# Patient Record
Sex: Female | Born: 1960
Health system: Southern US, Community
[De-identification: ages and names within clinical notes are randomized; demographics above are authoritative.]

## PROBLEM LIST (undated history)

## (undated) DIAGNOSIS — M130 Polyarthritis, unspecified: Secondary | ICD-10-CM

## (undated) DIAGNOSIS — R569 Unspecified convulsions: Secondary | ICD-10-CM

## (undated) DIAGNOSIS — F419 Anxiety disorder, unspecified: Secondary | ICD-10-CM

## (undated) DIAGNOSIS — M1990 Unspecified osteoarthritis, unspecified site: Secondary | ICD-10-CM

## (undated) DIAGNOSIS — B9689 Other specified bacterial agents as the cause of diseases classified elsewhere: Secondary | ICD-10-CM

## (undated) DIAGNOSIS — K449 Diaphragmatic hernia without obstruction or gangrene: Secondary | ICD-10-CM

## (undated) DIAGNOSIS — I639 Cerebral infarction, unspecified: Secondary | ICD-10-CM

## (undated) DIAGNOSIS — N760 Acute vaginitis: Secondary | ICD-10-CM

## (undated) DIAGNOSIS — R109 Unspecified abdominal pain: Secondary | ICD-10-CM

## (undated) DIAGNOSIS — E1142 Type 2 diabetes mellitus with diabetic polyneuropathy: Secondary | ICD-10-CM

## (undated) DIAGNOSIS — G8929 Other chronic pain: Secondary | ICD-10-CM

## (undated) DIAGNOSIS — K219 Gastro-esophageal reflux disease without esophagitis: Secondary | ICD-10-CM

## (undated) DIAGNOSIS — E785 Hyperlipidemia, unspecified: Secondary | ICD-10-CM

## (undated) DIAGNOSIS — K279 Peptic ulcer, site unspecified, unspecified as acute or chronic, without hemorrhage or perforation: Secondary | ICD-10-CM

## (undated) DIAGNOSIS — N898 Other specified noninflammatory disorders of vagina: Secondary | ICD-10-CM

## (undated) DIAGNOSIS — E039 Hypothyroidism, unspecified: Secondary | ICD-10-CM

## (undated) DIAGNOSIS — I10 Essential (primary) hypertension: Secondary | ICD-10-CM

## (undated) DIAGNOSIS — A599 Trichomoniasis, unspecified: Secondary | ICD-10-CM

## (undated) DIAGNOSIS — J449 Chronic obstructive pulmonary disease, unspecified: Secondary | ICD-10-CM

## (undated) HISTORY — DX: Chronic obstructive pulmonary disease, unspecified: J44.9

## (undated) HISTORY — PX: BLADDER REPAIR: SHX76

## (undated) HISTORY — DX: Cerebral infarction, unspecified: I63.9

## (undated) HISTORY — DX: Other specified noninflammatory disorders of vagina: N89.8

## (undated) HISTORY — DX: Type 2 diabetes mellitus with diabetic polyneuropathy: E11.42

## (undated) HISTORY — DX: Other specified bacterial agents as the cause of diseases classified elsewhere: B96.89

## (undated) HISTORY — DX: Acute vaginitis: N76.0

## (undated) HISTORY — DX: Gastro-esophageal reflux disease without esophagitis: K21.9

## (undated) HISTORY — PX: ESOPHAGOGASTRODUODENOSCOPY: SHX1529

## (undated) HISTORY — DX: Hyperlipidemia, unspecified: E78.5

## (undated) HISTORY — DX: Trichomoniasis, unspecified: A59.9

## (undated) HISTORY — DX: Peptic ulcer, site unspecified, unspecified as acute or chronic, without hemorrhage or perforation: K27.9

## (undated) HISTORY — PX: ABDOMINAL HYSTERECTOMY: SHX81

## (undated) HISTORY — PX: APPENDECTOMY: SHX54

## (undated) HISTORY — PX: TONSILLECTOMY: SUR1361

## (undated) HISTORY — DX: Polyarthritis, unspecified: M13.0

---

## 2000-12-05 ENCOUNTER — Inpatient Hospital Stay (HOSPITAL_COMMUNITY): Admission: EM | Admit: 2000-12-05 | Discharge: 2000-12-08 | Payer: Self-pay | Admitting: Emergency Medicine

## 2001-02-03 ENCOUNTER — Encounter: Payer: Self-pay | Admitting: Internal Medicine

## 2001-02-03 ENCOUNTER — Emergency Department (HOSPITAL_COMMUNITY): Admission: EM | Admit: 2001-02-03 | Discharge: 2001-02-03 | Payer: Self-pay | Admitting: Internal Medicine

## 2001-08-06 ENCOUNTER — Ambulatory Visit (HOSPITAL_COMMUNITY): Admission: RE | Admit: 2001-08-06 | Discharge: 2001-08-06 | Payer: Self-pay | Admitting: Pulmonary Disease

## 2001-10-12 ENCOUNTER — Ambulatory Visit (HOSPITAL_COMMUNITY): Admission: RE | Admit: 2001-10-12 | Discharge: 2001-10-12 | Payer: Self-pay | Admitting: Pulmonary Disease

## 2002-03-27 ENCOUNTER — Emergency Department (HOSPITAL_COMMUNITY): Admission: EM | Admit: 2002-03-27 | Discharge: 2002-03-27 | Payer: Self-pay | Admitting: Emergency Medicine

## 2002-04-01 ENCOUNTER — Encounter: Payer: Self-pay | Admitting: *Deleted

## 2002-04-01 ENCOUNTER — Ambulatory Visit (HOSPITAL_COMMUNITY): Admission: RE | Admit: 2002-04-01 | Discharge: 2002-04-01 | Payer: Self-pay | Admitting: Emergency Medicine

## 2002-04-15 ENCOUNTER — Ambulatory Visit (HOSPITAL_COMMUNITY): Admission: RE | Admit: 2002-04-15 | Discharge: 2002-04-15 | Payer: Self-pay | Admitting: *Deleted

## 2002-04-22 ENCOUNTER — Ambulatory Visit (HOSPITAL_COMMUNITY): Admission: RE | Admit: 2002-04-22 | Discharge: 2002-04-22 | Payer: Self-pay | Admitting: *Deleted

## 2002-04-25 ENCOUNTER — Encounter: Payer: Self-pay | Admitting: Emergency Medicine

## 2002-04-25 ENCOUNTER — Observation Stay (HOSPITAL_COMMUNITY): Admission: EM | Admit: 2002-04-25 | Discharge: 2002-04-26 | Payer: Self-pay | Admitting: Emergency Medicine

## 2002-06-30 ENCOUNTER — Observation Stay (HOSPITAL_COMMUNITY): Admission: RE | Admit: 2002-06-30 | Discharge: 2002-07-01 | Payer: Self-pay | Admitting: Obstetrics & Gynecology

## 2003-04-10 ENCOUNTER — Ambulatory Visit (HOSPITAL_COMMUNITY): Admission: RE | Admit: 2003-04-10 | Discharge: 2003-04-10 | Payer: Self-pay | Admitting: Pulmonary Disease

## 2003-06-16 ENCOUNTER — Ambulatory Visit (HOSPITAL_COMMUNITY): Admission: RE | Admit: 2003-06-16 | Discharge: 2003-06-16 | Payer: Self-pay | Admitting: Pulmonary Disease

## 2003-07-31 ENCOUNTER — Emergency Department (HOSPITAL_COMMUNITY): Admission: EM | Admit: 2003-07-31 | Discharge: 2003-07-31 | Payer: Self-pay | Admitting: Emergency Medicine

## 2003-11-09 ENCOUNTER — Emergency Department (HOSPITAL_COMMUNITY): Admission: EM | Admit: 2003-11-09 | Discharge: 2003-11-09 | Payer: Self-pay | Admitting: Emergency Medicine

## 2004-03-26 ENCOUNTER — Ambulatory Visit (HOSPITAL_COMMUNITY): Admission: RE | Admit: 2004-03-26 | Discharge: 2004-03-26 | Payer: Self-pay | Admitting: Pulmonary Disease

## 2004-04-12 ENCOUNTER — Ambulatory Visit (HOSPITAL_COMMUNITY): Admission: RE | Admit: 2004-04-12 | Discharge: 2004-04-12 | Payer: Self-pay | Admitting: Pulmonary Disease

## 2004-05-07 ENCOUNTER — Ambulatory Visit (HOSPITAL_COMMUNITY): Admission: RE | Admit: 2004-05-07 | Discharge: 2004-05-07 | Payer: Self-pay | Admitting: Pulmonary Disease

## 2004-06-18 ENCOUNTER — Emergency Department (HOSPITAL_COMMUNITY): Admission: EM | Admit: 2004-06-18 | Discharge: 2004-06-19 | Payer: Self-pay | Admitting: Emergency Medicine

## 2004-07-08 ENCOUNTER — Emergency Department (HOSPITAL_COMMUNITY): Admission: EM | Admit: 2004-07-08 | Discharge: 2004-07-08 | Payer: Self-pay | Admitting: Family Medicine

## 2004-07-26 ENCOUNTER — Ambulatory Visit (HOSPITAL_COMMUNITY): Admission: RE | Admit: 2004-07-26 | Discharge: 2004-07-26 | Payer: Self-pay | Admitting: Neurosurgery

## 2004-10-02 ENCOUNTER — Ambulatory Visit: Payer: Self-pay | Admitting: Orthopedic Surgery

## 2005-05-13 ENCOUNTER — Emergency Department (HOSPITAL_COMMUNITY): Admission: EM | Admit: 2005-05-13 | Discharge: 2005-05-13 | Payer: Self-pay | Admitting: Emergency Medicine

## 2005-05-20 ENCOUNTER — Emergency Department (HOSPITAL_COMMUNITY): Admission: EM | Admit: 2005-05-20 | Discharge: 2005-05-20 | Payer: Self-pay | Admitting: Emergency Medicine

## 2005-07-04 ENCOUNTER — Emergency Department (HOSPITAL_COMMUNITY): Admission: EM | Admit: 2005-07-04 | Discharge: 2005-07-05 | Payer: Self-pay | Admitting: *Deleted

## 2005-09-12 ENCOUNTER — Ambulatory Visit (HOSPITAL_COMMUNITY): Admission: RE | Admit: 2005-09-12 | Discharge: 2005-09-12 | Payer: Self-pay | Admitting: Pulmonary Disease

## 2005-11-02 ENCOUNTER — Emergency Department (HOSPITAL_COMMUNITY): Admission: EM | Admit: 2005-11-02 | Discharge: 2005-11-02 | Payer: Self-pay | Admitting: Emergency Medicine

## 2005-11-25 ENCOUNTER — Emergency Department (HOSPITAL_COMMUNITY): Admission: EM | Admit: 2005-11-25 | Discharge: 2005-11-25 | Payer: Self-pay | Admitting: Emergency Medicine

## 2006-02-15 ENCOUNTER — Emergency Department (HOSPITAL_COMMUNITY): Admission: EM | Admit: 2006-02-15 | Discharge: 2006-02-15 | Payer: Self-pay | Admitting: Emergency Medicine

## 2006-06-04 ENCOUNTER — Emergency Department (HOSPITAL_COMMUNITY): Admission: EM | Admit: 2006-06-04 | Discharge: 2006-06-04 | Payer: Self-pay | Admitting: Emergency Medicine

## 2006-07-01 ENCOUNTER — Inpatient Hospital Stay (HOSPITAL_COMMUNITY): Admission: EM | Admit: 2006-07-01 | Discharge: 2006-07-05 | Payer: Self-pay | Admitting: Emergency Medicine

## 2007-09-09 ENCOUNTER — Ambulatory Visit: Payer: Self-pay | Admitting: Psychiatry

## 2007-09-09 ENCOUNTER — Other Ambulatory Visit: Payer: Self-pay | Admitting: Emergency Medicine

## 2007-09-10 ENCOUNTER — Inpatient Hospital Stay (HOSPITAL_COMMUNITY): Admission: RE | Admit: 2007-09-10 | Discharge: 2007-09-21 | Payer: Self-pay | Admitting: Psychiatry

## 2007-09-11 ENCOUNTER — Encounter (HOSPITAL_COMMUNITY): Payer: Self-pay | Admitting: Psychiatry

## 2008-12-24 ENCOUNTER — Emergency Department (HOSPITAL_COMMUNITY): Admission: EM | Admit: 2008-12-24 | Discharge: 2008-12-24 | Payer: Self-pay | Admitting: Emergency Medicine

## 2009-02-02 ENCOUNTER — Emergency Department (HOSPITAL_COMMUNITY): Admission: EM | Admit: 2009-02-02 | Discharge: 2009-02-02 | Payer: Self-pay | Admitting: Emergency Medicine

## 2010-02-14 ENCOUNTER — Emergency Department (HOSPITAL_COMMUNITY): Admission: EM | Admit: 2010-02-14 | Discharge: 2010-02-14 | Payer: Self-pay | Admitting: Emergency Medicine

## 2010-03-26 ENCOUNTER — Ambulatory Visit: Payer: Self-pay | Admitting: Cardiovascular Disease

## 2010-03-26 ENCOUNTER — Encounter (INDEPENDENT_AMBULATORY_CARE_PROVIDER_SITE_OTHER): Payer: Self-pay | Admitting: *Deleted

## 2010-03-26 DIAGNOSIS — R079 Chest pain, unspecified: Secondary | ICD-10-CM

## 2010-03-26 DIAGNOSIS — F17200 Nicotine dependence, unspecified, uncomplicated: Secondary | ICD-10-CM

## 2010-03-26 DIAGNOSIS — R0602 Shortness of breath: Secondary | ICD-10-CM

## 2010-03-26 DIAGNOSIS — E782 Mixed hyperlipidemia: Secondary | ICD-10-CM

## 2010-03-27 ENCOUNTER — Encounter: Payer: Self-pay | Admitting: Cardiovascular Disease

## 2010-04-23 ENCOUNTER — Encounter (INDEPENDENT_AMBULATORY_CARE_PROVIDER_SITE_OTHER): Payer: Self-pay | Admitting: *Deleted

## 2010-04-28 ENCOUNTER — Emergency Department (HOSPITAL_COMMUNITY): Admission: EM | Admit: 2010-04-28 | Discharge: 2010-04-29 | Payer: Self-pay | Admitting: Emergency Medicine

## 2010-05-06 ENCOUNTER — Emergency Department (HOSPITAL_COMMUNITY): Admission: EM | Admit: 2010-05-06 | Discharge: 2010-05-06 | Payer: Self-pay | Admitting: Emergency Medicine

## 2010-05-23 ENCOUNTER — Encounter: Payer: Self-pay | Admitting: Cardiovascular Disease

## 2010-08-25 ENCOUNTER — Emergency Department (HOSPITAL_COMMUNITY)
Admission: EM | Admit: 2010-08-25 | Discharge: 2010-08-25 | Payer: Self-pay | Source: Home / Self Care | Admitting: Emergency Medicine

## 2010-10-12 ENCOUNTER — Encounter: Payer: Self-pay | Admitting: Pulmonary Disease

## 2010-10-12 ENCOUNTER — Encounter: Payer: Self-pay | Admitting: Orthopedic Surgery

## 2010-10-13 ENCOUNTER — Encounter: Payer: Self-pay | Admitting: Cardiovascular Disease

## 2010-10-13 ENCOUNTER — Encounter: Payer: Self-pay | Admitting: Pulmonary Disease

## 2010-10-13 ENCOUNTER — Encounter: Payer: Self-pay | Admitting: Obstetrics & Gynecology

## 2010-10-22 NOTE — Letter (Signed)
Summary: DR ZOXWR OFFICE NOTE 05/22/10  DR UEAVW OFFICE NOTE 05/22/10   Imported By: Faythe Ghee 05/23/2010 09:45:45  _____________________________________________________________________  External Attachment:    Type:   Image     Comment:   External Document

## 2010-10-22 NOTE — Letter (Signed)
Summary: labs dr. Lucianne Muss 03/19/10  labs dr. Lucianne Muss 03/19/10   Imported By: Faythe Ghee 03/27/2010 12:33:18  _____________________________________________________________________  External Attachment:    Type:   Image     Comment:   External Document

## 2010-10-22 NOTE — Letter (Signed)
Summary: [facility]  [address], [place] [address]   Phone: [phone]  Fax: [phone]     March 26, 2010 MRN: [id number]   EMIKO OSORTO [address] [place], [place]  [address]      YOUR LAB WORK IS DUE   June 26, 2010  Please go to [facility], located across the street from [facility] on the second floor.  Hours are Monday - Friday 7am until 7:30pm         Saturday 8am until 12noon    _X_  DO NOT EAT OR DRINK AFTER MIDNIGHT EVENING PRIOR TO LABWORK  __ YOUR LABWORK IS NOT FASTING --YOU MAY EAT PRIOR TO LABWORK

## 2010-10-22 NOTE — Letter (Signed)
Summary: DIABETES CONSULT  DIABETES CONSULT   Imported By: Faythe Ghee 03/27/2010 12:34:12  _____________________________________________________________________  External Attachment:    Type:   Image     Comment:   External Document

## 2010-10-22 NOTE — Letter (Signed)
Summary: Appointment - Missed  [facility] [place]  [address] [place] [address]   Phone: [phone]  Fax: [phone]     April 23, 2010 MRN: [id number]   GIZZELLE LACOMB [address] [place], [place]  [address]   Dear Ms. Villafranca,  Our records indicate you missed your appointment on Wednesday April 17, 2010 for Exercise Myoview.  It is very important that we reach you to reschedule this appointment. We look forward to participating in your health care needs. Please contact us at the number listed above at your earliest convenience to reschedule this appointment.     Sincerely,    Glass blower/designer

## 2010-10-22 NOTE — Letter (Signed)
Summary: [place] [facility] (Nuc Med Stress)  [facility]  [address], [place] [address]   Phone: [phone]  Fax: [phone]    Nuclear Medicine 1-Day Stress Test Information Sheet  Re:     [name]   DOB:     09-Jan-1961 MRN:     [id number] Weight:  Appointment Date: Register at: Appointment Time: Referring MD:  __x_Exercise Stress  __Adenosine   __Dobutamine  __Lexiscan  __Persantine   __Thallium  Urgency: ____1 (next day)   ____2 (one week)    ____3 (PRN)  Patient will receive Follow Up call with results: Patient needs follow-up appointment:  Instructions regarding medication:  How to prepare for your stress test: 1.NOTHING TO EAT OR DRINK AFTER MIDNIGHT ,DO NOT TAKE DIABETIC AM MEDS  2. DO NOT use any tobacco products for at leaset 8 hours prior to arrival. 3. DO NOT wear dresses or any clothing that may have metal clasps or buttons. 4. Wear short sleeve shirts, loose clothing, and comfortalbe walking shoes. 5. DO NOT use lotions, oils or powder on your chest before the test. 6. The test will take approximately 3-4 hours from the time you arrive until completion. 7. To register the day of the test, go to the Short Stay entrance at [facility] 8. If you must cancel your test, call [phone] as soon as you are aware.  After you arrive for test:   When you arrive at [facility], you will go to Short Stay to be registered. They will then send you to Radiology to check in. The Nuclear Medicine Tech will get you and start an IV in your arm or hand. A small amount of a radioactive tracer will then be injected into your IV. This tracer will then have to circulate for 30-45 minutes. During this time you will wait in the waiting room and you will be able to drink something without caffeine. A series of pictures will be taken of your heart follwoing this waiting period. After the 1st set of pictures you will go to the stress lab to get  ready for your stress test. During the stress test, another small amount of a radioactive tracer will be injected through your IV. When the stress test is complete, there is a short rest period while your heart rate and blood pressure will be monitored. When this monitoring period is complete you will have another set of pictrues taken. (The same as the 1st set of pictures). These pictures are taken between 15 minutes and 1 hour after the stress test. The time depends on the type of stress test you had. Your doctor will inform you of your test results within 7 days after test.    The possibilities of certain changes are possible during the test. They include abnormal blood pressure and disorders of the heart. Side effects of persantine or adenosine can include flushing, chest pain, shortness of breath, stomach tightness, headache and light-headedness. These side effects usually do not last long and are self-resolving. Every effort will be made to keep you comfortable and to minimize complications by obtaining a medical history and by close observation during the test. Emergency equipment, medications, and trained personnel are available to deal with any unusual situation which may arise.  Please notify office at least 48 hours in advance if you are unable to keep this appt.

## 2010-10-22 NOTE — Assessment & Plan Note (Signed)
Summary: NP6 CHERST PAIN   Visit Type:  Initial Consult Referring Provider:  Ines Bloomer Primary Provider:  Dr.Kumar  CC:  chest pain.  History of Present Illness: [name] is seen today at the request of Dr Lucianne Muss.  She is a smoker with elevated lipids, poorly controlled diabetes and chest pain.  She had a normal cath in 2002 but no subsequent evaluation.  Pain is atypical being sharp, not always exertional.  Associated with SOB, and likely COPD exacerbation.  Recent ER visity with R/O.  Last A1c was 9.3 and patient indicates poor diet.  Denies pleuritic pain, cough sputum palpitaitons edema or syncope.  LDL 6/11 92 with target in DM being less than 70.  Previously on statin but not now.  Smokes a pack every 2-3 days.  Counseled for less than 10 minutes regarding smoking cessation.  Limited motivatin to quit.  Long tem risk of worsening COPD and vascular disease discussed.  Current Problems (verified): None  Current Medications (verified): 1)  Nexium 40 Mg Cpdr (Esomeprazole Magnesium) .... Take 1 Tab Daily 2)  Alprazolam 1 Mg Tabs (Alprazolam) .... Take As Directed 3)  Depakote 500 Mg Tbec (Divalproex Sodium) .... Take 1 Tab Three Times A Day 4)  Synthroid 150 Mcg Tabs (Levothyroxine Sodium) .... Take 1 Tab Daily 5)  Trazodone Hcl 100 Mg Tabs (Trazodone Hcl) .... Take 1 Tab At Bedtime 6)  Abilify 5 Mg Tabs (Aripiprazole) .... Take 1 Tab Daily 7)  Lantus 100 Unit/ml Soln (Insulin Glargine) .Marland Kitchen.. 15 Units At Two Times A Day 8)  Lexapro 10 Mg Tabs (Escitalopram Oxalate) .... Take 1 Tab Daily 9)  Proair Hfa 108 (90 Base) Mcg/act Aers (Albuterol Sulfate) 10)  Pravastatin Sodium 20 Mg Tabs (Pravastatin Sodium) .... Take One Tablet By Mouth Daily At Bedtime 11)  Humalog Pen 100 Unit/ml Soln (Insulin Lispro (Human)) .... 5 Units Subcutaneously Ssi Three Times A Day 12)  Aspirin 81 Mg Tbec (Aspirin) .... Take One Tablet By Mouth Daily  Allergies (verified): 1)  ! * Phenytoin  Past  History:  Past Medical History: Last updated: 03/21/2010 hypothyroidism diabetes mellitus gerds hypercholesterolemia depression anxiety panic attacks seizure disorder  Past Surgical History: Last updated: 03/21/2010 cardiac cath 12/07/2000  Social History: Last updated: 03/21/2010 Disabled  Tobacco Use - Yes.  Alcohol Use - no Regular Exercise - no Drug Use - no  Review of Systems       Denies fever, malais, weight loss, blurry vision, decreased visual acuity, cough, sputum, SOB, hemoptysis, pleuritic pain, palpitaitons, heartburn, abdominal pain, melena, lower extremity edema, claudication, or rash.   Vital Signs:  Patient profile:   50 year old female Height:      63 inches Weight:      157 pounds BMI:     27.91 Pulse rate:   83 / minute BP sitting:   112 / 75  (right arm)  Vitals Entered By: Dreama Saa, CNA (March 26, 2010 1:06 PM)  Physical Exam  General:  Affect appropriate Healthy:  appears stated age HEENT: normal Neck supple with no adenopathy JVP normal no bruits no thyromegaly Lungs clear with no wheezing and good diaphragmatic motion Heart:  S1/S2 no murmur,rub, gallop or click PMI normal Abdomen: benighn, BS positve, no tenderness, no AAA no bruit.  No HSM or HJR Distal pulses intact with no bruits No edema Neuro non-focal Skin warm and dry    Impression & Recommendations:  Problem # 1:  DYSPNEA (ICD-786.05)  Related to lung  disease and smoking.  CXR done last month with no acute problems  Her updated medication list for this problem includes:    Aspirin 81 Mg Tbec (Aspirin) .Marland Kitchen... Take one tablet by mouth daily  Problem # 2:  CHEST PAIN UNSPECIFIED (ICD-786.50)  Atypical but multiple risk factors including poorly conrolled DM.  Stress myovue  Orders: Nuclear Stress Test (Nuc Stress Test)  Her updated medication list for this problem includes:    Aspirin 81 Mg Tbec (Aspirin) .Marland Kitchen... Take one tablet by mouth daily  Problem # 3:   MIXED HYPERLIPIDEMIA (ICD-272.2)  Start statin back.  With DM and multiple CRF';s target LDL is 70 or less  LFT;s in 3 months The following medications were removed from the medication list:    Simvastatin 40 Mg Tabs (Simvastatin) .Marland Kitchen... Take 1 tab daily Her updated medication list for this problem includes:    Pravastatin Sodium 20 Mg Tabs (Pravastatin sodium) .Marland Kitchen... Take one tablet by mouth daily at bedtime  The following medications were removed from the medication list:    Simvastatin 40 Mg Tabs (Simvastatin) .Marland Kitchen... Take 1 tab daily Her updated medication list for this problem includes:    Pravastatin Sodium 20 Mg Tabs (Pravastatin sodium) .Marland Kitchen... Take one tablet by mouth daily at bedtime  Future Orders: T-Lipid Profile ([id number]) ... 06/26/2010  Problem # 4:  SMOKER (ICD-305.1) F/U primary.  Reasonable candidate for Welburin.  Patient Instructions: 1)  Your physician recommends that you schedule a follow-up appointment in: 1 year 2)  Your physician recommends that you return for lab work in: 3 monhts 3)  Your physician has recommended you make the following change in your medication: pravastatin 20mg  daily 4)  Your physician has requested that you have an exercise stress myoview.  For further information please visit [email or web address]  Please follow instruction sheet, as given. Prescriptions: PRAVASTATIN SODIUM 20 MG TABS (PRAVASTATIN SODIUM) Take one tablet by mouth daily at bedtime  #30 x 3   Entered by:   Teressa Lower RN   Authorized by:   Colon Branch, MD, [facility]   Signed by:   Teressa Lower RN on 03/26/2010   Method used:   Electronically to        [facility] (retail)       [address]       [place], [place]  [address]       Ph: [phone]       Fax: [phone]   RxID:   [id number]   Prevention & Chronic Care Immunizations   Influenza vaccine: Not documented    Tetanus booster: Not documented    Pneumococcal  vaccine: Not documented  Colorectal Screening   Hemoccult: Not documented    Colonoscopy: Not documented  Other Screening   Pap smear: Not documented    Mammogram: Not documented   Smoking status: current  (03/21/2010)  Lipids   Total Cholesterol: Not documented   LDL: Not documented   LDL Direct: Not documented   HDL: Not documented   Triglycerides: Not documented    SGOT (AST): Not documented   SGPT (ALT): Not documented   Alkaline phosphatase: Not documented   Total bilirubin: Not documented  Self-Management Support :    Lipid self-management support: Not documented    EKG Report  Procedure date:  03/26/2010  Findings:      NSR 81 PAC Poor R wave progression

## 2010-12-02 LAB — BASIC METABOLIC PANEL
BUN: 12 mg/dL (ref 6–23)
CO2: 26 mEq/L (ref 19–32)
Calcium: 9.3 mg/dL (ref 8.4–10.5)
Chloride: 110 mEq/L (ref 96–112)
Creatinine, Ser: 0.67 mg/dL (ref 0.4–1.2)
Glucose, Bld: 63 mg/dL — ABNORMAL LOW (ref 70–99)
Sodium: 142 mEq/L (ref 135–145)

## 2010-12-02 LAB — URINALYSIS, ROUTINE W REFLEX MICROSCOPIC
Bilirubin Urine: NEGATIVE
Hgb urine dipstick: NEGATIVE
Nitrite: NEGATIVE
Urobilinogen, UA: 0.2 mg/dL (ref 0.0–1.0)
pH: 5.5 (ref 5.0–8.0)

## 2010-12-02 LAB — CBC
HCT: 38.7 % (ref 36.0–46.0)
Hemoglobin: 13.6 g/dL (ref 12.0–15.0)
MCHC: 35.1 g/dL (ref 30.0–36.0)
MCV: 89.6 fL (ref 78.0–100.0)
RDW: 13.6 % (ref 11.5–15.5)
WBC: 7.8 10*3/uL (ref 4.0–10.5)

## 2010-12-02 LAB — URINE MICROSCOPIC-ADD ON

## 2010-12-02 LAB — URINE CULTURE
Colony Count: 85000
Culture  Setup Time: 201112051240

## 2010-12-02 LAB — DIFFERENTIAL
Basophils Absolute: 0 10*3/uL (ref 0.0–0.1)
Basophils Relative: 1 % (ref 0–1)
Eosinophils Absolute: 0.1 10*3/uL (ref 0.0–0.7)
Eosinophils Relative: 2 % (ref 0–5)
Lymphocytes Relative: 33 % (ref 12–46)
Lymphs Abs: 2.6 10*3/uL (ref 0.7–4.0)

## 2010-12-05 LAB — URINALYSIS, ROUTINE W REFLEX MICROSCOPIC
Nitrite: POSITIVE — AB
Protein, ur: NEGATIVE mg/dL
pH: 6 (ref 5.0–8.0)

## 2010-12-05 LAB — URINE CULTURE
Colony Count: >=100000
Culture  Setup Time: 201108151712

## 2010-12-05 LAB — URINE MICROSCOPIC-ADD ON

## 2010-12-06 LAB — DIFFERENTIAL
Basophils Relative: 1 % (ref 0–1)
Eosinophils Absolute: 0.2 10*3/uL (ref 0.0–0.7)
Eosinophils Relative: 2 % (ref 0–5)
Lymphocytes Relative: 31 % (ref 12–46)
Lymphs Abs: 2.2 10*3/uL (ref 0.7–4.0)
Monocytes Absolute: 0.7 10*3/uL (ref 0.1–1.0)
Monocytes Relative: 10 % (ref 3–12)
Neutro Abs: 4 10*3/uL (ref 1.7–7.7)

## 2010-12-06 LAB — BASIC METABOLIC PANEL
BUN: 11 mg/dL (ref 6–23)
Calcium: 8.7 mg/dL (ref 8.4–10.5)
Chloride: 103 mEq/L (ref 96–112)
Creatinine, Ser: 0.82 mg/dL (ref 0.4–1.2)
GFR calc non Af Amer: >60 mL/min (ref >60)
Glucose, Bld: 290 mg/dL — ABNORMAL HIGH (ref 70–99)
Potassium: 3.8 mEq/L (ref 3.5–5.1)
Sodium: 136 mEq/L (ref 135–145)

## 2010-12-06 LAB — CBC
MCH: 31.5 pg (ref 26.0–34.0)
MCHC: 34.3 g/dL (ref 30.0–36.0)
Platelets: 269 10*3/uL (ref 150–400)
RBC: 4.32 MIL/uL (ref 3.87–5.11)
RDW: 12.9 % (ref 11.5–15.5)

## 2010-12-09 LAB — POCT CARDIAC MARKERS
CKMB, poc: <1 ng/mL — ABNORMAL LOW (ref 1.0–8.0)
Myoglobin, poc: 56 ng/mL (ref 12–200)
Myoglobin, poc: 61.2 ng/mL (ref 12–200)
Troponin i, poc: <0.05 ng/mL (ref 0.00–0.09)

## 2010-12-09 LAB — DIFFERENTIAL
Eosinophils Relative: 1 % (ref 0–5)
Lymphocytes Relative: 29 % (ref 12–46)
Monocytes Absolute: 0.6 10*3/uL (ref 0.1–1.0)
Neutro Abs: 4.6 10*3/uL (ref 1.7–7.7)
Neutrophils Relative %: 62 % (ref 43–77)

## 2010-12-09 LAB — BASIC METABOLIC PANEL
BUN: 8 mg/dL (ref 6–23)
CO2: 26 mEq/L (ref 19–32)
Calcium: 8.8 mg/dL (ref 8.4–10.5)
Chloride: 103 mEq/L (ref 96–112)
GFR calc Af Amer: >60 mL/min (ref >60)
GFR calc non Af Amer: >60 mL/min (ref >60)

## 2010-12-09 LAB — CBC
Hemoglobin: 12.6 g/dL (ref 12.0–15.0)
RDW: 13.5 % (ref 11.5–15.5)

## 2010-12-09 LAB — HEPATIC FUNCTION PANEL
AST: 18 U/L (ref 0–37)
Bilirubin, Direct: 0.1 mg/dL (ref 0.0–0.3)
Indirect Bilirubin: 0.2 mg/dL — ABNORMAL LOW (ref 0.3–0.9)
Total Bilirubin: 0.3 mg/dL (ref 0.3–1.2)

## 2010-12-09 LAB — GLUCOSE, CAPILLARY: Glucose-Capillary: 250 mg/dL — ABNORMAL HIGH (ref 70–99)

## 2010-12-09 LAB — LIPASE, BLOOD: Lipase: 16 U/L (ref 11–59)

## 2010-12-09 LAB — D-DIMER, QUANTITATIVE: D-Dimer, Quant: 0.27 ug/mL-FEU (ref 0.00–0.48)

## 2010-12-24 ENCOUNTER — Emergency Department (HOSPITAL_COMMUNITY): Payer: Medicare Other

## 2010-12-24 ENCOUNTER — Emergency Department (HOSPITAL_COMMUNITY)
Admission: EM | Admit: 2010-12-24 | Discharge: 2010-12-24 | Disposition: A | Discharge status: Home / Self Care | Payer: Medicare Other | Attending: Emergency Medicine | Admitting: Emergency Medicine

## 2010-12-24 DIAGNOSIS — M545 Low back pain, unspecified: Secondary | ICD-10-CM | POA: Insufficient documentation

## 2010-12-24 DIAGNOSIS — E039 Hypothyroidism, unspecified: Secondary | ICD-10-CM | POA: Insufficient documentation

## 2010-12-24 DIAGNOSIS — W108XXA Fall (on) (from) other stairs and steps, initial encounter: Secondary | ICD-10-CM | POA: Insufficient documentation

## 2010-12-24 DIAGNOSIS — T07XXXA Unspecified multiple injuries, initial encounter: Secondary | ICD-10-CM | POA: Insufficient documentation

## 2010-12-24 DIAGNOSIS — R0989 Other specified symptoms and signs involving the circulatory and respiratory systems: Secondary | ICD-10-CM | POA: Insufficient documentation

## 2010-12-24 DIAGNOSIS — Y92009 Unspecified place in unspecified non-institutional (private) residence as the place of occurrence of the external cause: Secondary | ICD-10-CM | POA: Insufficient documentation

## 2010-12-24 DIAGNOSIS — G40909 Epilepsy, unspecified, not intractable, without status epilepticus: Secondary | ICD-10-CM | POA: Insufficient documentation

## 2010-12-24 DIAGNOSIS — E1169 Type 2 diabetes mellitus with other specified complication: Secondary | ICD-10-CM | POA: Insufficient documentation

## 2010-12-24 DIAGNOSIS — R0609 Other forms of dyspnea: Secondary | ICD-10-CM | POA: Insufficient documentation

## 2010-12-24 DIAGNOSIS — M25529 Pain in unspecified elbow: Secondary | ICD-10-CM | POA: Insufficient documentation

## 2010-12-24 LAB — BASIC METABOLIC PANEL
BUN: 15 mg/dL (ref 6–23)
GFR calc Af Amer: 39 mL/min — ABNORMAL LOW (ref >60)
GFR calc non Af Amer: 32 mL/min — ABNORMAL LOW (ref >60)
Potassium: 3.4 mEq/L — ABNORMAL LOW (ref 3.5–5.1)

## 2010-12-24 LAB — GLUCOSE, CAPILLARY: Glucose-Capillary: 475 mg/dL — ABNORMAL HIGH (ref 70–99)

## 2010-12-30 ENCOUNTER — Emergency Department (HOSPITAL_COMMUNITY): Payer: Medicare Other

## 2010-12-30 ENCOUNTER — Inpatient Hospital Stay (HOSPITAL_COMMUNITY)
Admission: EM | Admit: 2010-12-30 | Discharge: 2011-01-05 | DRG: 689 | Disposition: A | Discharge status: Home / Self Care | Payer: Medicare Other | Attending: Emergency Medicine | Admitting: Emergency Medicine

## 2010-12-30 DIAGNOSIS — J441 Chronic obstructive pulmonary disease with (acute) exacerbation: Secondary | ICD-10-CM | POA: Diagnosis present

## 2010-12-30 DIAGNOSIS — J189 Pneumonia, unspecified organism: Secondary | ICD-10-CM | POA: Diagnosis present

## 2010-12-30 DIAGNOSIS — F329 Major depressive disorder, single episode, unspecified: Secondary | ICD-10-CM | POA: Diagnosis present

## 2010-12-30 DIAGNOSIS — K219 Gastro-esophageal reflux disease without esophagitis: Secondary | ICD-10-CM | POA: Diagnosis present

## 2010-12-30 DIAGNOSIS — N390 Urinary tract infection, site not specified: Principal | ICD-10-CM | POA: Diagnosis present

## 2010-12-30 DIAGNOSIS — R339 Retention of urine, unspecified: Secondary | ICD-10-CM | POA: Diagnosis present

## 2010-12-30 DIAGNOSIS — F3289 Other specified depressive episodes: Secondary | ICD-10-CM | POA: Diagnosis present

## 2010-12-30 DIAGNOSIS — E039 Hypothyroidism, unspecified: Secondary | ICD-10-CM | POA: Diagnosis present

## 2010-12-30 DIAGNOSIS — IMO0001 Reserved for inherently not codable concepts without codable children: Secondary | ICD-10-CM | POA: Diagnosis present

## 2010-12-30 DIAGNOSIS — F411 Generalized anxiety disorder: Secondary | ICD-10-CM | POA: Diagnosis present

## 2010-12-30 LAB — GLUCOSE, CAPILLARY
Glucose-Capillary: 414 mg/dL — ABNORMAL HIGH (ref 70–99)
Glucose-Capillary: 169 mg/dL — ABNORMAL HIGH (ref 70–99)
Glucose-Capillary: 365 mg/dL — ABNORMAL HIGH (ref 70–99)

## 2010-12-30 LAB — CBC
MCH: 30.5 pg (ref 26.0–34.0)
MCV: 89.5 fL (ref 78.0–100.0)
Platelets: 237 10*3/uL (ref 150–400)
RDW: 13 % (ref 11.5–15.5)

## 2010-12-30 LAB — BASIC METABOLIC PANEL
BUN: 11 mg/dL (ref 6–23)
Calcium: 8.7 mg/dL (ref 8.4–10.5)
Creatinine, Ser: 0.81 mg/dL (ref 0.4–1.2)
GFR calc non Af Amer: >60 mL/min (ref >60)

## 2010-12-30 LAB — DIFFERENTIAL
Eosinophils Absolute: 0 10*3/uL (ref 0.0–0.7)
Eosinophils Relative: 0 % (ref 0–5)
Lymphs Abs: 1.4 10*3/uL (ref 0.7–4.0)
Monocytes Relative: 11 % (ref 3–12)

## 2010-12-30 LAB — URINALYSIS, ROUTINE W REFLEX MICROSCOPIC
Ketones, ur: 15 mg/dL — AB
Leukocytes, UA: NEGATIVE
Nitrite: POSITIVE — AB
Protein, ur: NEGATIVE mg/dL
pH: 5.5 (ref 5.0–8.0)

## 2010-12-30 LAB — URINE MICROSCOPIC-ADD ON

## 2010-12-31 LAB — CBC
HCT: 38.7 % (ref 36.0–46.0)
Hemoglobin: 13.5 g/dL (ref 12.0–15.0)
RDW: 13.2 % (ref 11.5–15.5)
WBC: 13.8 10*3/uL — ABNORMAL HIGH (ref 4.0–10.5)

## 2010-12-31 LAB — GLUCOSE, CAPILLARY
Glucose-Capillary: 101 mg/dL — ABNORMAL HIGH (ref 70–99)
Glucose-Capillary: 63 mg/dL — ABNORMAL LOW (ref 70–99)
Glucose-Capillary: 132 mg/dL — ABNORMAL HIGH (ref 70–99)

## 2010-12-31 LAB — RAPID URINE DRUG SCREEN, HOSP PERFORMED
Amphetamines: NOT DETECTED
Opiates: NOT DETECTED
Tetrahydrocannabinol: NOT DETECTED

## 2010-12-31 LAB — URINALYSIS, ROUTINE W REFLEX MICROSCOPIC
Nitrite: NEGATIVE
Protein, ur: NEGATIVE mg/dL
Specific Gravity, Urine: <1.005 — ABNORMAL LOW (ref 1.005–1.030)
Urobilinogen, UA: 0.2 mg/dL (ref 0.0–1.0)

## 2010-12-31 LAB — HEPATIC FUNCTION PANEL
AST: 13 U/L (ref 0–37)
Albumin: 2.7 g/dL — ABNORMAL LOW (ref 3.5–5.2)
Bilirubin, Direct: 0.1 mg/dL (ref 0.0–0.3)
Total Protein: 5.2 g/dL — ABNORMAL LOW (ref 6.0–8.3)

## 2010-12-31 LAB — DIFFERENTIAL
Basophils Absolute: 0 10*3/uL (ref 0.0–0.1)
Basophils Relative: 0 % (ref 0–1)
Eosinophils Absolute: 0 10*3/uL (ref 0.0–0.7)
Eosinophils Relative: 0 % (ref 0–5)
Lymphocytes Relative: 9 % — ABNORMAL LOW (ref 12–46)
Lymphs Abs: 1.3 10*3/uL (ref 0.7–4.0)
Monocytes Absolute: 1.1 10*3/uL — ABNORMAL HIGH (ref 0.1–1.0)
Monocytes Relative: 8 % (ref 3–12)
Neutro Abs: 11.3 10*3/uL — ABNORMAL HIGH (ref 1.7–7.7)
Neutrophils Relative %: 83 % — ABNORMAL HIGH (ref 43–77)

## 2010-12-31 LAB — TSH: TSH: 2.205 u[IU]/mL (ref 0.350–4.500)

## 2010-12-31 LAB — COMPREHENSIVE METABOLIC PANEL
ALT: 20 U/L (ref 0–35)
Albumin: 3.1 g/dL — ABNORMAL LOW (ref 3.5–5.2)
Alkaline Phosphatase: 92 U/L (ref 39–117)
BUN: 7 mg/dL (ref 6–23)
Chloride: 101 mEq/L (ref 96–112)
Glucose, Bld: 191 mg/dL — ABNORMAL HIGH (ref 70–99)
Potassium: 3.4 mEq/L — ABNORMAL LOW (ref 3.5–5.1)
Sodium: 134 mEq/L — ABNORMAL LOW (ref 135–145)
Total Bilirubin: 0.7 mg/dL (ref 0.3–1.2)
Total Protein: 6.6 g/dL (ref 6.0–8.3)

## 2010-12-31 LAB — LIPID PANEL
HDL: 46 mg/dL (ref >39)
Total CHOL/HDL Ratio: 2.7 RATIO

## 2010-12-31 LAB — URINE MICROSCOPIC-ADD ON

## 2010-12-31 NOTE — H&P (Signed)
NAMEZERA, MARKWARDT              ACCOUNT NO.:  [id number]  MEDICAL RECORD NO.:  [id number]           PATIENT TYPE:  O  LOCATION:  A337                          FACILITY:  [facility]  PHYSICIAN:  Rosanna Randy, MDDATE OF BIRTH:  Dec 31, 1960  DATE OF ADMISSION:  12/30/2010 DATE OF DISCHARGE:  LH                             HISTORY & PHYSICAL   PRIMARY CARE PHYSICIAN:  [facility], followed by Dr. Vinnie Langton at this practice.  CHIEF COMPLAINT:  Dysuria, fever and cough.  The patient will be followed by [facility].  HISTORY OF PRESENT ILLNESS:  [name] is a 50 year old female with a past medical history significant for diabetes, hypothyroidism, hyperlipidemia, GERD and anxiety who comes into the hospital complaining of nausea, abdominal discomfort in her suprapubic area, dysphoria and also cough.  According to the patient, the symptoms have been going on since last week on Tuesday where she actually was seen in the emergency department, was diagnosed with just hyperglycemia and most likely allergic rhinitis and was discharged home.  The patient reports that her symptoms continue worsening since that period of time to the point that she is now having some difficulty keeping things down and coughing all the time with mild shortness of breath.  The patient reports that on Sunday she had a fever of 103.2.  While in the emergency department, the patient has a chest x-ray that demonstrated changes consistent with COPD and right medial lung base focal changes concerns for pneumonia.  She also have a white blood cells of 10.0 and a urinalysis with a positive nitrite and too numerous to count white blood cells with many bacteria. [facility] was called to admit the patient for further evaluation and treatment what it looks to be a combination of UTI and community-acquired pneumonia.  Of note, the patient was also found to  be hyperglycemic with a blood sugar in the 400 range.  ALLERGIES:  The patient is allergic to Dilantin and the allergic reaction to this medication is a Stevens-Johnson syndrome.  PAST MEDICAL HISTORY:  As mentioned above include hypothyroidism, diabetes, gastroesophageal reflux disease, anxiety, hyperlipidemia, tobacco abuse and COPD that had been diagnosed by x-ray, but no formal PFTs done.  MEDICATIONS:  The patient reports Lantus 18 units twice a day, sliding scale insulin with NovoLog using around 10-15 units three times a day with meals, Synthroid 150 mcg, Nexium 40 mg by mouth daily, Xanax 1 mg four times a day and p.r.n. Tylenol.  The patient did not remember what is the medications that she used for her cholesterol and at the moment of this dictation, doses and medications have not been yet confirmed. Pharmacy is working on the medication reconciliation and as soon as we will go ahead and continue, the patient's home meds.  SOCIAL HISTORY:  The patient lives in [place] with a roommate.  She is divorced.  Have one children, currently on disability secondary to anxiety and diabetes.  She smoked about a pack weekly according to her, sometimes a little bit more and she has been smoking over the last  20 years.  She denies alcohol or illicit drugs.  FAMILY HISTORY:  Father with heart problems, diabetes and hypertension. Her mother with a history of tobacco abuse who had developed lung cancer with metastases to her liver, otherwise noncontributory.  REVIEW OF SYSTEMS:  Positive for dysuria, fever, chills, cough with a whitish sputum production, mild wheezing, otherwise as already mentioned on her HPI.  Of note, the patient denies any sick contacts, any long trips or any new medications.  PHYSICAL EXAM:  VITAL SIGNS:  Temperature 98.6, heart rate 76, respiratory rate 16, blood pressure 107/67, oxygen saturation 91% on room air. GENERAL:  The patient was in no acute distress,  but mildly uncomfortable secondary to general malaise and back pain due to being coughing so much according to her. HEENT:  Normocephalic, atraumatic eyes.  PERRLA.  Extraocular muscles intact.  No icterus.  No nystagmus.  There was no exudates or erythema appreciated.  Inside her mouth, there is a poor dentition with upper dentures in place.  The patient have dry mucous membranes. NECK:  Supple.  No bruits, thyromegaly. RESPIRATORY:  Mild wheezing.  End-expiratory wheezing.  Scattered rhonchi.  Good air movement. HEART:  Regular rate and rhythm.  No murmurs, gallops or rubs. ABDOMEN:  Soft, nontender, nondistended.  Positive bowel sounds. EXTREMITIES:  No edema, cyanosis or clubbing. NEUROLOGIC:  The patient was alert, awake and oriented x3.  Muscle strength 5/5.  Cranial nerves II-XII intact.  Gait was normal.  Normal finger-to-nose.  Deep tendon reflexes light touch and proprioception were intact and normal bilaterally and symmetrically. PSYCH:  Appropriate.  PERTINENT LABORATORY DATA:  The patient has a BMET with a sodium of 134, potassium 3.6, chloride 102, bicarb 24, blood sugar 411, BUN 11, creatinine 0.81, calcium 8.7.  Had a CBC with a white blood cells of 10.0, hemoglobin 13.1, platelets 237.  Urine microscopy showing too numerous to count white blood cells, many bacteria and a urinalysis, which was positive for more than 1000 urine glucose, 15 ketones and positive nitrites.  The patient also had a chest x-ray that demonstrated subtle right median base findings concerns for early pneumonia.  There are also changes in her x-ray that are consistently with hyper-aerated lungs and chronic obstructive pulmonary disease.  ASSESSMENT AND PLAN: 1. UTI.  We are going to provide tx with Rocephin as an antibiotic and     we are going to check the patient's urine culture. 2. Community adquired pneumonia: with the cough, general malaise and     findings on x-ray.  We are going to add  azithromycin and she will     be receiving Rocephin for her urinary tract infection, this will     cover pretty well with community-acquired pneumonia.  The patient     will receive anti-cough (Robitussin).  We are going to use low dose      morphine for pain and while she is febrile, we are     going to put the patient on droplets precautions at this time as     protocol. 3.  DM/hyperglycemia: The patient received a short course of Glucommander      while in the emergency department at this moment her CBG was 250.       We are going to start the patient on a moderate sliding scale insulin.       Will continue with her Lantus 18 units subcutaneously twice a day and will check a     hemoglobin A1c and  depending her CBGs, we will adjust her     medications.  The patient had been placed on a modify carbohydrates diet. 4. Gastroesophageal reflux disease: We are going to continue her Nexium. 5. Hypothyroidism: we are going to check a TSH and we are going to continue her Synthroid. 6. Hx of hyperlipidemia: we are going to check a fasting lipid profile and     depending findings, we are going to start her on statins. 7. Anxiety: plan is to continue using her Xanax as prescribed. 8. Dpression/insomnia: we are going to start the patient on her trazodone 100mg     by mouth daily at bedtime.     Further test workup and treatment will be provided depending improvement to the  patient's symptoms.     Rosanna Randy, MD     CEM/MEDQ  D:  12/30/2010  T:  12/30/2010  Job:  [id number]  cc:   Vinnie Langton, MD  Reather Littler, M.D. Fax: [phone]  Electronically Signed by Vassie Loll MD on 12/31/2010 12:02:17 PM

## 2011-01-01 ENCOUNTER — Inpatient Hospital Stay (HOSPITAL_COMMUNITY): Payer: Medicare Other

## 2011-01-01 LAB — COMPREHENSIVE METABOLIC PANEL
Albumin: 3.4 g/dL — ABNORMAL LOW (ref 3.5–5.2)
BUN: 10 mg/dL (ref 6–23)
Creatinine, Ser: 0.84 mg/dL (ref 0.4–1.2)
Potassium: 3.2 mEq/L — ABNORMAL LOW (ref 3.5–5.1)
Total Protein: 5.9 g/dL — ABNORMAL LOW (ref 6.0–8.3)

## 2011-01-01 LAB — CBC
HCT: 39.4 % (ref 36.0–46.0)
MCH: 30.4 pg (ref 26.0–34.0)
MCHC: 32.9 g/dL (ref 30.0–36.0)
MCV: 92.4 fL (ref 78.0–100.0)
MCV: 92.3 fL (ref 78.0–100.0)
Platelets: 244 10*3/uL (ref 150–400)
Platelets: 210 10*3/uL (ref 150–400)
RBC: 4.34 MIL/uL (ref 3.87–5.11)
RDW: 13.2 % (ref 11.5–15.5)
RDW: 13.6 % (ref 11.5–15.5)

## 2011-01-01 LAB — URINALYSIS, ROUTINE W REFLEX MICROSCOPIC
Ketones, ur: NEGATIVE mg/dL
Leukocytes, UA: NEGATIVE
Nitrite: POSITIVE — AB
Protein, ur: NEGATIVE mg/dL

## 2011-01-01 LAB — BASIC METABOLIC PANEL
BUN: 6 mg/dL (ref 6–23)
Calcium: 9 mg/dL (ref 8.4–10.5)
Chloride: 105 mEq/L (ref 96–112)
Creatinine, Ser: 0.72 mg/dL (ref 0.4–1.2)

## 2011-01-01 LAB — DIFFERENTIAL
Eosinophils Absolute: 0.1 10*3/uL (ref 0.0–0.7)
Eosinophils Relative: 1 % (ref 0–5)
Lymphocytes Relative: 20 % (ref 12–46)
Lymphs Abs: 2.2 10*3/uL (ref 0.7–4.0)
Lymphs Abs: 2 10*3/uL (ref 0.7–4.0)
Monocytes Absolute: 0.8 10*3/uL (ref 0.1–1.0)
Monocytes Absolute: 0.9 10*3/uL (ref 0.1–1.0)
Monocytes Relative: 9 % (ref 3–12)
Monocytes Relative: 10 % (ref 3–12)
Neutro Abs: 6.7 10*3/uL (ref 1.7–7.7)

## 2011-01-01 LAB — LIPASE, BLOOD: Lipase: 14 U/L (ref 11–59)

## 2011-01-01 LAB — GLUCOSE, CAPILLARY
Glucose-Capillary: 153 mg/dL — ABNORMAL HIGH (ref 70–99)
Glucose-Capillary: 390 mg/dL — ABNORMAL HIGH (ref 70–99)
Glucose-Capillary: 86 mg/dL (ref 70–99)
Glucose-Capillary: 91 mg/dL (ref 70–99)

## 2011-01-01 LAB — URINE CULTURE
Colony Count: NO GROWTH
Culture: NO GROWTH

## 2011-01-02 LAB — BASIC METABOLIC PANEL
BUN: 9 mg/dL (ref 6–23)
Chloride: 98 mEq/L (ref 96–112)
Creatinine, Ser: 0.91 mg/dL (ref 0.4–1.2)
GFR calc non Af Amer: >60 mL/min (ref >60)
Glucose, Bld: 257 mg/dL — ABNORMAL HIGH (ref 70–99)

## 2011-01-02 LAB — CBC
MCHC: 33.1 g/dL (ref 30.0–36.0)
MCV: 92 fL (ref 78.0–100.0)
Platelets: 221 10*3/uL (ref 150–400)
RDW: 12.9 % (ref 11.5–15.5)
WBC: 5.4 10*3/uL (ref 4.0–10.5)

## 2011-01-02 LAB — GLUCOSE, CAPILLARY: Glucose-Capillary: 106 mg/dL — ABNORMAL HIGH (ref 70–99)

## 2011-01-02 LAB — DIFFERENTIAL
Basophils Relative: 1 % (ref 0–1)
Eosinophils Absolute: 0.2 10*3/uL (ref 0.0–0.7)
Eosinophils Relative: 3 % (ref 0–5)
Monocytes Relative: 12 % (ref 3–12)
Neutrophils Relative %: 40 % — ABNORMAL LOW (ref 43–77)

## 2011-01-03 LAB — URINALYSIS, ROUTINE W REFLEX MICROSCOPIC
Leukocytes, UA: NEGATIVE
Nitrite: NEGATIVE
Protein, ur: NEGATIVE mg/dL
Specific Gravity, Urine: 1.01 (ref 1.005–1.030)
Urobilinogen, UA: 0.2 mg/dL (ref 0.0–1.0)

## 2011-01-03 LAB — GLUCOSE, CAPILLARY
Glucose-Capillary: 341 mg/dL — ABNORMAL HIGH (ref 70–99)
Glucose-Capillary: 207 mg/dL — ABNORMAL HIGH (ref 70–99)
Glucose-Capillary: 210 mg/dL — ABNORMAL HIGH (ref 70–99)
Glucose-Capillary: 115 mg/dL — ABNORMAL HIGH (ref 70–99)

## 2011-01-03 LAB — URINE MICROSCOPIC-ADD ON

## 2011-01-04 LAB — DIFFERENTIAL
Basophils Absolute: 0.1 10*3/uL (ref 0.0–0.1)
Eosinophils Absolute: 0.1 10*3/uL (ref 0.0–0.7)
Lymphocytes Relative: 45 % (ref 12–46)
Monocytes Relative: 8 % (ref 3–12)
Neutro Abs: 2.8 10*3/uL (ref 1.7–7.7)

## 2011-01-04 LAB — GLUCOSE, CAPILLARY
Glucose-Capillary: 61 mg/dL — ABNORMAL LOW (ref 70–99)
Glucose-Capillary: 160 mg/dL — ABNORMAL HIGH (ref 70–99)
Glucose-Capillary: 332 mg/dL — ABNORMAL HIGH (ref 70–99)

## 2011-01-04 LAB — BASIC METABOLIC PANEL
BUN: 12 mg/dL (ref 6–23)
GFR calc non Af Amer: >60 mL/min (ref >60)
Glucose, Bld: 345 mg/dL — ABNORMAL HIGH (ref 70–99)
Potassium: 4.6 mEq/L (ref 3.5–5.1)

## 2011-01-04 LAB — CBC
Hemoglobin: 13.1 g/dL (ref 12.0–15.0)
MCH: 30.5 pg (ref 26.0–34.0)
MCHC: 33.3 g/dL (ref 30.0–36.0)
RBC: 4.29 MIL/uL (ref 3.87–5.11)
RDW: 12.8 % (ref 11.5–15.5)

## 2011-01-05 LAB — URINE CULTURE
Colony Count: NO GROWTH
Culture  Setup Time: 201204140110

## 2011-01-05 LAB — GLUCOSE, CAPILLARY: Glucose-Capillary: 82 mg/dL (ref 70–99)

## 2011-01-23 NOTE — Discharge Summary (Signed)
[name], [name]              ACCOUNT NO.:  [id number]  MEDICAL RECORD NO.:  [id number]           PATIENT TYPE:  I  LOCATION:  A337                          FACILITY:  [facility]  PHYSICIAN:  Aimar Shrewsbury, DO         DATE OF BIRTH:  05/16/1961  DATE OF ADMISSION:  12/30/2010 DATE OF DISCHARGE:  LH                              DISCHARGE SUMMARY   ADMISSION DIAGNOSES:  Urinary tract infection, community-acquired pneumonia, diabetes mellitus, hyperglycemia, gastroesophageal reflux disease, hypothyroidism, hyperlipidemia, anxiety, depression and insomnia.  HISTORY OF PRESENT ILLNESS:  Please see H and P.  HOSPITAL COURSE:  The patient was admitted.  She was placed on IV Rocephin.  As in December, the patient was found to have a urinary tract infection which was actually grew out Acetobacter which was sensitive to Rocephin.  She was also given Levaquin breathing treatments and sliding scale insulin.  She was continued on her Lantus.  She was given pain control as she had quite a bit of pleuritic chest pain and she was continued on her own home anxiety medications and another medications. The patient has been slow to improve with regard to her wheezing.  She actually sounded quite a bit better yesterday and I was going to send her home, but she developed urinary retention.  A Foley catheter had been placed overnight.  I started the patient on Flomax to remove the Foley catheter.  She had q4 hour bladder scans, was in order to I and O catheter if necessary.  Today, the patient is urinating on her own without trouble.  Today, the patient states that she is not feeling well.  I told her what is expected after pneumonia and her UTI that she will not feel well for a while, but she actually is doing okay and is fine to go home.  We will ambulate her in the halls of course before she is discharged.  Discharge instructions include activity as tolerated.  Diet is 1800 calorie  ADA.  Medications at home include: 1. Albuterol inhaler 1-2 puffs q.6 h. and q.2 h. p.r.n. dyspnea. 2. DuoNebs one inhalational nebulizer treatment q.6 h. and p.r.n.     dyspnea. 3. Flora-Q 1 tablet daily x10 days. 4. Robitussin DM 5 mL q.4 h. p.r.n. cough. 5. Levaquin 750 mg one p.o. daily x4 days. 6. Nicotine patch one transdermal daily. 7. Zofran 4 mg one p.o. q.6 h. p.r.n. nausea. 8. Oxycodone 5 mg one p.o. q.4 h. p.r.n. pain. 9. The patient will be on a prednisone taper 20 mg one p.o. b.i.d. x3     days, then one p.o. daily x3 days and one half p.o. daily x3 days     and then stop. 10.Flomax 0.4 mg one p.o. daily. 11.Lantus 18 units subcu b.i.d. 12.Nexium 40 mg one p.o. daily. 13.NovoLog insulin sliding scale as previously prescribed. 14.Synthroid 150 mcg one p.o. daily. 15.Trazodone 100 mg one p.o. nightly. 16.Xanax 1 mg one p.o. q.6 h. p.r.n. anxiety.  The patient is also to stop smoking.  This is very important.  DISCHARGE DIAGNOSES:  Pneumonia, exacerbation of chronic obstructive pulmonary  disease, diabetes mellitus type 2, urinary retention, urinary tract infection, hypothyroidism and depression and anxiety.  The patient is to follow up with Dr. Vinnie Langton at [facility] within 2 weeks.                                           ______________________________ Fran Lowes, DO     AS/MEDQ  D:  01/04/2011  T:  01/05/2011  Job:  [id number]  Electronically Signed by Fran Lowes DO on 01/23/2011 06:06:43 PM

## 2011-01-29 ENCOUNTER — Emergency Department (HOSPITAL_COMMUNITY): Payer: Medicare Other

## 2011-01-29 ENCOUNTER — Emergency Department (HOSPITAL_COMMUNITY)
Admission: EM | Admit: 2011-01-29 | Discharge: 2011-01-29 | Disposition: A | Discharge status: Home / Self Care | Payer: Medicare Other | Attending: Emergency Medicine | Admitting: Emergency Medicine

## 2011-01-29 DIAGNOSIS — R51 Headache: Secondary | ICD-10-CM | POA: Insufficient documentation

## 2011-01-29 DIAGNOSIS — S022XXA Fracture of nasal bones, initial encounter for closed fracture: Secondary | ICD-10-CM | POA: Insufficient documentation

## 2011-01-29 DIAGNOSIS — F17200 Nicotine dependence, unspecified, uncomplicated: Secondary | ICD-10-CM | POA: Insufficient documentation

## 2011-01-29 DIAGNOSIS — Y998 Other external cause status: Secondary | ICD-10-CM | POA: Insufficient documentation

## 2011-01-29 DIAGNOSIS — F411 Generalized anxiety disorder: Secondary | ICD-10-CM | POA: Insufficient documentation

## 2011-01-29 DIAGNOSIS — Z794 Long term (current) use of insulin: Secondary | ICD-10-CM | POA: Insufficient documentation

## 2011-01-29 DIAGNOSIS — G40802 Other epilepsy, not intractable, without status epilepticus: Secondary | ICD-10-CM | POA: Insufficient documentation

## 2011-01-29 DIAGNOSIS — E039 Hypothyroidism, unspecified: Secondary | ICD-10-CM | POA: Insufficient documentation

## 2011-01-29 DIAGNOSIS — E119 Type 2 diabetes mellitus without complications: Secondary | ICD-10-CM | POA: Insufficient documentation

## 2011-01-29 DIAGNOSIS — Z79899 Other long term (current) drug therapy: Secondary | ICD-10-CM | POA: Insufficient documentation
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
## 2011-02-07 NOTE — Cardiovascular Report (Signed)
[place]. [facility]  Patient:    [name], [name]                     MRN: [id number] Proc. Date: 12/07/00 Adm. Date:  [id number] Attending:  Rollene Rotunda CC:         Kari Baars, M.D.  Rollene Rotunda, M.D. [facility]  Cardiac Catheterization Lab   Cardiac Catheterization  PROCEDURE:  Left heart catheterization with coronary angiography and left ventriculography.  CARDIOLOGIST:  Daisey Must, M.D. [facility]  INDICATION:  Ms. Claudio is a 50 year old woman with multiple cardiovascular risk factors.  She was admitted with chest pain and referred for cardiac catheterization.  CATHETERIZATION PROCEDURAL NOTE:  A 6-French sheath was placed in the right femoral artery.  Standard Judkins 6-French catheter were utilized.  Contrast is Omnipaque.  There were no complications.  CATHETERIZATION RESULTS:  HEMODYNAMICS:  Left ventricular pressure 140/15, aortic pressure 140/76. There as no aortic valve gradient.  LEFT VENTRICULOGRAM:  Wall motion is normal.  Ejection fraction calculated at 60%.  There was trace mitral regurgitation.  CORONARY ANGIOGRAPHY: (Left dominant).  Left main is normal.  Left anterior descending artery has a 20% stenosis in the mid vessel just after the origin of the first diagonal branch.  The LAD gives rise to a normal size first diagonal.  Left circumflex is a dominant vessel.  It gives rise to a small first marginal, large second marginal, normal size first posterolateral branch, small second and third posterolateral branch, and a normal size posterior descending artery.  The distal circumflex has a 20% stenosis before the origin of the posterior descending artery.  Right coronary artery is a small nondominant vessel which is free of angiographic disease.  IMPRESSION: 1. Normal left ventricular systolic function. 2. Insignificant coronary artery disease.  The patients chest pain appears to    be noncardiac in etiology. DD:   12/07/00 TD:  12/08/00 Job: [id number]

## 2011-02-07 NOTE — Discharge Summary (Signed)
[place]. [facility]  Patient:    [name], [name]                     MRN: [id number] Adm. Date:  [id number] Disc. Date: [id number] Attending:  Rollene Rotunda Dictator:   Brita Romp, P.A. CC:         Kari Baars, M.D., [address], [place], [place] [address]   Discharge Summary  DISCHARGE DIAGNOSES: 1. Chest pain, status post negative cardiac catheterization. 2. Anemia. 3. Insulin-dependent diabetes mellitus. 4. Anxiety. 5. Seizure disorder. 6. Status post cerebrovascular accident/transient ischemic attack.  ADMISSION HISTORY:  The patient presented to the [facility] ER in transfer from [facility] ER complaining of chest pain radiating to the left arm with cough and nausea.  Prior to admission, the patient had seen her primary physician who prescribed an antibiotic along with a cough syrup. Per the patient, she had initially felt better, but the day prior to admission she began to feel worse.  She described her chest pain as squeezing with some into the left arm, numbness around the mouth, and a dry cough.  She also stated that the pain was constantly present, but waxed and waned from approximately a 2 to 9/10.  She was seen and admitted by Dr. Angelina Sheriff. Dr. Antoine Poche felt that while some of her pain pattern was atypical and the pain was reproducible with palpation over the chest, she had multiple risk factors for coronary disease, and therefore, it was prudent to take the patient to the cath lab.  HOSPITAL COURSE:  The following day, the patient reported no chest pain. Serial cardiac enzymes were negative for MI.  The following day, the patient was taken to the cath lab by Dr. Daisey Must.  There were no left ventricular wall motion abnormalities noted, and ejection fraction was calculated at 60%.  In the left anterior descending artery there was a small 20% lesion in the mid area.  In the circumflex there was distally a  small 30% lesion as well.  Dr. Gerri Spore felt that the degree of coronary artery disease was insignificant.  The following day, the patient was again seen by Dr. Antoine Poche.  The patient reports she still had some back and chest pain as well as ankle swelling.  He noted that the patients hemoglobin was 10.8, hematocrit 31.3.  This was actually somewhat of an improvement over admission labs, and without any signs of an acute GI bleed, he felt that the patient was stable for discharge.  DISCHARGE MEDICATIONS: 1. The patient is to resume her normal insulin dosing. 2. The patient is to take Xanax as previously directed. 3. The patient is to take her Depakote as previously directed. 4. The patient is to take an enteric-coated aspirin 325 mg q.d. 5. The patient is to take Tequin 400 mg q.d. x 7 days.  DISCHARGE INSTRUCTIONS:  The patient was advised to avoid driving, heavy lifting, tub baths, and sexual activity until March 21.  She is to be on a low-fat, low-cholesterol diet.  She was instructed to watch the cath site for pain, bleeding, and swelling, and to call the [place] office with any of these problems.  DISCHARGE FOLLOWUP:  The patient is to follow up with Dr. Juanetta Gosling.  She is to call for an appointment within the next two weeks.  The patient is to follow up with the [facility] in [place] for a groin check.  The office is to call.  LABORATORY AND X-RAY DATA:  Serial cardiac enzymes were negative for MI. Hemoglobin A1C was noted to be elevated at 9.1.  TSH 0.368.  Total cholesterol 131, triglycerides 112, HDL 47, LDL 62, total cholesterol/HDL ratio 2.8. Sodium 143, potassium 3.8, chloride 107, CO2 30, BUN 10, creatinine 0.8, glucose 166.  White count 6.1, hemoglobin 10.8, hematocrit 31.3, platelets 220.  No chest x-ray was obtained during this hospital stay due to a recent film on March 4.  This film showed no acute disease.  Electrocardiogram revealed sinus rhythm at  approximately 69, P-R interval 0.130, QRS of 0.070, Q-Tc of 0.417, axis 62.  There were no ischemic changes and no ectopy noted. DD:  12/08/00 TD:  12/09/00 Job: [id number]
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
## 2011-02-07 NOTE — H&P (Signed)
   NAME:  [name], Mandy                        ACCOUNT NO.:  [id number]   MEDICAL RECORD NO.:  [id number]                   PATIENT TYPE:  OBV   LOCATION:  A418                                 FACILITY:  [facility]   PHYSICIAN:  Roylene Reason. Lisette Grinder, M.D.             DATE OF BIRTH:  Dec 08, 1960   DATE OF ADMISSION:  04/25/2002  DATE OF DISCHARGE:  04/26/2002                                HISTORY & PHYSICAL   HISTORY OF PRESENT ILLNESS:  This is a 50 year old, insulin-dependent  diabetic who complains of abdominal swelling, vomiting and diarrhea.  The  patient is known to have prior surgical history of laparoscopy with lysis of  adhesions and right ovarian cystectomy performed on April 22, 2002.   REVIEW OF SYMPTOMS:  Pertinent for patient currently passing flatus.  She  has had frequent diarrhea and a low-grade temperature 98-99 degrees.  The  patient presented to the emergency room on April 25, 2002, with these  complaints after which time I was notified and assumed complete management  of the patient.   PAST MEDICAL HISTORY:  She is an insulin-dependent diabetic with current  dosages in the a.m. with 18 units N and 10 units R in the a.m.  In the p.m.,  13 units N and 5 units R.  The patient had called the office today  requesting a Tylox refill and she was unable to get this filled secondary to  transportation difficulties, thus she presented to the emergency room.   PHYSICAL EXAMINATION:  GENERAL:  She is in mild distress.  VITAL SIGNS:  Blood pressure 116/59, pulse 85, temperature 97.3,  respirations 16.                                               Donald P. Lisette Grinder, M.D.    DPC/MEDQ  D:  04/29/2002  T:  05/02/2002  Job:  [id number]

## 2011-02-07 NOTE — Op Note (Signed)
NAME:  [name], [name]                        ACCOUNT NO.:  [id number]   MEDICAL RECORD NO.:  [id number]                   PATIENT TYPE:  AMB   LOCATION:  DAY                                  FACILITY:  [facility]   PHYSICIAN:  Roylene Reason. Lisette Grinder, M.D.             DATE OF BIRTH:  01-01-1961   DATE OF PROCEDURE:  04/22/2002  DATE OF DISCHARGE:  04/22/2002                                 OPERATIVE REPORT   PREOPERATIVE DIAGNOSES:  1. Chronic pelvic pain.  2. Hemorrhagic ovarian cyst, right ovary; simple cyst, left ovary.   SURGEON:  Roylene Reason. Lisette Grinder, M.D.   SURGICAL PROCEDURE:  1. Laparoscopy.  2. Lysis of adhesions within the left adnexa.  3. Rupture of right ovarian cyst.   ESTIMATED BLOOD LOSS:  Minimal.   COMPLICATIONS:  None.   SPECIMENS:  None.   DRAINS:  Foley catheter to straight drain following the procedure.   DESCRIPTION:  The procedure in only complicated by the patient's history of  insulin-dependent diabetes leading also to her morbid obesity.  The patient  did have a large amount of the morbid obesity in her anterior abdominal wall  with a deep fatty layer of subcutaneous tissue as well as a large amount of  preperitoneal fat.  This led to high __________  pressures and limited the  amount of  Trendelenburg we could use during the operative procedure.  Likewise, it made introduction of our instruments very difficult.  Thus, the  planned BSO was not performed as the risks of continuing performing the  procedure would outweigh the benefits.   SUMMARY:  The patient was taken to the operating room.  Vital signs were  stable.  The patient underwent uncomplicated induction of general  endotracheal anesthesia.  She was then placed in the low lithotomy position,  prepped and draped in the usual sterile manner.  A Foley catheter sterilely  placed to straight drain  with findings of about 400 cc of clear yellow  urine.  A reinforced subcutaneous sponge then placed in the  vaginal vault  for manipulation.  I then changed sterile gloves standing at the patient's  bedside and a 1-cm vertical incision was made just inferior to the umbilicus  as well as the midline suprapubic incision.  The abdominal wall is then  elevated.  The Veress needle is then passed through the abdominal wall  perpendicular to the fascial plane in an effort to enter the peritoneal  space.  On the initial attempt, the drop test appeared to confirm an  intraperitoneal placement.  Pneumoperitoneum is then created by insufflating  2 L of CO2 gas with a filling pressure of less than 16 mmHg.  The Veress  needle is then removed.  The abdominal wall was again elevated.  The 10-mm  disposable trocar and sleeve with the safety sheath was then inserted  through the subumbilical incision angulating toward the hollow of sacrum.  After inspection of the instrument, the trocar is removed.  The diagnostic  scope is inserted which unfortunately reveals that the prior insufflation of  gas had been preperitoneal.  Thus, the instruments are removed and the gas  was allowed to escape from the preperitoneal space.  I then used my index  fingers to dissect down to the deep subcutaneous fat at the level of the  subumbilical incision.  In this manner, I was able to atraumatically enter  the peritoneal cavity which then allowed direct introduction of our sleeve.  Initially, the short sleeve was utilized.  The scope was inserted, which  confirmed the proper intraperitoneal placement.  Vaseline-soaked gauze was  required at the level of the incision to make an airtight seal.  Pneumoperitoneum is then created, connecting the gas directly up to the 10-  mm sleeve.  After the pneumoperitoneum is created, the pelvic contents are  visualized.  Difficulties again obtained which required use of the longer 10-  mm sleeve which is again introduced directly into the peritoneal cavity.  This is done atraumatically.   Vaseline-soaked gauze is again replaced to  form an airtight seal.  Pneumoperitoneum is then created by insufflating 3 L  of CO2 gas.  This allowed visualization of the pelvic cavity, as described  previously.  Again, large amount of preperitoneal fat is identified both in  the anterior abdominal wall and laterally.  Under direct visualization, a 5-  mm trocar and sleeve was inserted just suprapubically.  Again, this is  markedly complicated by the very dense, thick preperitoneal fat.  The entire  length of the 5-mm sleeve is required to reach from the level of skin down  to the intraperitoneal space.  A third puncture site is then performed  lateral to the umbilicus utilizing a 5-mm trocar sleeve.  Again, this is  done under direct visualization.  With these sites inserted and the  pneumoperitoneum is maintained, we were able to manipulate the pelvic  contents.  The right ovary was noted to be normal in appearance with a  single hemorrhagic-appearing cyst which ruptured upon manipulation.  There  was adhesive disease encountered within the right ovary.  The left ovary is  then identified.  There was some fine, filmy adhesive disease between the  left ovary and the proximal portion of the fallopian tube.  This was easily  lysed utilizing blunt dissection.  The left ovary is visualized and noted to  have a simple follicular-appearing cyst which is noted to rupture upon  manipulation.  The ovaries appear normal at this point in time.  No other  adhesive disease is encountered.  Great difficulty is obtained at this time  in maintaining our pneumoperitoneum secondary to multiple interruptions of  our trocars.  In addition, the two 5-mm trocars are very limited in their  mobility secondary to the deep subcutaneous fat and limited mobility of  these trocars secondary to the deep level.  Thus, significant difficulty was  also being encountered with continued performance of the operation.   In addition, larger trocars would have to be inserted for use of the Endo GIA.  Thus, the procedure is to be terminated at this point in time.  The  operating room staff is notified of this intention.  Pneumoperitoneum is  allowed to escape as our instruments are removed and the puncture sites  __________ utilizing 0 __________  fascial layer.  Nylon suture is then  placed in figure-of-eight fashion through the skin edges to  reapproximate  the skin edges.  A total of 10 cc of 0.5% bupivacaine plain is then injected  along the subcutaneous space along the incisions __________  postoperative  analgesia.  The patient tolerated the procedure very well.  The Foley  catheter was left in place.  Sponge removed from the vagina.  The patient is  reversed of anesthesia, taken to the recovery room in stable condition.  Operative findings discussed with the patient's awaiting family including  discussion of the inability to remove ovaries and tubes.  The Climara patch  is placed in the recovery room as the patient is experiencing perimenopausal  hot flashes at this time.                                               Donald P. Lisette Grinder, M.D.    DPC/MEDQ  D:  04/22/2002  T:  04/29/2002  Job:  [id number]

## 2011-02-07 NOTE — Group Therapy Note (Signed)
[name], BLUCHER              ACCOUNT NO.:  [id number]   MEDICAL RECORD NO.:  [id number]          PATIENT TYPE:  INP   LOCATION:  A301                          FACILITY:  [facility]   PHYSICIAN:  Edward L. Juanetta Gosling, M.D.DATE OF BIRTH:  04-10-1961   DATE OF PROCEDURE:  DATE OF DISCHARGE:                                   PROGRESS NOTE   PROBLEM LIST:  1. Uncontrolled diabetes.  2. Seizure disorder.  3. Positive blood culture.  4. Nausea and vomiting probably related to #1.   HISTORY:  [name] says that she is feeling better.  She still got some  nausea.  She had a little bit last night but had some vomiting after that.  She has some heartburn this morning.   PHYSICAL EXAMINATION:  VITAL SIGNS:  Shows that her temperature is 97.9,  pulse 72, respirations 20, blood sugar 113, blood pressure 116/72.  CHEST:  Clear.  HEART:  Regular.  ABDOMEN:  Soft.  Bowel sounds present and active.   Her white count is 5,800, hemoglobin is 12.4, platelets 241.  Glucose 124,  this morning.  She has a positive blood culture with gram positive rods and  with lack of fever and lack of symptoms of anything else, I think that is  probably a contaminant but I will go ahead and put on an antibiotic.  TSH is  normal.  Her urine culture shows no growth.   ASSESSMENT:  She is improving.   PLAN:  Try to get her on to more of her normal protocol and if we can get  her sugar under control I am going to add some Protonix for her heartburn,  add Ceftin short-term for her positive blood culture, which again I think is  a contaminant.  I expect she will be ready to go in the next 24 to 48 hours.      Edward L. Juanetta Gosling, M.D.  Electronically Signed     ELH/MEDQ  D:  07/03/2006  T:  07/04/2006  Job:  [id number]

## 2011-02-07 NOTE — Group Therapy Note (Signed)
[name], [name]              ACCOUNT NO.:  [id number]   MEDICAL RECORD NO.:  [id number]          PATIENT TYPE:  INP   LOCATION:  A301                          FACILITY:  [facility]   PHYSICIAN:  Edward L. Juanetta Gosling, M.D.DATE OF BIRTH:  11-29-1960   DATE OF PROCEDURE:  07/02/2006  DATE OF DISCHARGE:                                   PROGRESS NOTE   SUBJECTIVE:  [name] was able to come off the insulin drip briefly then  had to go back on.  She says she is still nauseated but better in general.   OBJECTIVE:  Her physical exam shows that her temperature is 97.6, pulse was  80, respirations are 18, blood sugar 85, blood sugar 189 (they have been  back in the 200s), blood pressure 122/69.  Her chest is clearer.  Her  potassium 5.1, sodium 139, chloride 109, blood sugar 336, BUN 12, creatinine  0.7.   ASSESSMENT:  She is better.  She has still got significant problems with her  blood sugar not being controlled.   PLAN:  My plan is to restart her insulin drip this morning.  If she does  better, we will hope to get her back off later today.      Edward L. Juanetta Gosling, M.D.  Electronically Signed     ELH/MEDQ  D:  07/02/2006  T:  07/04/2006  Job:  [id number]

## 2011-02-07 NOTE — Discharge Summary (Signed)
[name], [name]              ACCOUNT NO.:  [id number]   MEDICAL RECORD NO.:  [id number]          PATIENT TYPE:  INP   LOCATION:  A301                          FACILITY:  [facility]   PHYSICIAN:  Kingsley Callander. Ouida Sills, MD       DATE OF BIRTH:  11/05/1960   DATE OF ADMISSION:  07/01/2006  DATE OF DISCHARGE:  10/14/2007LH                                 DISCHARGE SUMMARY   DISCHARGE DIAGNOSES:  1. Uncontrolled diabetes.  2. Hypothyroidism.  3. Seizure disorder.  4. Panic attacks.  5. Anxiety.  6. Chronic low back pain.   HOSPITAL COURSE:  This patient is a 50 year old female who presented with  uncontrolled diabetes, nausea, and vomiting.  She had been using an insulin  pump but there had been some confusion regarding its management.  Her  initial glucoses were 463 and 530.  She had a white count of 8000 with a  hemoglobin of 13.4.  Blood cultures were obtained and one was positive for a  gram-positive rod which was felt to be a contaminant.  Urine culture was  negative.  Another blood culture was negative.  She has not experienced  fever.  Her chest x-ray revealed no infiltrate.  She was hospitalized and  treated with insulin and antiemetics.  Glucoses improved.  She was started  on Lantus 20 units q.12 hours instead of her insulin pump.  NovoLog coverage  was provided (she had originally been treated with a glucommander).  Her  diet was advanced.  She improved and was felt to be stable for discharge on  October 14.  Her glucoses on the day prior to discharge ranged from 97-186.   Her repeat white count was 5.8 with 39 neutrophils and 48% lymphocytes.   She has hypothyroidism and takes Synthroid.  Her TSH was 1.0.   She has a seizure disorder history.  Her valproic acid level was less than  10.   Her urine drug screen was positive for benzodiazepines, cannabinoids, and  opiates.   DISCHARGE MEDICATIONS:  1. Lantus 40 units daily.  2. NovoLog 2 units before meals if the glucose is  under 200, then for a      glucose of 201-250 add 2 units, 251-300 units add 4 units, 301-350 add      6 units, 351-400 add 8 units, over 400 add 10 units.  3. Synthroid 200 mcg daily.  4. Nexium 40 mg daily.  5. Depakote 500 mg q.i.d.  6. Alprazolam, Lortab, and Ambien p.r.n.   FOLLOWUP:  The patient was advised see Dr. Juanetta Gosling in 1 week.      Kingsley Callander. Ouida Sills, MD  Electronically Signed     ROF/MEDQ  D:  07/05/2006  T:  07/05/2006  Job:  [id number]

## 2011-02-07 NOTE — H&P (Signed)
NAMEALLEXA, [name]              ACCOUNT NO.:  [id number]   MEDICAL RECORD NO.:  [id number]          PATIENT TYPE:  INP   LOCATION:  A214                          FACILITY:  [facility]   PHYSICIAN:  Edward L. Juanetta Gosling, M.D.DATE OF BIRTH:  1960-12-31   DATE OF ADMISSION:  07/01/2006  DATE OF DISCHARGE:  LH                                HISTORY & PHYSICAL   REASON FOR ADMISSION:  Uncontrolled blood sugar, nausea and vomiting.   HISTORY:  [name] is a 50 year old with a long known history of diabetes.  She says that she has had high blood sugar and that I am managing her  diabetes, but I have not seen her in my office for several months.  She is  using an insulin pump.  She said that she was okay, then got problems with  her blood sugar and she has had some chest pain, she has had some fatigue.  She has had some chest tightness and urinary symptoms.  She has a  significant history of diabetes,, hypertension, hypothyroidism and seizure  disorder.  She does not have any other symptoms.  She said she has not had  any abdominal pain, she has not had any diarrhea.   PAST MEDICAL HISTORY:  1. Diabetes.  2. Severe anxiety.  3. Chronic low back pain.  4. Hypothyroidism.  5. Panic attacks.  6. Seizure disorder.   FAMILY HISTORY:  Positive for strokes, diabetes, hypertension, lung cancer.   SOCIAL HISTORY:  She smokes about a half-pack of cigarettes daily.  She is  vague about alcohol use or other drug use.   She is allergic to PHENYTOIN.   Medications at home are apparently Xanax, Cipro, Phenergan, Synthroid,  Nexium, Zyrtec, Ambien, Depakote, insulin pump, but she does not know the  doses of any of them.   PHYSICAL EXAMINATION:  GENERAL:  A well-developed female who looks  considerably older than her stated age.  VITAL SIGNS:  Her blood pressure is about 100/70, pulse is in the 70s.  HEENT:  Her pupils are reactive to light and accommodation.  Her mucous  membranes are dry.  NECK:   Supple without masses.  CHEST:  Rhonchi bilaterally.  CARDIAC:  Her heart is regular without murmur, gallop, or rub.  ABDOMEN:  Soft, no mass is felt.  EXTREMITIES:  No edema.  CENTRAL NERVOUS SYSTEM:  Grossly intact.  NEUROLOGIC:  She is okay.  No acute changes.   LABORATORY DATA:  CBC shows white count is 8000, hemoglobin 13.4, platelets  260.  BMET:  Sodium is 131, glucose 463, BUN is 18, creatinine 1.1.  Urinalysis is essentially negative except for glucose.  Microscopic shows a  few squamous cells.  Cardiac markers normal.  Depakote level less than 10.  Blood culture x2 pending, of course.  Drug screen positive for opiates and  benzodiazepines, which she is on, and marijuana, which of course she is not,  medically.  No cocaine.  Chest x-ray   Dictation ended at this point.      Edward L. Juanetta Gosling, M.D.  Electronically Signed     ELH/MEDQ  D:  07/01/2006  T:  07/01/2006  Job:  [id number]

## 2011-02-07 NOTE — Discharge Summary (Signed)
NAME:  [name], [name]                        ACCOUNT NO.:  [id number]   MEDICAL RECORD NO.:  [id number]                   PATIENT TYPE:  OBV   LOCATION:  A418                                 FACILITY:  [facility]   PHYSICIAN:  Roylene Reason. Lisette Grinder, M.D.             DATE OF BIRTH:  04/13/1961   DATE OF ADMISSION:  04/25/2002  DATE OF DISCHARGE:  04/26/2002                                 DISCHARGE SUMMARY   OBSERVATION NOTE:   DIAGNOSES:  Status post laparoscopy 04/22/02 with postoperative ileus with  symptoms of diarrhea, nausea and vomiting.   DISPOSITION:  The patient is to follow up in the office in 3 days time for  suture removal from incision sites.   DISCHARGE MEDICATIONS:  To continue with her Depakote, Xanax and insulin as  per preoperatively.  In addition she is renewed prescription for Tylox 1-2  q.4h. p.r.n. postoperative pain #30 with no refill.   DISCHARGE INSTRUCTIONS:  The patient is given copies of the standardized  discharge instructions at the time of discharge.  She is advised that each  day she should feel a little bit better.  She should not run fevers and if  abdominal pain increases she may require reevaluation.   LABORATORY DATA:  Pertinent laboratory studies: White count is 5.8,  hemoglobin 10.6, hematocrit 30.2, liver function tests within normal limits.  Urine also within normal limits with negative nitrites, negative esterase,  negative white cells, negative bacteria.  In addition negative proteinuria.   HISTORY OF PRESENT ILLNESS:  This is a 50 year old insulin-dependent  diabetic who presented to the emergency room 04/25/02 in the p.m.  She  actually came to the emergency room by ambulance complaining of abdominal  swelling, vomiting, and diarrhea.  She states that this has progressively  been increasing over the prior 2 days. She had contacted our office on  04/26/51 at which time she requested a narcotic refill, and the Tylox  prescription was  rewritten for the patient, for her to pick up.  However,  apparently she was unable to obtain transportation.  Thus, throughout the  day she had had no Tylox since the morning.  She had increasing abdominal  pain and thus presented to the emergency room.  I was contacted when she  presented to the emergency room to assume complete care and management of  this patient.   PAST SURGICAL HISTORY:  Past medical history pertinently laparoscopy with  lysis of adhesions and right ovarian cystectomy on 04/22/02.  Operative  procedure was without complications.  The patient was discharged home on  04/22/02.   ALLERGIES:  The patient states that she is allergic to Dilantin.   PAST MEDICAL HISTORY:  Medical history is pertinent for seizure disorder for  which she takes Depakote 500 mg p.o. q.i.d., Xanax 1 mg p.o. q.i.d.  She is  an insulin-dependent diabetic on a 2000 calorie ADA diet, Humulin in the  a.m. 18N  52 of R; p.m. 52N and 52 of R.   PHYSICAL EXAMINATION:   GENERAL:  The patient states that she is in mild distress.   VITAL SIGNS:  Blood pressure 116/69, pulse of 85, temperature 97.3,  respiratory rate is 16.   HEENT:  Mucous membranes slightly dry.   ABDOMEN:  Abdominal examination reveals morbid trunkal obesity.  There is  bruising at the laparoscopic site.  The incisions are bandaged, but upon  removal of the bandage the incision sites are very clean and dry. No  erythema, no induration, no rebound or guarding upon abdominal exam.  Bowel  sounds are appreciated in all quadrants.   Laboratory studies as stated previously.  In addition a flat place of the  abdomen had been obtained which revealed nonspecific gas pattern consistent  with the postoperative state.  The patient also complained of diarrhea x2  days duration.   ADMISSION DIAGNOSIS:  Postoperative ileus with resultant diarrhea as well as  nausea with some vomiting.  Due to that coexistent with her insulin-  dependent  diabetes and possibility of an iatrogenic bowel injury the patient  was referred for observation on 04/25/02.   HOSPITAL COURSE:  The patient did well throughout the evening 04/25/02.  She  had an admission blood sugar of 122.  She was kept on her usual Humulin  dosages.  On the day of discharge her blood sugar was 63 just prior to  eating. It will be checked again after her afternoon meal.  She, however,  most pertinently remains afebrile during her hospital course.  Her abdominal  pain has improved.  On examination the abdomen is soft, nondistended,  nontender.  No rebound or guarding.  She is passing flatus. She has had good  response to p.o. Lomotil for the diarrhea and most pertinently has consumed  the entirety of her noon meal, thus essentially, ruling out a bowel injury.  The patient is thus discharged home on 04/26/02.                                               Donald P. Lisette Grinder, M.D.    DPC/MEDQ  D:  04/26/2002  T:  04/30/2002  Job:  [id number]

## 2011-02-07 NOTE — Op Note (Signed)
NAME:  [name], [name]                        ACCOUNT NO.:  [id number]   MEDICAL RECORD NO.:  [id number]                   PATIENT TYPE:  AMB   LOCATION:  DAY                                  FACILITY:  [facility]   PHYSICIAN:  Lazaro Arms, M.D.                DATE OF BIRTH:  12-12-1960   DATE OF PROCEDURE:  DATE OF DISCHARGE:                                 OPERATIVE REPORT   PREOPERATIVE DIAGNOSES:  1. Chronic pelvic pain.  2. Right lower quadrant pain.   POSTOPERATIVE DIAGNOSES:  1. Chronic pelvic pain.  2. Right lower quadrant pain.  3. Right ovarian cyst.   PROCEDURE:  Laparoscopic bilateral salpingo-oophorectomy.   SURGEON:  Lazaro Arms, M.D.   ANESTHESIA:  General endotracheal   FINDINGS:  The patient had an enlarged right ovary.  It had a cyst on the  inferior aspect.  It looked like a corpus luteum cyst or maybe just a simple  follicular cyst.  The left fallopian tube and ovary appeared to be normal.  There were no other abnormalities of the peritoneal cavity.  There were no  adhesions.   DESCRIPTION OF PROCEDURE:  The patient was taken to the operating room and  placed in the supine position and underwent general endotracheal anesthesia.  She was then placed in dorsal lithotomy position and prepped and draped in  the usual sterile fashion for laparoscopic procedure.  A Foley catheter was  placed and a sterile sponge stick placed in the vagina for vaginal  manipulation.   An open laparoscopy was performed.  Incision was made under the umbilicus.  Subcu tissue was dissected. The fascia was dissected and incised and the  peritoneal cavity was entered bluntly.  There were no adhesions around the  umbilicus.  A trocar was then placed under direct visualization and the  peritoneal cavity was insufflated.  The trocar was placed without any  obturator in it at all.  The peritoneal cavity was insufflated without  difficulty.  Two fingerbreadths above the pubis an  incision was made and a 5-  mm nonbladed obturator was placed in the peritoneal cavity without  difficulty.   A 22-gauge spinal needle was then placed in the left lower quadrant, lateral  to the inferior epigastric artery and vein and the peritoneal cavity was  entered without difficult.  A 5-mm nonbladed obturator trocar was then  placed in this area, again, with no bleeding. The harmonic scalpel was used  and the right ovary was grasped and pulled away from the side wall.  The  infundibulopelvic ligament, the round ligament and the fallopian tube were  cauterized and cut with the harmonic scalpel.  There was good hemostasis.  The ureter was identified throughout its course.   The left fallopian tube and ovary were then grasped and pulled from the side  wall.  The harmonic scalpel was then used to take down this  infundibulopelvic, round ligament, and fallopian tube as well with good  hemostasis.  Both pedicles were hemostatic and both ureters were well out of  the way of dissection.  A 5 mm laparoscope was used in the left lower  quadrant and the Endopouch was placed and both ovaries were removed with a  single polyp.  They were sent separately.  The 2-mm laparoscope was then  placed in the umbilical incision.  Both pedicles were found to be  hemostatic.  Irrigation was performed.  Again, with hemostasis and no injury  was identified.  There was no injury directly below the umbilical incision  either.   Both trocars were removed under direct visualization.  The gas was allowed  to escape.  The fascia was closed with a figure-of-eight suture and there  was good repair of the fascia. The skin was closed using skin staples in all  areas.  The patient tolerated the procedure well.  She experienced minimal  blood loss; taken to recovery room in good stable condition.  All counts  were correct.  She received Levaquin prophylactically.                                               Lazaro Arms, M.D.    Loraine Maple  D:  06/30/2002  T:  06/30/2002  Job:  [id number]

## 2011-02-07 NOTE — H&P (Signed)
NAMESHARNA, Mandy              ACCOUNT NO.:  [id number]   MEDICAL RECORD NO.:  [id number]          PATIENT TYPE:  INP   LOCATION:  A214                          FACILITY:  [facility]   PHYSICIAN:  Edward L. Juanetta Gosling, M.D.DATE OF BIRTH:  [birth year]   DATE OF ADMISSION:  07/01/2006  DATE OF DISCHARGE:  LH                                HISTORY & PHYSICAL   ADDENDUM:    Her chest x-ray is pending.   ASSESSMENT:  She has diabetes, uncontrolled despite the use of insulin pump.  She has nausea and vomiting that I suspect is related to her uncontrolled  diabetes, and I am going to plan to continue with her medication, but I am  going to plan to put her on an insulin drip, give her IV fluids, potassium  replacement, etc., per protocol.      Edward L. Juanetta Gosling, M.D.  Electronically Signed     ELH/MEDQ  D:  07/02/2006  T:  07/03/2006  Job:  [id number]

## 2011-02-07 NOTE — Discharge Summary (Signed)
   NAME:  [name], [name]                        ACCOUNT NO.:  [id number]   MEDICAL RECORD NO.:  [id number]                   PATIENT TYPE:  AMB   LOCATION:  DAY                                  FACILITY:  [facility]   PHYSICIAN:  Roylene Reason. Lisette Grinder, M.D.             DATE OF BIRTH:  07/30/1961   DATE OF ADMISSION:  04/22/2002  DATE OF DISCHARGE:  04/22/2002                                 DISCHARGE SUMMARY   DISPOSITION:  The patient was discharged to home on date of service.  Foley  catheter removed prior to discharge.   DISCHARGE MEDICATIONS:  Tylox, #30, for pain relief.   The patient is given copy of standardized discharge instructions.  Follow up  in the office in one weeks' time.  Climara patch is left in place.   PERTINENT LABORATORY STUDIES:  The patient's Accu-Chek upon arrival is 331.  This is in the fasting state.                                                Donald P. Lisette Grinder, M.D.    DPC/MEDQ  D:  04/22/2002  T:  04/29/2002  Job:  [id number]

## 2011-02-07 NOTE — Discharge Summary (Signed)
   NAME:  [name], [name]                        ACCOUNT NO.:  [id number]   MEDICAL RECORD NO.:  [id number]                   PATIENT TYPE:  AMB   LOCATION:  DAY                                  FACILITY:  [facility]   PHYSICIAN:  Roylene Reason. Lisette Grinder, M.D.             DATE OF BIRTH:  October 05, 1960   DATE OF ADMISSION:  DATE OF DISCHARGE:                                 DISCHARGE SUMMARY   The patient presented today for permanence of a laparoscopic bilateral  salpingo-oophorectomy.  I was informed in the early a.m. that when the  patient arrived she had a fasting blood sugar obtained that was 451.  Yesterday's blood sugar was 313.  Due to this elevated blood sugar and  poorly controlled diabetes, the surgical procedure was cancelled on today's  date.  Dr. Juanetta Gosling, her primary care physician, was notified of her elevated  blood sugar.  The surgical procedure will be rescheduled at a later date  when more appropriate control of her diabetes occurs.                                               Donald P. Lisette Grinder, M.D.    DPC/MEDQ  D:  04/19/2002  T:  04/23/2002  Job:  [id number]   cc:   Fredirick Maudlin, M.D.

## 2011-04-10 ENCOUNTER — Encounter: Payer: Self-pay | Admitting: Emergency Medicine

## 2011-04-10 ENCOUNTER — Emergency Department (HOSPITAL_COMMUNITY)
Admission: EM | Admit: 2011-04-10 | Discharge: 2011-04-10 | Discharge status: Against Medical Advice | Payer: Medicare Other | Attending: Emergency Medicine | Admitting: Emergency Medicine

## 2011-04-10 DIAGNOSIS — R109 Unspecified abdominal pain: Secondary | ICD-10-CM | POA: Insufficient documentation

## 2011-04-10 DIAGNOSIS — R112 Nausea with vomiting, unspecified: Secondary | ICD-10-CM | POA: Insufficient documentation

## 2011-04-10 DIAGNOSIS — M549 Dorsalgia, unspecified: Secondary | ICD-10-CM | POA: Insufficient documentation

## 2011-04-10 HISTORY — DX: Hypothyroidism, unspecified: E03.9

## 2011-04-10 HISTORY — DX: Anxiety disorder, unspecified: F41.9

## 2011-04-10 NOTE — ED Notes (Signed)
Pt c/o lower abd pain with n/v since Monday. Pt seen pcp for the same and dx with uti.

## 2011-04-14 ENCOUNTER — Other Ambulatory Visit: Payer: Self-pay | Admitting: Physician Assistant

## 2011-04-14 DIAGNOSIS — R109 Unspecified abdominal pain: Secondary | ICD-10-CM

## 2011-04-15 ENCOUNTER — Inpatient Hospital Stay (HOSPITAL_COMMUNITY)
Admission: RE | Admit: 2011-04-15 | Discharge: 2011-04-15 | Payer: Medicare Other | Source: Ambulatory Visit | Attending: Physician Assistant | Admitting: Physician Assistant

## 2011-04-16 ENCOUNTER — Encounter: Payer: Self-pay | Admitting: Family Medicine

## 2011-04-16 ENCOUNTER — Encounter (HOSPITAL_COMMUNITY): Payer: Self-pay

## 2011-04-16 ENCOUNTER — Ambulatory Visit (HOSPITAL_COMMUNITY)
Admission: RE | Admit: 2011-04-16 | Discharge: 2011-04-16 | Disposition: A | Discharge status: Home / Self Care | Payer: Medicare Other | Source: Ambulatory Visit | Attending: Physician Assistant | Admitting: Physician Assistant

## 2011-04-16 DIAGNOSIS — R10A1 Flank pain, right side: Secondary | ICD-10-CM

## 2011-04-16 DIAGNOSIS — R109 Unspecified abdominal pain: Secondary | ICD-10-CM

## 2011-04-16 DIAGNOSIS — R1031 Right lower quadrant pain: Secondary | ICD-10-CM | POA: Insufficient documentation

## 2011-04-16 MED ORDER — IOHEXOL 300 MG/ML  SOLN
100.0000 mL | Freq: Once | INTRAMUSCULAR | Status: AC | PRN
  Administered 2011-04-16: 100 mL via INTRAVENOUS

## 2011-06-03 ENCOUNTER — Ambulatory Visit (HOSPITAL_BASED_OUTPATIENT_CLINIC_OR_DEPARTMENT_OTHER)
Admission: RE | Admit: 2011-06-03 | Discharge: 2011-06-03 | Disposition: A | Discharge status: Home / Self Care | Payer: Medicare Other | Source: Ambulatory Visit | Attending: Urology | Admitting: Urology

## 2011-06-03 DIAGNOSIS — J438 Other emphysema: Secondary | ICD-10-CM | POA: Insufficient documentation

## 2011-06-03 DIAGNOSIS — F17200 Nicotine dependence, unspecified, uncomplicated: Secondary | ICD-10-CM | POA: Insufficient documentation

## 2011-06-03 DIAGNOSIS — Z01812 Encounter for preprocedural laboratory examination: Secondary | ICD-10-CM | POA: Insufficient documentation

## 2011-06-03 DIAGNOSIS — F411 Generalized anxiety disorder: Secondary | ICD-10-CM | POA: Insufficient documentation

## 2011-06-03 DIAGNOSIS — E119 Type 2 diabetes mellitus without complications: Secondary | ICD-10-CM | POA: Insufficient documentation

## 2011-06-03 DIAGNOSIS — N3080 Other cystitis without hematuria: Secondary | ICD-10-CM | POA: Insufficient documentation

## 2011-06-03 DIAGNOSIS — N949 Unspecified condition associated with female genital organs and menstrual cycle: Secondary | ICD-10-CM | POA: Insufficient documentation

## 2011-06-03 LAB — POCT I-STAT 4, (NA,K, GLUC, HGB,HCT)
Glucose, Bld: 60 mg/dL — ABNORMAL LOW (ref 70–99)
Hemoglobin: 13.9 g/dL (ref 12.0–15.0)
Potassium: 4.1 mEq/L (ref 3.5–5.1)

## 2011-06-04 LAB — GLUCOSE, CAPILLARY: Glucose-Capillary: 156 mg/dL — ABNORMAL HIGH (ref 70–99)

## 2011-06-17 NOTE — Op Note (Signed)
  NAMEKARMEN, [name]              ACCOUNT NO.:  [id number]  MEDICAL RECORD NO.:  [id number]  LOCATION:                               FACILITY:  [facility]  PHYSICIAN:  Mandy Sinner, MD DATE OF BIRTH:  [birth year]  DATE OF PROCEDURE: DATE OF DISCHARGE:                              OPERATIVE REPORT   PREOPERATIVE DIAGNOSIS:  Pelvic pain; cystitis cystica.  POSTOPERATIVE DIAGNOSIS:  Pelvic pain; cystitis cystica.  SURGERY:  Cystoscopy.  Bladder hydrodistention, bladder instillation therapy.  Ms. Wire has a clinical presentation of interstitial cystitis and she is here for hydrodistention.  The patient is prepped and draped in usual fashion.  A 21 [place] scope was utilized.  Bladder mucosa and trigone were normal.  There is no stitch, foreign body, or carcinoma.  Bladder capacity under anesthesia was approximately 900 mL.  I stopped because of volume.  I emptied the bladder and reinspected.  There was no glomerulations or findings to keep with interstitial cystitis.  Repeat the same procedure and held it for 4 to 5 minutes.  I reset it after emptying the bladder again it was negative.  In my opinion, she has cystoscopic findings of cystitis cystica, supporting a diagnosis of recurrent bacterial cystitis.  A daily trimethoprim and Macrodantin will likely play a role in treatment. Clinically, thus far I will not diagnosis her with interstitial cystitis.          ______________________________ Mandy Sinner, MD     SAM/MEDQ  D:  06/03/2011  T:  06/03/2011  Job:  [id number]  Electronically Signed by Alfredo Martinez MD on 06/17/2011 09:48:35 AM

## 2011-06-27 LAB — HEPATIC FUNCTION PANEL
ALT: 12
Alkaline Phosphatase: 86
Indirect Bilirubin: 0.4
Total Protein: 6.6

## 2011-06-27 LAB — DIFFERENTIAL
Basophils Absolute: 0
Eosinophils Relative: 1
Lymphocytes Relative: 26
Neutro Abs: 6
Neutrophils Relative %: 66

## 2011-06-27 LAB — BASIC METABOLIC PANEL
BUN: 7
Calcium: 9
Creatinine, Ser: 0.62
GFR calc non Af Amer: >60
Glucose, Bld: 215 — ABNORMAL HIGH

## 2011-06-27 LAB — RAPID URINE DRUG SCREEN, HOSP PERFORMED
Barbiturates: NOT DETECTED
Cocaine: NOT DETECTED

## 2011-06-27 LAB — CBC
HCT: 41.3
HCT: 37.1
MCV: 89.8
Platelets: 378
Platelets: 325
RBC: 4.13
RDW: 13
WBC: 6.2

## 2011-06-27 LAB — URINALYSIS, ROUTINE W REFLEX MICROSCOPIC
Bilirubin Urine: NEGATIVE
Nitrite: NEGATIVE
Urobilinogen, UA: 0.2

## 2011-06-27 LAB — ETHANOL: Alcohol, Ethyl (B): <5

## 2011-06-27 LAB — COMPREHENSIVE METABOLIC PANEL
BUN: 9
CO2: 32
Calcium: 9.3
Chloride: 103
Creatinine, Ser: 0.82
GFR calc Af Amer: >60
GFR calc non Af Amer: >60
Glucose, Bld: 228 — ABNORMAL HIGH
Total Bilirubin: 0.5

## 2011-09-01 ENCOUNTER — Emergency Department (HOSPITAL_COMMUNITY): Payer: Medicare Other

## 2011-09-01 ENCOUNTER — Other Ambulatory Visit: Payer: Self-pay

## 2011-09-01 ENCOUNTER — Encounter (HOSPITAL_COMMUNITY): Payer: Self-pay | Admitting: *Deleted

## 2011-09-01 ENCOUNTER — Emergency Department (HOSPITAL_COMMUNITY)
Admission: EM | Admit: 2011-09-01 | Discharge: 2011-09-02 | Disposition: A | Discharge status: Home / Self Care | Payer: Medicare Other | Attending: Emergency Medicine | Admitting: Emergency Medicine

## 2011-09-01 DIAGNOSIS — Z794 Long term (current) use of insulin: Secondary | ICD-10-CM | POA: Insufficient documentation

## 2011-09-01 DIAGNOSIS — R5381 Other malaise: Secondary | ICD-10-CM | POA: Insufficient documentation

## 2011-09-01 DIAGNOSIS — J3489 Other specified disorders of nose and nasal sinuses: Secondary | ICD-10-CM | POA: Insufficient documentation

## 2011-09-01 DIAGNOSIS — J40 Bronchitis, not specified as acute or chronic: Secondary | ICD-10-CM | POA: Insufficient documentation

## 2011-09-01 DIAGNOSIS — R5383 Other fatigue: Secondary | ICD-10-CM | POA: Insufficient documentation

## 2011-09-01 DIAGNOSIS — B9789 Other viral agents as the cause of diseases classified elsewhere: Secondary | ICD-10-CM | POA: Insufficient documentation

## 2011-09-01 DIAGNOSIS — Z79899 Other long term (current) drug therapy: Secondary | ICD-10-CM | POA: Insufficient documentation

## 2011-09-01 DIAGNOSIS — K219 Gastro-esophageal reflux disease without esophagitis: Secondary | ICD-10-CM | POA: Insufficient documentation

## 2011-09-01 DIAGNOSIS — E119 Type 2 diabetes mellitus without complications: Secondary | ICD-10-CM | POA: Insufficient documentation

## 2011-09-01 DIAGNOSIS — J4489 Other specified chronic obstructive pulmonary disease: Secondary | ICD-10-CM | POA: Insufficient documentation

## 2011-09-01 DIAGNOSIS — R059 Cough, unspecified: Secondary | ICD-10-CM | POA: Insufficient documentation

## 2011-09-01 DIAGNOSIS — R112 Nausea with vomiting, unspecified: Secondary | ICD-10-CM | POA: Insufficient documentation

## 2011-09-01 DIAGNOSIS — R109 Unspecified abdominal pain: Secondary | ICD-10-CM | POA: Insufficient documentation

## 2011-09-01 DIAGNOSIS — R05 Cough: Secondary | ICD-10-CM | POA: Insufficient documentation

## 2011-09-01 DIAGNOSIS — E039 Hypothyroidism, unspecified: Secondary | ICD-10-CM | POA: Insufficient documentation

## 2011-09-01 DIAGNOSIS — R51 Headache: Secondary | ICD-10-CM | POA: Insufficient documentation

## 2011-09-01 DIAGNOSIS — J449 Chronic obstructive pulmonary disease, unspecified: Secondary | ICD-10-CM | POA: Insufficient documentation

## 2011-09-01 DIAGNOSIS — B349 Viral infection, unspecified: Secondary | ICD-10-CM

## 2011-09-01 DIAGNOSIS — R0789 Other chest pain: Secondary | ICD-10-CM | POA: Insufficient documentation

## 2011-09-01 LAB — GLUCOSE, CAPILLARY: Glucose-Capillary: 81 mg/dL (ref 70–99)

## 2011-09-01 LAB — POCT I-STAT TROPONIN I: Troponin i, poc: 0 ng/mL (ref 0.00–0.08)

## 2011-09-01 LAB — CBC
HCT: 35.4 % — ABNORMAL LOW (ref 36.0–46.0)
Hemoglobin: 12.3 g/dL (ref 12.0–15.0)
MCHC: 34.7 g/dL (ref 30.0–36.0)
WBC: 9 10*3/uL (ref 4.0–10.5)

## 2011-09-01 MED ORDER — IPRATROPIUM BROMIDE 0.02 % IN SOLN
0.5000 mg | Freq: Once | RESPIRATORY_TRACT | Status: AC
  Administered 2011-09-01: 0.5 mg via RESPIRATORY_TRACT
  Filled 2011-09-01: qty 2.5

## 2011-09-01 MED ORDER — SODIUM CHLORIDE 0.9 % IV BOLUS (SEPSIS)
1000.0000 mL | Freq: Once | INTRAVENOUS | Status: AC
  Administered 2011-09-01: 1000 mL via INTRAVENOUS

## 2011-09-01 MED ORDER — ALBUTEROL SULFATE (5 MG/ML) 0.5% IN NEBU
5.0000 mg | INHALATION_SOLUTION | Freq: Once | RESPIRATORY_TRACT | Status: AC
  Administered 2011-09-01: 5 mg via RESPIRATORY_TRACT
  Filled 2011-09-01: qty 1

## 2011-09-01 MED ORDER — MORPHINE SULFATE 4 MG/ML IJ SOLN
4.0000 mg | Freq: Once | INTRAMUSCULAR | Status: AC
  Administered 2011-09-01: 4 mg via INTRAVENOUS
  Filled 2011-09-01: qty 1

## 2011-09-01 MED ORDER — ONDANSETRON HCL 4 MG/2ML IJ SOLN
4.0000 mg | Freq: Once | INTRAMUSCULAR | Status: AC
  Administered 2011-09-01: 4 mg via INTRAVENOUS
  Filled 2011-09-01: qty 2

## 2011-09-01 NOTE — ED Notes (Signed)
Sick since saturday

## 2011-09-01 NOTE — ED Provider Notes (Signed)
Scribed for [place] Bailiff, MD, the patient was seen in room APA02/APA02 . This chart was scribed by Ellie Lunch.   CSN: [id number] Arrival date & time: 09/01/2011 10:19 PM   First MD Initiated Contact with Patient 09/01/11 2252      Chief Complaint  Patient presents with  . Hyperglycemia  . Chest Pain  . Headache  . Nausea    (Consider location/radiation/quality/duration/timing/severity/associated sxs/prior treatment) HPI Pt seen at 11:00 PM PAW KARSTENS is a 50 y.o. female brought in by EMS who presents to the Emergency Department complaining 2 days of constant chest tightness with associated cough and congestion. States that she has had a viral illness for the past 2 days.  Tightness is currently rated 8/10 in severity. Pt c/o associated nausea and HA and says she recently had a cold.  She has had associated nausea and multiple episodes of nbnb emesis.  Mild diffuse abdominal pain.  No urinary sx.  States that her BG has been high.  There are no other associated symptoms and no other alleviating or aggravating factors.    Past Medical History  Diagnosis Date  . Hypothyroid   . Anxiety   . Diabetes mellitus     insulin  . COPD (chronic obstructive pulmonary disease)   . GERD (gastroesophageal reflux disease)     Past Surgical History  Procedure Date  . Abdominal hysterectomy   . Tonsillectomy   . Appendectomy   . Bladder repair     History reviewed. No pertinent family history.  History  Substance Use Topics  . Smoking status: Current Everyday Smoker -- 0.5 packs/day    Types: Cigarettes  . Smokeless tobacco: Not on file  . Alcohol Use: No    Review of Systems  Constitutional: Positive for fatigue. Negative for fever, chills, activity change and appetite change.  HENT: Positive for congestion. Negative for sore throat, rhinorrhea, neck pain and neck stiffness.   Respiratory: Positive for cough and chest tightness. Negative for shortness of breath.     Gastrointestinal: Positive for nausea, vomiting and abdominal pain. Negative for diarrhea and constipation.  Genitourinary: Negative for dysuria, urgency, frequency and flank pain.  Neurological: Negative for dizziness, weakness, light-headedness, numbness and headaches.  All other systems reviewed and are negative.   10 Systems reviewed and are negative for acute change except as noted in the HPI.  Allergies  Dilantin; Phenytoin; and Tetracyclines & related  Home Medications   Current Outpatient Rx  Name Route Sig Dispense Refill  . ALPRAZOLAM 1 MG PO TABS Oral Take 1 mg by mouth 4 (four) times daily as needed. SLEEP AND ANXIETY    . ATORVASTATIN CALCIUM 20 MG PO TABS Oral Take 20 mg by mouth daily.      Marland Kitchen ESCITALOPRAM OXALATE 10 MG PO TABS Oral Take 10 mg by mouth daily.      Marland Kitchen ESOMEPRAZOLE MAGNESIUM 40 MG PO CPDR Oral Take 40 mg by mouth daily before breakfast.      . INSULIN ASPART 100 UNIT/ML [place] SOLN Intravenous (Continuous Infusion) by Intravenous (Continuous Infusion) route continuous.     Marland Kitchen LEVOTHYROXINE SODIUM 137 MCG PO TABS Oral Take 137 mcg by mouth every morning.      Marland Kitchen NITROFURANTOIN MACROCRYSTAL 100 MG PO CAPS Oral Take 100 mg by mouth at bedtime.      Marland Kitchen PROMETHAZINE HCL 25 MG PO TABS Oral Take 25-50 mg by mouth every 6 (six) hours as needed. FOR NAUSEA     . TRAZODONE  HCL 100 MG PO TABS Oral Take 100 mg by mouth at bedtime.      . ALBUTEROL SULFATE (2.5 MG/3ML) 0.083% IN NEBU Nebulization Take 2.5 mg by nebulization every 6 (six) hours as needed. FOR SHORTNESS OF BREATH    . PROMETHAZINE HCL 25 MG PO TABS Oral Take 1 tablet (25 mg total) by mouth every 6 (six) hours as needed for nausea. 20 tablet 0    BP 113/53  Pulse 86  Temp(Src) 97.8 F (36.6 C) (Oral)  Resp 20  Ht 5\' 6"  (1.676 m)  Wt 165 lb (74.844 kg)  BMI 26.63 kg/m2  SpO2 97%  Physical Exam  Nursing note and vitals reviewed. Constitutional: She is oriented to person, place, and time. She appears  well-developed and well-nourished. No distress.  HENT:  Head: Normocephalic and atraumatic.  Mouth/Throat: Oropharynx is clear and moist.  Eyes: Conjunctivae and EOM are normal. Pupils are equal, round, and reactive to light.  Neck: Normal range of motion. Neck supple.  Cardiovascular: Normal rate, regular rhythm, normal heart sounds and intact distal pulses.   No murmur heard. Pulmonary/Chest: Effort normal. She has wheezes (diffuse light bilateral). She exhibits no tenderness.  Abdominal: Soft. Bowel sounds are normal. There is no tenderness.  Musculoskeletal: Normal range of motion.  Neurological: She is alert and oriented to person, place, and time.  Skin: Skin is warm and dry.  Psychiatric: She has a normal mood and affect.    ED Course  Procedures (including critical care time)   Date: 09/01/2011  Rate: 77  Rhythm: normal sinus rhythm  QRS Axis: normal  Intervals: normal  ST/T Wave abnormalities: normal  Conduction Disutrbances:none  Narrative Interpretation:   Old EKG Reviewed: none available  DIAGNOSTIC STUDIES: Oxygen Saturation is 97% on room air, normal by my interpretation.    COORDINATION OF CARE:  Labs Reviewed  CBC - Abnormal; Notable for the following:    HCT 35.4 (*)    All other components within normal limits  BASIC METABOLIC PANEL - Abnormal; Notable for the following:    Potassium 3.2 (*)    Glucose, Bld 122 (*)    GFR calc non Af Amer 70 (*)    GFR calc Af Amer 82 (*)    All other components within normal limits  GLUCOSE, CAPILLARY - Abnormal; Notable for the following:    Glucose-Capillary 265 (*)    All other components within normal limits  GLUCOSE, CAPILLARY - Abnormal; Notable for the following:    Glucose-Capillary 112 (*)    All other components within normal limits  POCT I-STAT TROPONIN I  GLUCOSE, CAPILLARY  GLUCOSE, CAPILLARY  POCT CBG MONITORING  I-STAT TROPONIN I  URINALYSIS, ROUTINE W REFLEX MICROSCOPIC   Dg Chest 2  View  09/02/2011  *RADIOLOGY REPORT*  Clinical Data: Tightness in chest pain.  Wheezing.  Smoker.  CHEST - 2 VIEW  Comparison: 01/01/2011  Findings: Mild emphysematous changes. The heart size and pulmonary vascularity are normal. The lungs appear clear and expanded without focal air space disease or consolidation. No blunting of the costophrenic angles.  No significant changes since the previous study.  IMPRESSION: No evidence of active pulmonary disease.  Original Report Authenticated By: Marlon Pel, M.D.    ED MEDICATIONS Medications  albuterol (PROVENTIL) (5 MG/ML) 0.5% nebulizer solution 5 mg   ipratropium (ATROVENT) nebulizer solution 0.5 mg   sodium chloride 0.9 % bolus 1,000 mL   ondansetron (ZOFRAN) injection 4 mg   morphine 4 MG/ML injection  4 mg     1. Viral illness   2. Bronchitis    1:47 AM The patient improved on reassessment and wishes to be discharged home. I will discharge her home with albuterol to be used every 4 hours. I will also discharge her home with antiemetic.   MDM  Symptoms secondary to a viral illness as well as bronchitis. I will avoid steroids at the patient's request as she is concerned about her hyperglycemia although normal in the emergency department. She'll be discharged home with albuterol to be used every 4 hours as needed. I will also discharge her home with antiemetics. I encouraged aggressive oral hydration at home with Gatorade or water. I instructed her to avoid sodas and juice. She's provided clear signs and symptoms for which to return the emergency department at this time the patient overall is better and is stable for discharge to home.  I personally performed the services described in this documentation, which was scribed in my presence. The recorded information has been reviewed and considered.         [place] Bailiff, MD 09/02/11 [phone]

## 2011-09-02 LAB — URINE MICROSCOPIC-ADD ON

## 2011-09-02 LAB — GLUCOSE, CAPILLARY: Glucose-Capillary: 112 mg/dL — ABNORMAL HIGH (ref 70–99)

## 2011-09-02 LAB — URINALYSIS, ROUTINE W REFLEX MICROSCOPIC
Bilirubin Urine: NEGATIVE
Glucose, UA: >1000 mg/dL — AB
Ketones, ur: NEGATIVE mg/dL
Leukocytes, UA: NEGATIVE
Protein, ur: NEGATIVE mg/dL
pH: 6 (ref 5.0–8.0)

## 2011-09-02 LAB — BASIC METABOLIC PANEL
BUN: 17 mg/dL (ref 6–23)
Chloride: 106 mEq/L (ref 96–112)
GFR calc Af Amer: 82 mL/min — ABNORMAL LOW (ref >90)
GFR calc non Af Amer: 70 mL/min — ABNORMAL LOW (ref >90)
Glucose, Bld: 122 mg/dL — ABNORMAL HIGH (ref 70–99)
Potassium: 3.2 mEq/L — ABNORMAL LOW (ref 3.5–5.1)
Sodium: 139 mEq/L (ref 135–145)

## 2011-09-02 MED ORDER — POTASSIUM CHLORIDE CRYS ER 20 MEQ PO TBCR
40.0000 meq | EXTENDED_RELEASE_TABLET | Freq: Once | ORAL | Status: AC
  Administered 2011-09-02: 40 meq via ORAL
  Filled 2011-09-02: qty 2

## 2011-09-02 MED ORDER — OXYCODONE-ACETAMINOPHEN 5-325 MG PO TABS
2.0000 | ORAL_TABLET | Freq: Once | ORAL | Status: AC
  Administered 2011-09-02: 2 via ORAL
  Filled 2011-09-02: qty 1

## 2011-09-02 MED ORDER — OXYCODONE-ACETAMINOPHEN 5-325 MG PO TABS
ORAL_TABLET | ORAL | Status: AC
  Filled 2011-09-02: qty 1

## 2011-09-02 MED ORDER — ONDANSETRON HCL 4 MG/2ML IJ SOLN
4.0000 mg | Freq: Once | INTRAMUSCULAR | Status: AC
  Administered 2011-09-02: 4 mg via INTRAVENOUS
  Filled 2011-09-02: qty 2

## 2011-09-02 MED ORDER — AEROCHAMBER Z-STAT PLUS/MEDIUM MISC: 1.0000 | Freq: Once | Status: DC

## 2011-09-02 MED ORDER — PROMETHAZINE HCL 25 MG PO TABS: 25.0000 mg | ORAL_TABLET | Freq: Four times a day (QID) | ORAL | Status: DC | PRN

## 2011-09-02 MED ORDER — ALBUTEROL SULFATE HFA 108 (90 BASE) MCG/ACT IN AERS
2.0000 | INHALATION_SPRAY | RESPIRATORY_TRACT | Status: DC | PRN
  Filled 2011-09-02: qty 6.7

## 2012-01-26 DIAGNOSIS — E039 Hypothyroidism, unspecified: Secondary | ICD-10-CM | POA: Diagnosis not present

## 2012-01-26 DIAGNOSIS — E119 Type 2 diabetes mellitus without complications: Secondary | ICD-10-CM | POA: Diagnosis not present

## 2012-01-26 DIAGNOSIS — E7800 Pure hypercholesterolemia, unspecified: Secondary | ICD-10-CM | POA: Diagnosis not present

## 2012-01-26 DIAGNOSIS — I10 Essential (primary) hypertension: Secondary | ICD-10-CM | POA: Diagnosis not present

## 2012-01-27 ENCOUNTER — Telehealth: Payer: Self-pay

## 2012-01-27 NOTE — Telephone Encounter (Signed)
LMOM for pt to call. ( Letter mailed also).

## 2012-01-27 NOTE — Telephone Encounter (Signed)
Pt returned call. Said she has some episodes of vomiting blood sometimes and has indigestion really bad. Scheduled for ov appt on 01/28/2012 with AS at 1:30 PM.

## 2012-01-28 ENCOUNTER — Ambulatory Visit (INDEPENDENT_AMBULATORY_CARE_PROVIDER_SITE_OTHER): Payer: Medicare Other | Admitting: Gastroenterology

## 2012-01-28 ENCOUNTER — Encounter: Payer: Self-pay | Admitting: Gastroenterology

## 2012-01-28 VITALS — BP 123/67 | HR 93 | Temp 98.6°F | Ht 65.0 in | Wt 167.4 lb

## 2012-01-28 DIAGNOSIS — K920 Hematemesis: Secondary | ICD-10-CM

## 2012-01-28 DIAGNOSIS — Z1211 Encounter for screening for malignant neoplasm of colon: Secondary | ICD-10-CM | POA: Diagnosis not present

## 2012-01-28 DIAGNOSIS — K219 Gastro-esophageal reflux disease without esophagitis: Secondary | ICD-10-CM

## 2012-01-28 DIAGNOSIS — R1013 Epigastric pain: Secondary | ICD-10-CM

## 2012-01-28 MED ORDER — ESOMEPRAZOLE MAGNESIUM 40 MG PO CPDR: 40.0000 mg | DELAYED_RELEASE_CAPSULE | Freq: Two times a day (BID) | ORAL | Status: DC

## 2012-01-28 NOTE — Progress Notes (Signed)
Primary Care Physician:  Avon Gully, MD, MD Primary Gastroenterologist:  Dr. Jena Gauss   Chief Complaint  Patient presents with  . Colonoscopy  . Emesis    blood/    HPI:   [name] is a 51 year old female who presents today at the request of Dr. Felecia Shelling to schedule an initial screening colonoscopy. She reports a possible upper endoscopy in remote past, thinks Dr. Karilyn Cota. However, no EGD reports are available from [facility] or [facility]. Unclear if this was actually performed by Dr. Karilyn Cota.   Notes intermittent nausea for 5 years. Worse at night, worsening past few weeks. Notes episode of coffee-ground in past few weeks and large-volume hematemesis past Saturday. No other episodes since. Notes vomiting daily. On Phenergan, Nexium. Associated epigastric pain, for a few years, intermittently, also worsening in past few weeks. Afraid to eat. Feels reflux worsening, feels it "all the way up to top of my head". Possible melena, no brbpr. Constipation predominant symptoms, occasional diarrhea. Denies NSAIDs, aspirin powders, ETOH. Notes intermittent dysphagia as well.   Dad in hospice, both parents have cancer. All "neglecting ourselves".     Past Medical History  Diagnosis Date  . Hypothyroid   . Anxiety   . Diabetes mellitus     insulin  . COPD (chronic obstructive pulmonary disease)   . GERD (gastroesophageal reflux disease)   . PUD (peptic ulcer disease)     ? remote past, no reports from [place] or [facility], states possibly Dr. Karilyn Cota.     Past Surgical History  Procedure Date  . Abdominal hysterectomy   . Tonsillectomy   . Appendectomy   . Bladder repair   . Esophagogastroduodenoscopy     in remote past, unclear who performed. No op notes from [facility] or [facility] (pt thought Dr. Karilyn Cota)    Current Outpatient Prescriptions  Medication Sig Dispense Refill  . albuterol (PROVENTIL) (2.5 MG/3ML) 0.083% nebulizer solution Take 2.5 mg by nebulization every 6 (six) hours as needed. FOR  SHORTNESS OF BREATH      . ALPRAZolam (XANAX) 1 MG tablet Take 1 mg by mouth 4 (four) times daily as needed. SLEEP AND ANXIETY      . escitalopram (LEXAPRO) 10 MG tablet Take 10 mg by mouth daily.        Marland Kitchen esomeprazole (NEXIUM) 40 MG capsule Take 1 capsule (40 mg total) by mouth 2 (two) times daily before a meal.  60 capsule  3  . insulin aspart (NOVOLOG) 100 UNIT/ML injection by Intravenous (Continuous Infusion) route continuous. SSI      . insulin glargine (LANTUS) 100 UNIT/ML injection Inject 14 Units into the skin 2 (two) times daily. 14 units in am and  14 units in pm      . levothyroxine (SYNTHROID, LEVOTHROID) 137 MCG tablet Take 137 mcg by mouth every morning.        . metoCLOPramide (REGLAN) 5 MG tablet Take 5 mg by mouth 4 (four) times daily.      . nitrofurantoin (MACRODANTIN) 100 MG capsule Take 100 mg by mouth at bedtime.        . promethazine (PHENERGAN) 25 MG tablet Take 25-50 mg by mouth every 6 (six) hours as needed. FOR NAUSEA       . traZODone (DESYREL) 100 MG tablet Take 100 mg by mouth at bedtime.        Marland Kitchen DISCONTD: esomeprazole (NEXIUM) 40 MG capsule Take 40 mg by mouth daily before breakfast.        . promethazine (PHENERGAN)  25 MG tablet Take 1 tablet (25 mg total) by mouth every 6 (six) hours as needed for nausea.  20 tablet  0    Allergies as of 01/28/2012 - Review Complete 01/28/2012  Allergen Reaction Noted  . Phenytoin  03/21/2010  . Phenytoin sodium extended Other (See Comments) 04/16/2011  . Tetracyclines & related Nausea Only 09/01/2011    Family History  Problem Relation Age of Onset  . Liver cancer Mother     remission for 2 years  . Colon cancer Neg Hx     History   Social History  . Marital Status: Divorced    Spouse Name: N/A    Number of Children: N/A  . Years of Education: N/A   Occupational History  . Not on file.   Social History Main Topics  . Smoking status: Current Everyday Smoker -- 0.5 packs/day    Types: Cigarettes  .  Smokeless tobacco: Not on file  . Alcohol Use: No  . Drug Use: No  . Sexually Active: Not on file   Other Topics Concern  . Not on file   Social History Narrative  . No narrative on file    Review of Systems: Gen: SEE HPI  CV: Denies chest pain, heart palpitations, peripheral edema, syncope.  Resp: Denies shortness of breath at rest or with exertion. Denies wheezing or cough.  GI: SEE HPI GU : Denies urinary burning, urinary frequency, urinary hesitancy MS: Denies joint pain, muscle weakness, cramps, or limitation of movement.  Derm: Denies rash, itching, dry skin Psych: Denies depression, anxiety, memory loss, and confusion Heme: Denies bruising, bleeding, and enlarged lymph nodes.  Physical Exam: BP 123/67  Pulse 93  Temp(Src) 98.6 F (37 C) (Temporal)  Ht 5\' 5"  (1.651 m)  Wt 167 lb 6.4 oz (75.932 kg)  BMI 27.86 kg/m2 General:   Alert and oriented. Pleasant and cooperative. Well-nourished and well-developed.  Head:  Normocephalic and atraumatic. Eyes:  Without icterus, sclera clear and conjunctiva pink.  Ears:  Normal auditory acuity. Nose:  No deformity, discharge,  or lesions. Mouth:  No deformity or lesions, oral mucosa pink.  Neck:  Supple, without mass or thyromegaly. Lungs:  Clear to auscultation bilaterally. No wheezes, rales, or rhonchi. No distress.  Heart:  S1, S2 present without murmurs appreciated.  Abdomen:  +BS, soft, TTP epigastric and non-distended. No HSM noted. No guarding or rebound. No masses appreciated.  Rectal:  Deferred  Msk:  Symmetrical without gross deformities. Normal posture. Pulses:  Normal pulses noted. Extremities:  Without clubbing or edema. Neurologic:  Alert and  oriented x4;  grossly normal neurologically. Skin:  Intact without significant lesions or rashes. Cervical Nodes:  No significant cervical adenopathy. Psych:  Alert and cooperative. Normal mood and affect.

## 2012-01-28 NOTE — Patient Instructions (Signed)
Increase Nexium to twice a day, 30 minutes before breakfast and dinner.  Follow a bland diet, clear liquids for now.   Please have blood work completed. We will call you with the results.  We will proceed with an upper endoscopy as soon as we can; after this is completed, we will need to bring you back to our office to set up a colonoscopy. However, I do not believe you would tolerate the prep due to your nausea and vomiting.  Seek medical attention immediately if uncontrolled nausea, vomiting, bloody vomit.

## 2012-01-29 ENCOUNTER — Encounter: Payer: Self-pay | Admitting: Gastroenterology

## 2012-01-29 DIAGNOSIS — R1013 Epigastric pain: Secondary | ICD-10-CM | POA: Diagnosis not present

## 2012-01-29 DIAGNOSIS — K219 Gastro-esophageal reflux disease without esophagitis: Secondary | ICD-10-CM | POA: Insufficient documentation

## 2012-01-29 DIAGNOSIS — Z1211 Encounter for screening for malignant neoplasm of colon: Secondary | ICD-10-CM | POA: Insufficient documentation

## 2012-01-29 DIAGNOSIS — K920 Hematemesis: Secondary | ICD-10-CM | POA: Insufficient documentation

## 2012-01-29 NOTE — Assessment & Plan Note (Signed)
No prior TCS. Needs in near future, but I am doubtful she would tolerate the prep due to acute issue of epigastric pain, n/v. Will be seen in office for f/u after EGD, plan TCS at that time.

## 2012-01-29 NOTE — Assessment & Plan Note (Signed)
Question MW tear due to repeated vomiting; however, distant reported hx of PUD concerning. Unable to r/o other etiology. EGD as planned, to ED if worsening symptoms. CBC today.

## 2012-01-29 NOTE — Assessment & Plan Note (Signed)
Worsening. Increase Nexium to BID. EGD as planned due to epigastric pain, hematemesis.

## 2012-01-29 NOTE — Progress Notes (Unsigned)
Please have pt follow-up with [place] first week of June. Needs to set up TCS.

## 2012-01-29 NOTE — Assessment & Plan Note (Signed)
51 year old female with reported several year history of epigastric pain, worsening over past few weeks. Associated with N/V, episode of coffee-ground emesis and episode of brb, no further episodes since last week. Denies NSAIDs, aspirin powders, ETOH use. Possible hx of PUD in past per pt; she does believe she has had an upper GI evaluation, but it is unclear which GI physician performed this as no records are available. Afraid to eat due to pain, nausea/vomiting. GERD severe. Notes vague intermittent dysphagia as well. Needs upper GI evaluation, check CBC due to recent hematemesis.   Proceed with upper endoscopy/dilation in the near future with Dr. Jena Gauss. The risks, benefits, and alternatives have been discussed in detail with patient. They have stated understanding and desire to proceed.  Utilize Phenergan 25 mg on call  Increase Nexium to BID Phenergan prn Clear liquids, soft foods CBC, HFP To ED if worsening symptoms

## 2012-01-29 NOTE — Progress Notes (Signed)
Faxed to PCP

## 2012-01-30 LAB — CBC WITH DIFFERENTIAL/PLATELET
Basophils Absolute: 0 10*3/uL (ref 0.0–0.1)
Basophils Relative: 0 % (ref 0–1)
Eosinophils Absolute: 0.1 10*3/uL (ref 0.0–0.7)
MCH: 31 pg (ref 26.0–34.0)
MCHC: 33.9 g/dL (ref 30.0–36.0)
Neutro Abs: 4.4 10*3/uL (ref 1.7–7.7)
Neutrophils Relative %: 62 % (ref 43–77)
RDW: 13.5 % (ref 11.5–15.5)

## 2012-01-30 LAB — HEPATIC FUNCTION PANEL
ALT: 12 U/L (ref 0–35)
AST: 15 U/L (ref 0–37)
Albumin: 4.4 g/dL (ref 3.5–5.2)
Bilirubin, Direct: 0.1 mg/dL (ref 0.0–0.3)
Total Bilirubin: 0.3 mg/dL (ref 0.3–1.2)

## 2012-01-31 NOTE — Progress Notes (Signed)
Quick Note:  No anemia. HFP with very, very mildly elevated AP.  Can repeat this in 4-6 weeks (or at f/u OV), make sure fasting. Likely not pertinent to situation currently. ______

## 2012-02-09 ENCOUNTER — Encounter (HOSPITAL_COMMUNITY): Payer: Self-pay | Admitting: Pharmacy Technician

## 2012-02-09 ENCOUNTER — Telehealth: Payer: Self-pay | Admitting: Internal Medicine

## 2012-02-09 MED ORDER — ESOMEPRAZOLE MAGNESIUM 40 MG PO CPDR: 40.0000 mg | DELAYED_RELEASE_CAPSULE | Freq: Two times a day (BID) | ORAL | Status: DC

## 2012-02-09 NOTE — Telephone Encounter (Signed)
Addended by: Joselyn Arrow on: 02/09/2012 10:38 AM   Modules accepted: Orders

## 2012-02-09 NOTE — Telephone Encounter (Signed)
[name] had told the patient to take 2 Nexium a day and shes needing a new RX sent to [facility]

## 2012-02-09 NOTE — Telephone Encounter (Signed)
Routed to refill box 

## 2012-02-11 DIAGNOSIS — F319 Bipolar disorder, unspecified: Secondary | ICD-10-CM | POA: Diagnosis not present

## 2012-02-12 ENCOUNTER — Ambulatory Visit (HOSPITAL_COMMUNITY)
Admission: RE | Admit: 2012-02-12 | Discharge: 2012-02-12 | Disposition: A | Discharge status: Home / Self Care | Payer: Medicare Other | Source: Ambulatory Visit | Attending: Internal Medicine | Admitting: Internal Medicine

## 2012-02-12 ENCOUNTER — Other Ambulatory Visit: Payer: Self-pay | Admitting: Internal Medicine

## 2012-02-12 ENCOUNTER — Encounter (HOSPITAL_COMMUNITY): Payer: Self-pay | Admitting: *Deleted

## 2012-02-12 ENCOUNTER — Encounter (HOSPITAL_COMMUNITY)
Admission: RE | Disposition: A | Discharge status: Home / Self Care | Payer: Self-pay | Source: Ambulatory Visit | Attending: Internal Medicine

## 2012-02-12 ENCOUNTER — Telehealth: Payer: Self-pay | Admitting: Internal Medicine

## 2012-02-12 DIAGNOSIS — K219 Gastro-esophageal reflux disease without esophagitis: Secondary | ICD-10-CM

## 2012-02-12 DIAGNOSIS — R1310 Dysphagia, unspecified: Secondary | ICD-10-CM | POA: Insufficient documentation

## 2012-02-12 DIAGNOSIS — K2940 Chronic atrophic gastritis without bleeding: Secondary | ICD-10-CM | POA: Diagnosis not present

## 2012-02-12 DIAGNOSIS — E119 Type 2 diabetes mellitus without complications: Secondary | ICD-10-CM | POA: Insufficient documentation

## 2012-02-12 DIAGNOSIS — Z794 Long term (current) use of insulin: Secondary | ICD-10-CM | POA: Diagnosis not present

## 2012-02-12 DIAGNOSIS — R112 Nausea with vomiting, unspecified: Secondary | ICD-10-CM | POA: Insufficient documentation

## 2012-02-12 DIAGNOSIS — J449 Chronic obstructive pulmonary disease, unspecified: Secondary | ICD-10-CM | POA: Diagnosis not present

## 2012-02-12 DIAGNOSIS — R1110 Vomiting, unspecified: Secondary | ICD-10-CM | POA: Diagnosis not present

## 2012-02-12 DIAGNOSIS — R1013 Epigastric pain: Secondary | ICD-10-CM | POA: Insufficient documentation

## 2012-02-12 DIAGNOSIS — J4489 Other specified chronic obstructive pulmonary disease: Secondary | ICD-10-CM | POA: Insufficient documentation

## 2012-02-12 DIAGNOSIS — Z01812 Encounter for preprocedural laboratory examination: Secondary | ICD-10-CM | POA: Insufficient documentation

## 2012-02-12 DIAGNOSIS — K2990 Gastroduodenitis, unspecified, without bleeding: Secondary | ICD-10-CM | POA: Diagnosis not present

## 2012-02-12 DIAGNOSIS — R933 Abnormal findings on diagnostic imaging of other parts of digestive tract: Secondary | ICD-10-CM | POA: Diagnosis not present

## 2012-02-12 DIAGNOSIS — K2970 Gastritis, unspecified, without bleeding: Secondary | ICD-10-CM | POA: Diagnosis not present

## 2012-02-12 HISTORY — PX: ESOPHAGOGASTRODUODENOSCOPY: SHX1529

## 2012-02-12 LAB — LIPASE, BLOOD: Lipase: 7 U/L — ABNORMAL LOW (ref 11–59)

## 2012-02-12 LAB — GLUCOSE, CAPILLARY

## 2012-02-12 SURGERY — ESOPHAGOGASTRODUODENOSCOPY (EGD) WITH ESOPHAGEAL DILATION
Anesthesia: Moderate Sedation

## 2012-02-12 MED ORDER — MIDAZOLAM HCL 5 MG/5ML IJ SOLN
INTRAMUSCULAR | Status: AC
  Filled 2012-02-12: qty 10

## 2012-02-12 MED ORDER — MEPERIDINE HCL 100 MG/ML IJ SOLN
INTRAMUSCULAR | Status: DC | PRN
  Administered 2012-02-12 (×2): 50 mg via INTRAVENOUS

## 2012-02-12 MED ORDER — SODIUM CHLORIDE 0.45 % IV SOLN
Freq: Once | INTRAVENOUS | Status: AC
  Administered 2012-02-12: 09:00:00 via INTRAVENOUS

## 2012-02-12 MED ORDER — PROMETHAZINE HCL 25 MG/ML IJ SOLN
INTRAMUSCULAR | Status: AC
  Filled 2012-02-12: qty 1

## 2012-02-12 MED ORDER — SIMETHICONE 40 MG/0.6ML PO SUSP
ORAL | Status: DC | PRN
  Administered 2012-02-12 (×2)

## 2012-02-12 MED ORDER — MIDAZOLAM HCL 5 MG/5ML IJ SOLN
INTRAMUSCULAR | Status: DC | PRN
  Administered 2012-02-12: 1 mg via INTRAVENOUS
  Administered 2012-02-12 (×2): 2 mg via INTRAVENOUS
  Administered 2012-02-12: 1 mg via INTRAVENOUS

## 2012-02-12 MED ORDER — BUTAMBEN-TETRACAINE-BENZOCAINE 2-2-14 % EX AERO
INHALATION_SPRAY | CUTANEOUS | Status: DC | PRN
  Administered 2012-02-12: 2 via TOPICAL

## 2012-02-12 MED ORDER — MEPERIDINE HCL 100 MG/ML IJ SOLN
INTRAMUSCULAR | Status: AC
  Filled 2012-02-12: qty 2

## 2012-02-12 MED ORDER — SODIUM CHLORIDE 0.9 % IJ SOLN
INTRAMUSCULAR | Status: AC
  Filled 2012-02-12: qty 10

## 2012-02-12 MED ORDER — PROMETHAZINE HCL 25 MG/ML IJ SOLN
25.0000 mg | Freq: Once | INTRAMUSCULAR | Status: AC
  Administered 2012-02-12: 25 mg via INTRAVENOUS

## 2012-02-12 NOTE — Op Note (Signed)
[facility] [address] [place], [place]  [address]  ENDOSCOPY PROCEDURE REPORT  PATIENT:  Mandy, [name]  MR#:  [id number] BIRTHDATE:  1960-12-20, 50 yrs. old  GENDER:  female  ENDOSCOPIST:  R. Roetta Sessions, MD Caleen Essex Referred by:  Glenice Laine, M.D.  PROCEDURE DATE:  02/12/2012 PROCEDURE:  EGD with Elease Hashimoto dilation followed by gastric biopsy  INDICATIONS:   epigastric pain nausea, vomiting/hematemesis. Esophageal dysphagia. Normal hemoglobin recently.  INFORMED CONSENT:   The risks, benefits, limitations, alternatives and imponderables have been discussed.  The potential for biopsy, esophogeal dilation, etc. have also been reviewed.  Questions have been answered.  All parties agreeable.  Please see the history and physical in the medical record for more information.  MEDICATIONS: Medications Versed 6 mg IV and Demerol 100 mg IV in divided doses. 25 mg IV to augment conscious sedation.  DESCRIPTION OF PROCEDURE:   The EG-[id number] ([id number]) endoscope was introduced through the mouth and advanced to the second portion of the duodenum without difficulty or limitations.  The mucosal surfaces were surveyed very carefully during advancement of the scope and upon withdrawal.  Retroflexion view of the proximal stomach and esophagogastric junction was performed.  <<PROCEDUREIMAGES>>  FINDINGS:   Normal patent tubular esophagus. Small hiatal hernia. Diffuse mottling and patchy erythema of the gastric mucosa.  No ulcer or infiltrating process. Patent pylorus. Normal first and second portion of the duodenum  THERAPEUTIC / DIAGNOSTIC MANEUVERS PERFORMED:  25 French Maloney dilator passed easily. A look back revealed  no apparent complication related to this maneuver. Subsequently, biopsies the gastric body and antrum were taken for histologic study.  COMPLICATIONS:   None  IMPRESSION:   Normal esophagus status post dilation as described above. Small hiatal hernia.  Abnormal gastric because of uncertain significance-status post biopsy.  RECOMMENDATIONS:   Serum lipase today. Proceed with gallbladder ultrasound. Further recommendations to follow. Follow up on pathology.  ______________________________ R. Roetta Sessions, MD Caleen Essex  CC:  n. eSIGNED:   R. Roetta Sessions at 02/12/2012 11:03 AM  Awanda Mink, [phone]

## 2012-02-12 NOTE — Interval H&P Note (Signed)
History and Physical Interval Note:  02/12/2012 10:29 AM  [name]  has presented today for surgery, with the diagnosis of GERD  The various methods of treatment have been discussed with the patient and family. After consideration of risks, benefits and other options for treatment, the patient has consented to  Procedure(s) (LRB): ESOPHAGOGASTRODUODENOSCOPY (EGD) WITH ESOPHAGEAL DILATION (N/A) as a surgical intervention .  The patients' history has been reviewed, patient examined, no change in status, stable for surgery.  I have reviewed the patients' chart and labs.  Questions were answered to the patient's satisfaction.     [name]

## 2012-02-12 NOTE — Telephone Encounter (Signed)
Patient is aware per Misty Stanley in Endo

## 2012-02-12 NOTE — Discharge Instructions (Addendum)
EGD Discharge instructions Please read the instructions outlined below and refer to this sheet in the next few weeks. These discharge instructions provide you with general information on caring for yourself after you leave the hospital. Your doctor may also give you specific instructions. While your treatment has been planned according to the most current medical practices available, unavoidable complications occasionally occur. If you have any problems or questions after discharge, please call your doctor. ACTIVITY  You may resume your regular activity but move at a slower pace for the next 24 hours.   Take frequent rest periods for the next 24 hours.   Walking will help expel (get rid of) the air and reduce the bloated feeling in your abdomen.   No driving for 24 hours (because of the anesthesia (medicine) used during the test).   You may shower.   Do not sign any important legal documents or operate any machinery for 24 hours (because of the anesthesia used during the test).  NUTRITION  Drink plenty of fluids.   You may resume your normal diet.   Begin with a light meal and progress to your normal diet.   Avoid alcoholic beverages for 24 hours or as instructed by your caregiver.  MEDICATIONS  You may resume your normal medications unless your caregiver tells you otherwise.  WHAT YOU CAN EXPECT TODAY  You may experience abdominal discomfort such as a feeling of fullness or "gas" pains.  FOLLOW-UP  Your doctor will discuss the results of your test with you.  SEEK IMMEDIATE MEDICAL ATTENTION IF ANY OF THE FOLLOWING OCCUR:  Excessive nausea (feeling sick to your stomach) and/or vomiting.   Severe abdominal pain and distention (swelling).   Trouble swallowing.   Temperature over 101 F (37.8 C).   Rectal bleeding or vomiting of blood.      Gallbladder ultrasound her further evaluate your abdominal pain.   Blood work today (serum lipase).  Further recommendations  to follow pending review of pathology report.  Hiatal Hernia A hiatal hernia occurs when a part of the stomach slides above the diaphragm. The diaphragm is the thin muscle separating the belly (abdomen) from the chest. A hiatal hernia can be something you are born with or develop over time. Hiatal hernias may allow stomach acid to flow back into your esophagus, the tube which carries food from your mouth to your stomach. If this acid causes problems it is called GERD (gastro-esophageal reflux disease).  SYMPTOMS  Common symptoms of GERD are heartburn (burning in your chest). This is worse when lying down or bending over. It may also cause belching and indigestion. Some of the things which make GERD worse are:  Increased weight pushes on stomach making acid rise more easily.   Smoking markedly increases acid production.   Alcohol decreases lower esophageal sphincter pressure (valve between stomach and esophagus), allowing acid from stomach into esophagus.   Late evening meals and going to bed with a full stomach increases pressure.   Anything that causes an increase in acid production.   Lower esophageal sphincter incompetence.  DIAGNOSIS  Hiatal hernia is often diagnosed with x-rays of your stomach and small bowel. This is called an UGI (upper gastrointestinal x-ray). Sometimes a gastroscopic procedure is done. This is a procedure where your caregiver uses a flexible instrument to look into the stomach and small bowel. HOME CARE INSTRUCTIONS   Try to achieve and maintain an ideal body weight.   Avoid drinking alcoholic beverages.   Stop smoking.  Put the head of your bed on 4 to 6 inch blocks. This will keep your head and esophagus higher than your stomach. If you cannot use blocks, sleep with several pillows under your head and shoulders.   Over-the-counter medications will decrease acid production. Your caregiver can also prescribe medications for this. Take as directed.   1/2 to 1  teaspoon of an antacid taken every hour while awake, with meals and at bedtime, will neutralize acid.   Do not take aspirin, ibuprofen (Advil or Motrin), or other nonsteroidal anti-inflammatory drugs.   Do not wear tight clothing around your chest or stomach.   Eat smaller meals and eat more frequently. This keeps your stomach from getting too full. Eat slowly.   Do not lie down for 2 or 3 hours after eating. Do not eat or drink anything 1 to 2 hours before going to bed.   Avoid caffeine beverages (colas, coffee, cocoa, tea), fatty foods, citrus fruits and all other foods and drinks that contain acid and that seem to increase the problems.   Avoid bending over, especially after eating. Also avoid straining during bowel movements or when urinating or lifting things. Anything that increases the pressure in your belly increases the amount of acid that may be pushed up into your esophagus.  SEEK IMMEDIATE MEDICAL CARE IF:  There is change in location (pain in arms, neck, jaw, teeth or back) of your pain, or the pain is getting worse.   You also experience nausea, vomiting, sweating (diaphoresis), or shortness of breath.   You develop continual vomiting, vomit blood or coffee ground material, have bright red blood in your stools, or have black tarry stools.  Some of these symptoms could signal other problems such as heart disease. MAKE SURE YOU:   Understand these instructions.   Monitor your condition.   Contact your caregiver if you are not doing well or are getting worse.  Document Released: 11/29/2003 Document Revised: 08/28/2011 Document Reviewed: 09/08/2005 [facility] Patient Information 2012 [place], [place].

## 2012-02-12 NOTE — H&P (View-Only) (Signed)
Quick Note:  No anemia. HFP with very, very mildly elevated AP.  Can repeat this in 4-6 weeks (or at f/u OV), make sure fasting. Likely not pertinent to situation currently. ______ 

## 2012-02-12 NOTE — Telephone Encounter (Signed)
Patient is scheduled for Abdominal U/S 02/13/12 at 8:00 am to further eval abdominal pain per RMR

## 2012-02-13 ENCOUNTER — Ambulatory Visit (HOSPITAL_COMMUNITY): Payer: Medicare Other

## 2012-02-17 ENCOUNTER — Encounter: Payer: Self-pay | Admitting: Internal Medicine

## 2012-02-17 ENCOUNTER — Telehealth: Payer: Self-pay | Admitting: Internal Medicine

## 2012-02-17 NOTE — Telephone Encounter (Signed)
Pt called this afternoon checking to see if her results were back. Please call her at [phone]

## 2012-02-17 NOTE — Telephone Encounter (Signed)
Spoke with pt- advised of RMR letter that was mailed today and informed her of what it said.

## 2012-02-18 ENCOUNTER — Ambulatory Visit (HOSPITAL_COMMUNITY): Payer: Medicare Other

## 2012-02-19 ENCOUNTER — Ambulatory Visit (HOSPITAL_COMMUNITY)
Admission: RE | Admit: 2012-02-19 | Discharge: 2012-02-19 | Disposition: A | Discharge status: Home / Self Care | Payer: Medicare Other | Source: Ambulatory Visit | Attending: Internal Medicine | Admitting: Internal Medicine

## 2012-02-19 DIAGNOSIS — R109 Unspecified abdominal pain: Secondary | ICD-10-CM | POA: Diagnosis not present

## 2012-02-19 DIAGNOSIS — R112 Nausea with vomiting, unspecified: Secondary | ICD-10-CM | POA: Diagnosis not present

## 2012-02-22 ENCOUNTER — Encounter: Payer: Self-pay | Admitting: Internal Medicine

## 2012-02-23 ENCOUNTER — Telehealth: Payer: Self-pay | Admitting: Internal Medicine

## 2012-02-23 NOTE — Telephone Encounter (Signed)
Pt was calling for her results and also stated she was still having tar black stools and was waiting to be scheduled for a colonoscopy.

## 2012-02-24 NOTE — Telephone Encounter (Signed)
LMOM for patient to call me back regarding making OV to set up colonoscopy

## 2012-02-25 NOTE — Telephone Encounter (Signed)
Pt is aware of OV on 6/17 @10  with LSL

## 2012-03-01 ENCOUNTER — Encounter: Payer: Self-pay | Admitting: Internal Medicine

## 2012-03-02 ENCOUNTER — Inpatient Hospital Stay (HOSPITAL_COMMUNITY)
Admission: AD | Admit: 2012-03-02 | Discharge: 2012-03-08 | DRG: 897 | Disposition: A | Discharge status: Home / Self Care | Payer: Medicare Other | Source: Ambulatory Visit | Attending: Psychiatry | Admitting: Psychiatry

## 2012-03-02 ENCOUNTER — Encounter (HOSPITAL_COMMUNITY): Payer: Self-pay | Admitting: *Deleted

## 2012-03-02 ENCOUNTER — Emergency Department (HOSPITAL_COMMUNITY)
Admission: EM | Admit: 2012-03-02 | Discharge: 2012-03-02 | Disposition: A | Discharge status: Other Acute Inpatient Hospital | Payer: Medicare Other | Attending: Emergency Medicine | Admitting: Emergency Medicine

## 2012-03-02 DIAGNOSIS — Z794 Long term (current) use of insulin: Secondary | ICD-10-CM | POA: Diagnosis not present

## 2012-03-02 DIAGNOSIS — J4489 Other specified chronic obstructive pulmonary disease: Secondary | ICD-10-CM | POA: Insufficient documentation

## 2012-03-02 DIAGNOSIS — R079 Chest pain, unspecified: Secondary | ICD-10-CM

## 2012-03-02 DIAGNOSIS — E119 Type 2 diabetes mellitus without complications: Secondary | ICD-10-CM | POA: Diagnosis not present

## 2012-03-02 DIAGNOSIS — F17200 Nicotine dependence, unspecified, uncomplicated: Secondary | ICD-10-CM | POA: Diagnosis present

## 2012-03-02 DIAGNOSIS — F411 Generalized anxiety disorder: Secondary | ICD-10-CM | POA: Diagnosis not present

## 2012-03-02 DIAGNOSIS — J449 Chronic obstructive pulmonary disease, unspecified: Secondary | ICD-10-CM | POA: Diagnosis not present

## 2012-03-02 DIAGNOSIS — E039 Hypothyroidism, unspecified: Secondary | ICD-10-CM | POA: Diagnosis not present

## 2012-03-02 DIAGNOSIS — R45851 Suicidal ideations: Secondary | ICD-10-CM

## 2012-03-02 DIAGNOSIS — R739 Hyperglycemia, unspecified: Secondary | ICD-10-CM

## 2012-03-02 DIAGNOSIS — K449 Diaphragmatic hernia without obstruction or gangrene: Secondary | ICD-10-CM | POA: Insufficient documentation

## 2012-03-02 DIAGNOSIS — Z79899 Other long term (current) drug therapy: Secondary | ICD-10-CM

## 2012-03-02 DIAGNOSIS — R1013 Epigastric pain: Secondary | ICD-10-CM

## 2012-03-02 DIAGNOSIS — Z8711 Personal history of peptic ulcer disease: Secondary | ICD-10-CM | POA: Insufficient documentation

## 2012-03-02 DIAGNOSIS — F1420 Cocaine dependence, uncomplicated: Secondary | ICD-10-CM | POA: Diagnosis present

## 2012-03-02 DIAGNOSIS — K219 Gastro-esophageal reflux disease without esophagitis: Secondary | ICD-10-CM | POA: Diagnosis present

## 2012-03-02 DIAGNOSIS — K5900 Constipation, unspecified: Secondary | ICD-10-CM | POA: Diagnosis not present

## 2012-03-02 DIAGNOSIS — Z634 Disappearance and death of family member: Secondary | ICD-10-CM

## 2012-03-02 DIAGNOSIS — F1994 Other psychoactive substance use, unspecified with psychoactive substance-induced mood disorder: Principal | ICD-10-CM | POA: Diagnosis present

## 2012-03-02 HISTORY — DX: Diaphragmatic hernia without obstruction or gangrene: K44.9

## 2012-03-02 LAB — BASIC METABOLIC PANEL
Calcium: 9.8 mg/dL (ref 8.4–10.5)
Chloride: 103 mEq/L (ref 96–112)
Creatinine, Ser: 0.81 mg/dL (ref 0.50–1.10)
GFR calc Af Amer: >90 mL/min (ref >90)
GFR calc non Af Amer: 82 mL/min — ABNORMAL LOW (ref >90)

## 2012-03-02 LAB — DIFFERENTIAL
Basophils Absolute: 0 10*3/uL (ref 0.0–0.1)
Basophils Relative: 1 % (ref 0–1)
Eosinophils Absolute: 0.2 10*3/uL (ref 0.0–0.7)
Eosinophils Relative: 3 % (ref 0–5)
Lymphocytes Relative: 30 % (ref 12–46)
Lymphs Abs: 2 10*3/uL (ref 0.7–4.0)
Monocytes Absolute: 0.4 10*3/uL (ref 0.1–1.0)
Monocytes Relative: 7 % (ref 3–12)
Neutro Abs: 3.9 10*3/uL (ref 1.7–7.7)
Neutrophils Relative %: 60 % (ref 43–77)

## 2012-03-02 LAB — CBC
HCT: 40.1 % (ref 36.0–46.0)
MCH: 31.2 pg (ref 26.0–34.0)
MCHC: 34.4 g/dL (ref 30.0–36.0)
RDW: 13.3 % (ref 11.5–15.5)

## 2012-03-02 LAB — RAPID URINE DRUG SCREEN, HOSP PERFORMED
Barbiturates: NOT DETECTED
Benzodiazepines: NOT DETECTED

## 2012-03-02 LAB — ETHANOL: Alcohol, Ethyl (B): <11 mg/dL (ref 0–11)

## 2012-03-02 LAB — GLUCOSE, CAPILLARY: Glucose-Capillary: 325 mg/dL — ABNORMAL HIGH (ref 70–99)

## 2012-03-02 MED ORDER — ALUM & MAG HYDROXIDE-SIMETH 200-200-20 MG/5ML PO SUSP
30.0000 mL | ORAL | Status: DC | PRN
  Administered 2012-03-03 (×2): 30 mL via ORAL

## 2012-03-02 MED ORDER — MAGNESIUM HYDROXIDE 400 MG/5ML PO SUSP
30.0000 mL | Freq: Every day | ORAL | Status: DC | PRN
  Administered 2012-03-04 – 2012-03-07 (×2): 30 mL via ORAL

## 2012-03-02 MED ORDER — INSULIN ASPART 100 UNIT/ML ~~LOC~~ SOLN
10.0000 [IU] | Freq: Once | SUBCUTANEOUS | Status: AC
  Administered 2012-03-02: 10 [IU] via SUBCUTANEOUS

## 2012-03-02 MED ORDER — TRAZODONE HCL 100 MG PO TABS
100.0000 mg | ORAL_TABLET | Freq: Every evening | ORAL | Status: DC | PRN
  Administered 2012-03-03: 100 mg via ORAL
  Filled 2012-03-02: qty 14
  Filled 2012-03-02: qty 1

## 2012-03-02 MED ORDER — SODIUM CHLORIDE 0.9 % IV SOLN: INTRAVENOUS | Status: DC

## 2012-03-02 MED ORDER — ACETAMINOPHEN 500 MG PO TABS
1000.0000 mg | ORAL_TABLET | Freq: Once | ORAL | Status: AC
  Administered 2012-03-02: 1000 mg via ORAL

## 2012-03-02 MED ORDER — ACETAMINOPHEN 325 MG PO TABS
650.0000 mg | ORAL_TABLET | Freq: Four times a day (QID) | ORAL | Status: DC | PRN
Start: 2012-03-02 — End: 2012-03-08
  Administered 2012-03-03 – 2012-03-08 (×9): 650 mg via ORAL
  Filled 2012-03-02: qty 2

## 2012-03-02 MED ORDER — ACETAMINOPHEN 325 MG PO TABS
ORAL_TABLET | ORAL | Status: AC
  Filled 2012-03-02: qty 2

## 2012-03-02 MED ORDER — INSULIN ASPART 100 UNIT/ML ~~LOC~~ SOLN
SUBCUTANEOUS | Status: AC
  Administered 2012-03-02: 10 [IU] via SUBCUTANEOUS
  Filled 2012-03-02: qty 1

## 2012-03-02 NOTE — Progress Notes (Addendum)
PT ACCEPTED AT [facility] BY DR RANDY READLING TO DR Orson Aloe- ROOM 503-2.  SEE SUPPORT PAPERWORK.  DR Merceda Elks AGREES WITH DISPOSITION.

## 2012-03-02 NOTE — ED Notes (Signed)
Ate 100% of supper tray.  In no distress; calm, cooperative.

## 2012-03-02 NOTE — ED Notes (Signed)
[place] [facility] de[facility] called for transport, pt belongings returned to pt with no discrepancies.

## 2012-03-02 NOTE — ED Notes (Signed)
Pt report already called by previous shift nurse, Dwana Curd

## 2012-03-02 NOTE — ED Notes (Signed)
Alert, sitting up on stretcher eating meal tray; states, "can you ask the doctor if I can have something for pain and nausea?  I'm so sick and I'm hurting all over.".  Will notify EDP.

## 2012-03-02 NOTE — ED Notes (Signed)
Pt reports recent loss (over the weekend) of her best friend.  Reports her father is in poor health, and states d/t the fact that she and her mother do not get along, she is not able to visit with her father.  States she lives with her brother, and that he is a good support for her.  C/o multiple medical problems- states, "I just feel bad.  Everything hurts".  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  Reports that she would overdose with pills had she been released from [facility] today. Pt is calm, cooperative.

## 2012-03-02 NOTE — ED Notes (Signed)
Pt ate 100% of lunch tray.  Encouraged to provide urine specimen for testing.

## 2012-03-02 NOTE — ED Provider Notes (Signed)
History  This chart was scribed for Loren Racer, MD by Bennett Scrape. This patient was seen in room APA16A/APA16A and the patient's care was started at 1:28PM.  CSN: [id number]  Arrival date & time 03/02/12  1239   First MD Initiated Contact with Patient 03/02/12 1328      Chief Complaint  Patient presents with  . V70.1    The history is provided by the patient. No language interpreter was used.    [name] is a 51 y.o. female with a h/o bipolar disorder and depression brought in by [facility] who presents to the Emergency Department for IVC. Pt states that she has been more depressed and suicidal, because she lost her best friend one week ago. She also states that she has been off of her medications the last few days. She reports that she has previous admissions for SI and previous suicide attempts by overdose. She denies hallucinations and HI. She also c/o chronic neck pain that is worse with turning her head. She did not take any medications to improve the pain. She denies any other symptoms such as fever, cough and nausea. She has a h/o anxiety, DM, COPD, GERD, and hypothyroid. She is a current everyday smoker and alcohol user.  Her PCP is Dr. Felecia Shelling  Past Medical History  Diagnosis Date  . Hypothyroid   . Anxiety   . Diabetes mellitus     insulin  . COPD (chronic obstructive pulmonary disease)   . GERD (gastroesophageal reflux disease)   . PUD (peptic ulcer disease)     ? remote past, no reports from [facility] or [facility], states possibly Dr. Karilyn Cota.   . Hiatal hernia Diagnosed 2 weeks ago    Past Surgical History  Procedure Date  . Abdominal hysterectomy   . Tonsillectomy   . Appendectomy   . Bladder repair   . Esophagogastroduodenoscopy     in remote past, unclear who performed. No op notes from [facility] or [facility] (pt thought Dr. Karilyn Cota)  . Esophagogastroduodenoscopy 02/12/2012     Normal esophagus status post dilation as described above. Small hiatal hernia. Abnormal  gastric because of uncertain significance-status post biopsy    Family History  Problem Relation Age of Onset  . Liver cancer Mother     remission for 2 years  . Colon cancer Neg Hx     History  Substance Use Topics  . Smoking status: Current Everyday Smoker -- 0.5 packs/day for 15 years    Types: Cigarettes  . Smokeless tobacco: Not on file  . Alcohol Use: No     Review of Systems  Constitutional: Negative for fatigue.  HENT: Positive for neck pain (chronic). Negative for congestion, sinus pressure and ear discharge.   Eyes: Negative for discharge.  Respiratory: Negative for cough.   Cardiovascular: Negative for chest pain.  Gastrointestinal: Negative for abdominal pain and diarrhea.  Genitourinary: Negative for frequency and hematuria.  Musculoskeletal: Negative for back pain.  Skin: Negative for rash.  Neurological: Negative for seizures and headaches.  Hematological: Negative.   Psychiatric/Behavioral: Positive for suicidal ideas. Negative for hallucinations.    Allergies  Phenytoin; Sulfonamide derivatives; and Tetracyclines & related  Home Medications   Current Outpatient Rx  Name Route Sig Dispense Refill  . ALBUTEROL SULFATE HFA 108 (90 BASE) MCG/ACT IN AERS Inhalation Inhale 2 puffs into the lungs every 6 (six) hours as needed. For shortness of breath    . ALPRAZOLAM 1 MG PO TABS Oral Take 1 mg by mouth  4 (four) times daily as needed. SLEEP AND ANXIETY    . ESCITALOPRAM OXALATE 10 MG PO TABS Oral Take 10 mg by mouth every morning.     Marland Kitchen ESOMEPRAZOLE MAGNESIUM 40 MG PO CPDR Oral Take 1 capsule (40 mg total) by mouth 2 (two) times daily before a meal. 60 capsule 5  . INSULIN ASPART 100 UNIT/ML [place] SOLN Subcutaneous Inject 10-15 Units into the skin 3 (three) times daily before meals. SSI    . INSULIN GLARGINE 100 UNIT/ML [place] SOLN Subcutaneous Inject 14 Units into the skin 2 (two) times daily. 14 units in am and  14 units in pm    . LEVOTHYROXINE SODIUM 137 MCG PO  TABS Oral Take 137 mcg by mouth every morning.     Marland Kitchen METOCLOPRAMIDE HCL 10 MG PO TABS Oral Take 10 mg by mouth 4 (four) times daily.    Marland Kitchen NITROFURANTOIN MACROCRYSTAL 100 MG PO CAPS Oral Take 100 mg by mouth at bedtime.      Marland Kitchen PROMETHAZINE HCL 25 MG PO TABS Oral Take 25-50 mg by mouth every 6 (six) hours as needed. FOR NAUSEA     . TRAZODONE HCL 100 MG PO TABS Oral Take 100 mg by mouth at bedtime.      Marland Kitchen PROMETHAZINE HCL 25 MG PO TABS Oral Take 25 mg by mouth every 6 (six) hours as needed. For nausea      Triage Vitals: BP 127/86  Pulse 82  Temp(Src) 97.9 F (36.6 C) (Oral)  Resp 17  Ht 5\' 5"  (1.651 m)  Wt 157 lb (71.215 kg)  BMI 26.13 kg/m2  SpO2 100%  Physical Exam  Constitutional: She is oriented to person, place, and time. She appears well-developed.  HENT:  Head: Normocephalic and atraumatic.  Eyes: Conjunctivae and EOM are normal. No scleral icterus.  Neck: Neck supple. No thyromegaly present.       Posterior neck tenderness  Cardiovascular: Normal rate and regular rhythm.  Exam reveals no gallop and no friction rub.   No murmur heard. Pulmonary/Chest: Effort normal and breath sounds normal. No stridor. She has no wheezes. She has no rales. She exhibits no tenderness.  Abdominal: Soft. She exhibits no distension. There is no tenderness. There is no rebound.  Musculoskeletal: Normal range of motion. She exhibits no edema.  Lymphadenopathy:    She has no cervical adenopathy.  Neurological: She is alert and oriented to person, place, and time. Coordination normal.       5/5 strenght, sensation is intact  Skin: Skin is warm and dry. No rash noted. No erythema.  Psychiatric: She has a normal mood and affect. Her behavior is normal.    ED Course  Procedures (including critical care time)  DIAGNOSTIC STUDIES: Oxygen Saturation is 100% on room air, normal by my interpretation.    COORDINATION OF CARE: 1:42PM-Advised pt to follow up with her PCP for the neck pain. Discussed  treatment plan which includes urinalysis and blood tests with pt and pt agreed to plan.  Labs Reviewed  BASIC METABOLIC PANEL - Abnormal; Notable for the following:    Glucose, Bld 181 (*)    GFR calc non Af Amer 82 (*)    All other components within normal limits  URINE RAPID DRUG SCREEN (HOSP PERFORMED) - Abnormal; Notable for the following:    Cocaine POSITIVE (*)    All other components within normal limits  GLUCOSE, CAPILLARY - Abnormal; Notable for the following:    Glucose-Capillary 325 (*)  All other components within normal limits  CBC  DIFFERENTIAL  ETHANOL   No results found.   1. Suicidal ideation   2. Hyperglycemia       MDM  I personally performed the services described in this documentation, which was scribed in my presence. The recorded information has been reviewed and considered.     Loren Racer, MD 03/02/12 [phone]

## 2012-03-02 NOTE — ED Notes (Signed)
Pt here with deputy with IVC papers.  Alert,NAD at present.suicidal thoughts.

## 2012-03-02 NOTE — BH Assessment (Signed)
Assessment Note   [name] is an 51 y.o. female. The patient was a [name] today when she told staff that she was suicidal, and would overdose if she went home. [facility] staff completed IVC paper work and patient was transferred to the ED.  The patient talks about losing her best friend over the week end. She says he had a heart attack and she did not get to say good bye. Her father is now in Hospice, and this is adding to her problems. Additionally she does not get along with her mother and feels that she is alone. She is not psychotic. She is  Not homicidal . She has had 3 previous admissions and 3 previous attempts on her life. She has remained clean for 6 months, she is not using cocaine. Patient referred to [facility].  Axis I:  Major Depressive Disorder recurrent sever;Bipolar DO;OCD;Cocaine ABuse in remission Axis II: Deferred Axis III:  Past Medical History  Diagnosis Date  . Hypothyroid   . Anxiety   . Diabetes mellitus     insulin  . COPD (chronic obstructive pulmonary disease)   . GERD (gastroesophageal reflux disease)   . PUD (peptic ulcer disease)     ? remote past, no reports from [facility] or [facility], states possibly Dr. Karilyn Cota.   . Hiatal hernia    Axis IV: housing problems, other psychosocial or environmental problems, problems related to social environment, problems with access to health care services and problems with primary support group Axis V: 11-20 some danger of hurting self or others possible OR occasionally fails to maintain minimal personal hygiene OR gross impairment in communication  Past Medical History:  Past Medical History  Diagnosis Date  . Hypothyroid   . Anxiety   . Diabetes mellitus     insulin  . COPD (chronic obstructive pulmonary disease)   . GERD (gastroesophageal reflux disease)   . PUD (peptic ulcer disease)     ? remote past, no reports from [facility] or [facility], states possibly Dr. Karilyn Cota.   . Hiatal hernia     Past Surgical History    Procedure Date  . Abdominal hysterectomy   . Tonsillectomy   . Appendectomy   . Bladder repair   . Esophagogastroduodenoscopy     in remote past, unclear who performed. No op notes from [facility] or [facility] (pt thought Dr. Karilyn Cota)  . Esophagogastroduodenoscopy 02/12/2012     Normal esophagus status post dilation as described above. Small hiatal hernia. Abnormal gastric because of uncertain significance-status post biopsy    Family History:  Family History  Problem Relation Age of Onset  . Liver cancer Mother     remission for 2 years  . Colon cancer Neg Hx     Social History:  reports that she has been smoking Cigarettes.  She has a 7.5 pack-year smoking history. She does not have any smokeless tobacco history on file. She reports that she uses illicit drugs (Cocaine). She reports that she does not drink alcohol.  Additional Social History:     CIWA: CIWA-Ar BP: 127/86 mmHg Pulse Rate: 82  COWS:    Allergies:  Allergies  Allergen Reactions  . Phenytoin Anaphylaxis    Reaction: SJS (STEVENS JOHNSON SYNDROME)  . Sulfonamide Derivatives Nausea And Vomiting  . Tetracyclines & Related Nausea And Vomiting    Home Medications:  (Not in a hospital admission)  OB/GYN Status:  No LMP recorded. Patient has had a hysterectomy.  General Assessment Data Location of Assessment:  [facility] ACT Assessment: Yes Living Arrangements: Parent Can pt return to current living arrangement?: Yes Admission Status: Involuntary Is patient capable of signing voluntary admission?: No Transfer from: Acute Hospital Referral Source: MD  Education Status Is patient currently in school?: No  Risk to self Suicidal Ideation: Yes-Currently Present Suicidal Intent: Yes-Currently Present Is patient at risk for suicide?: Yes Suicidal Plan?: Yes-Currently Present Specify Current Suicidal Plan: patient states she will overdose on her medications if she returns homoe Access to Means: Yes Specify Access to  Suicidal Means: prescription meds available at home What has been your use of drugs/alcohol within the last 12 months?: clean for 6 months Previous Attempts/Gestures: Yes How many times?: 3  Other Self Harm Risks: none Triggers for Past Attempts: Unknown Intentional Self Injurious Behavior: None Family Suicide History: No Recent stressful life event(s): Recent negative physical changes;Loss (Comment) (bestfriend died last week end, father in Hospice) Persecutory voices/beliefs?: No Depression: Yes Depression Symptoms: Despondent;Insomnia;Tearfulness;Guilt;Loss of interest in usual pleasures;Feeling worthless/self pity Substance abuse history and/or treatment for substance abuse?: Yes Suicide prevention information given to non-admitted patients: Not applicable  Risk to Others Homicidal Ideation: No Thoughts of Harm to Others: No Current Homicidal Intent: No Current Homicidal Plan: No Access to Homicidal Means: No History of harm to others?: No Assessment of Violence: None Noted Does patient have access to weapons?: No Criminal Charges Pending?: No Does patient have a court date: No  Psychosis Hallucinations: None noted Delusions: None noted  Mental Status Report Appear/Hygiene: Improved Eye Contact: Fair Motor Activity: Restlessness;Freedom of movement Speech: Rapid;Logical/coherent Level of Consciousness: Alert;Restless Mood: Depressed Affect: Depressed Anxiety Level: None Thought Processes: Coherent;Relevant Judgement: Unimpaired Orientation: Person;Place;Time;Situation Obsessive Compulsive Thoughts/Behaviors: Minimal  Cognitive Functioning Concentration: Normal Memory: Recent Intact;Remote Intact IQ: Average Insight: Fair Impulse Control: Poor Appetite: Fair Sleep: Decreased Total Hours of Sleep: 3  Vegetative Symptoms: None  ADLScreening [facility]) Patient's cognitive ability adequate to safely complete daily activities?: Yes Patient able  to express need for assistance with ADLs?: Yes Independently performs ADLs?: Yes  Abuse/Neglect [facility]) Physical Abuse: Denies Verbal Abuse: Denies Sexual Abuse: Denies  Prior Inpatient Therapy Prior Inpatient Therapy: Yes Prior Therapy Dates: 2010 Prior Therapy [facility]ilty[facility]Provider(s): [facility] Reason for Treatment: depression;SI  Prior Outpatient Therapy Prior Outpatient Therapy: Yes Prior Therapy Dates: current Prior Therapy Facilty/Provider(s): [facility] Reason for Treatment: depression  ADL Screening (condition at time of admission) Patient's cognitive ability adequate to safely complete daily activities?: Yes Patient able to express need for assistance with ADLs?: Yes Independently performs ADLs?: Yes       Abuse/Neglect Assessment (Assessment to be complete while patient is alone) Physical Abuse: Denies Verbal Abuse: Denies Sexual Abuse: Denies Values / Beliefs Cultural Requests During Hospitalization: None Spiritual Requests During Hospitalization: None        Additional Information 1:1 In Past 12 Months?: No CIRT Risk: No Elopement Risk: No Does patient have medical clearance?: Yes     Disposition:  Disposition Disposition of Patient: Inpatient treatment program Type of inpatient treatment program: Adult  On Site Evaluation by:   Reviewed with Physician:     Jearld Pies 03/02/2012 3:03 PM

## 2012-03-03 DIAGNOSIS — F1420 Cocaine dependence, uncomplicated: Secondary | ICD-10-CM | POA: Diagnosis present

## 2012-03-03 DIAGNOSIS — F319 Bipolar disorder, unspecified: Secondary | ICD-10-CM | POA: Diagnosis not present

## 2012-03-03 DIAGNOSIS — Z634 Disappearance and death of family member: Secondary | ICD-10-CM

## 2012-03-03 LAB — GLUCOSE, CAPILLARY
Glucose-Capillary: 252 mg/dL — ABNORMAL HIGH (ref 70–99)
Glucose-Capillary: 151 mg/dL — ABNORMAL HIGH (ref 70–99)
Glucose-Capillary: 343 mg/dL — ABNORMAL HIGH (ref 70–99)

## 2012-03-03 MED ORDER — INSULIN ASPART 100 UNIT/ML ~~LOC~~ SOLN
0.0000 [IU] | Freq: Three times a day (TID) | SUBCUTANEOUS | Status: DC
  Administered 2012-03-03: 11 [IU] via SUBCUTANEOUS
  Administered 2012-03-03: 3 [IU] via SUBCUTANEOUS
  Administered 2012-03-04: 11 [IU] via SUBCUTANEOUS
  Administered 2012-03-04: 2 [IU] via SUBCUTANEOUS
  Administered 2012-03-04: 11 [IU] via SUBCUTANEOUS
  Administered 2012-03-05: 3 [IU] via SUBCUTANEOUS
  Administered 2012-03-05: 11 [IU] via SUBCUTANEOUS
  Administered 2012-03-05: 2 [IU] via SUBCUTANEOUS
  Administered 2012-03-06: 15 [IU] via SUBCUTANEOUS
  Administered 2012-03-07: 5 [IU] via SUBCUTANEOUS
  Administered 2012-03-07 – 2012-03-08 (×2): 2 [IU] via SUBCUTANEOUS

## 2012-03-03 MED ORDER — PANTOPRAZOLE SODIUM 40 MG PO TBEC
80.0000 mg | DELAYED_RELEASE_TABLET | Freq: Two times a day (BID) | ORAL | Status: DC
  Administered 2012-03-03 – 2012-03-08 (×11): 80 mg via ORAL
  Filled 2012-03-03 (×14): qty 2

## 2012-03-03 MED ORDER — ESCITALOPRAM OXALATE 10 MG PO TABS
10.0000 mg | ORAL_TABLET | Freq: Every day | ORAL | Status: DC
  Administered 2012-03-03 – 2012-03-08 (×6): 10 mg via ORAL
  Filled 2012-03-03 (×7): qty 1

## 2012-03-03 MED ORDER — INSULIN GLARGINE 100 UNIT/ML ~~LOC~~ SOLN
10.0000 [IU] | Freq: Once | SUBCUTANEOUS | Status: AC
  Administered 2012-03-03: 10 [IU] via SUBCUTANEOUS

## 2012-03-03 MED ORDER — TRAZODONE HCL 100 MG PO TABS
100.0000 mg | ORAL_TABLET | Freq: Every day | ORAL | Status: DC
  Administered 2012-03-03 – 2012-03-04 (×2): 100 mg via ORAL
  Filled 2012-03-03 (×3): qty 1

## 2012-03-03 MED ORDER — INSULIN ASPART 100 UNIT/ML ~~LOC~~ SOLN
4.0000 [IU] | Freq: Three times a day (TID) | SUBCUTANEOUS | Status: DC
  Administered 2012-03-03 – 2012-03-08 (×15): 4 [IU] via SUBCUTANEOUS

## 2012-03-03 MED ORDER — NICOTINE 21 MG/24HR TD PT24
21.0000 mg | MEDICATED_PATCH | Freq: Every day | TRANSDERMAL | Status: DC
  Administered 2012-03-03 – 2012-03-08 (×6): 21 mg via TRANSDERMAL
  Filled 2012-03-03 (×9): qty 1

## 2012-03-03 MED ORDER — INSULIN ASPART 100 UNIT/ML ~~LOC~~ SOLN
0.0000 [IU] | Freq: Once | SUBCUTANEOUS | Status: AC
  Administered 2012-03-03: 15 [IU] via SUBCUTANEOUS

## 2012-03-03 MED ORDER — INSULIN ASPART 100 UNIT/ML ~~LOC~~ SOLN: 10.0000 [IU] | Freq: Once | SUBCUTANEOUS | Status: DC

## 2012-03-03 MED ORDER — NITROFURANTOIN MONOHYD MACRO 100 MG PO CAPS
100.0000 mg | ORAL_CAPSULE | Freq: Every day | ORAL | Status: DC
  Administered 2012-03-03 – 2012-03-07 (×5): 100 mg via ORAL
  Filled 2012-03-03 (×6): qty 1

## 2012-03-03 MED ORDER — GABAPENTIN 300 MG PO CAPS
300.0000 mg | ORAL_CAPSULE | Freq: Four times a day (QID) | ORAL | Status: DC
  Administered 2012-03-03 – 2012-03-05 (×6): 300 mg via ORAL
  Filled 2012-03-03 (×12): qty 1

## 2012-03-03 MED ORDER — INSULIN GLARGINE 100 UNIT/ML ~~LOC~~ SOLN
14.0000 [IU] | Freq: Two times a day (BID) | SUBCUTANEOUS | Status: DC
  Administered 2012-03-03 – 2012-03-08 (×10): 14 [IU] via SUBCUTANEOUS

## 2012-03-03 MED ORDER — POTASSIUM CHLORIDE CRYS ER 20 MEQ PO TBCR
20.0000 meq | EXTENDED_RELEASE_TABLET | Freq: Two times a day (BID) | ORAL | Status: AC
  Administered 2012-03-03 – 2012-03-06 (×6): 20 meq via ORAL
  Filled 2012-03-03 (×7): qty 1

## 2012-03-03 MED ORDER — METOCLOPRAMIDE HCL 10 MG PO TABS
10.0000 mg | ORAL_TABLET | Freq: Four times a day (QID) | ORAL | Status: DC
  Administered 2012-03-03 – 2012-03-08 (×21): 10 mg via ORAL
  Filled 2012-03-03 (×24): qty 1

## 2012-03-03 MED ORDER — LEVOTHYROXINE SODIUM 137 MCG PO TABS
137.0000 ug | ORAL_TABLET | ORAL | Status: DC
  Administered 2012-03-03 – 2012-03-08 (×6): 137 ug via ORAL
  Filled 2012-03-03 (×7): qty 1

## 2012-03-03 MED ORDER — PANTOPRAZOLE SODIUM 40 MG PO TBEC: 40.0000 mg | DELAYED_RELEASE_TABLET | Freq: Every day | ORAL | Status: DC

## 2012-03-03 MED ORDER — ALBUTEROL SULFATE HFA 108 (90 BASE) MCG/ACT IN AERS: 2.0000 | INHALATION_SPRAY | Freq: Four times a day (QID) | RESPIRATORY_TRACT | Status: DC | PRN

## 2012-03-03 MED ORDER — NITROFURANTOIN MACROCRYSTAL 100 MG PO CAPS
100.0000 mg | ORAL_CAPSULE | Freq: Every day | ORAL | Status: DC
  Filled 2012-03-03 (×2): qty 1
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
## 2012-03-03 NOTE — H&P (Signed)
Medical/psychiatric screening examination/treatment/procedure(s) were performed by non-physician practitioner and as supervising physician I was immediately available for consultation/collaboration.  I have seen and examined this patient and agree the major elements of this evaluation.  

## 2012-03-03 NOTE — Tx Team (Signed)
Interdisciplinary Treatment Plan Update (Adult)  Date:  03/03/2012  Time Reviewed:  10:33 AM   Progress in Treatment: Attending groups: No, [name] is in bed, reports she is not feeling well Participating in groups:   Taking medication as prescribed:  Yes Tolerating medication: Yes Family/Significant othe contact made: Requesting consent to contact family Patient understands diagnosis: Yes Discussing patient identified problems/goals with staff:  Yes Medical problems stabilized or resolved: Yes Denies suicidal/homicidal ideation: Yes Issues/concerns per patient self-inventory:  No  Other:  New problem(s) identified: None  Reason for Continuation of Hospitalization: Anxiety Depression Medication stabilization  Interventions implemented related to continuation of hospitalization:  Medication stabilization, safety checks q 15 mins, group attendance  Additional comments:  Estimated length of stay: 3-5 days  Discharge Plan: [name] will discharge to [facility] for further treatment  New goal(s):  Review of initial/current patient goals per problem list:   1.  Goal(s): Reduce potential for suicide/self-harm  Met:  No  Target date: by discharge  As evidenced by: [name] is not feeling suicidal today, but states she will know more as the day goes on  2.  Goal (s): Decrease depressive symptom rating to 4 ore less  Met:  No   Target date: by discharge  As evidenced by: [name] rates depression at 10  3.  Goal(s): Address substance abuse problems  Met:  No  Target date: by discharge  As evidenced by: [name] will discuss substance abuse in groups and will be referred for further treatment  4.  Goal(s): Medication stabilization  Met:  No  Target date: by discharge  As evidenced by: [name] has not yet begun new medications  Attendees: Patient:     Family:     Physician:  Dr Orson Aloe, MD 03/03/2012 10:33 AM  Nursing:   Omelia Blackwater, RN 03/03/2012 10:33 AM    Case Manager:  Juline Patch, LCSW 03/03/2012 10:33 AM  Counselor:  Angus Palms, LCSW 03/03/2012 10:33 AM  Other:  Reyes Ivan, LCSWA 03/03/2012 10:33 AM  Other:  Chinita Greenland, RN 03/03/2012 10:33 AM  Other:     Other:      Scribe for Treatment Team:   Billie Lade, 03/03/2012 10:33 AM

## 2012-03-03 NOTE — Progress Notes (Addendum)
Patient seen during during d/c planning group and or treatment team. She reports admitting to hospital following a suicide attempt.  She currently endorses SI and contracts for safety.  She rates all symptoms at ten.  Patient advised she wants to discharge to [facility] for residential treatment.  She has been working with a Veterinary surgeon at [facility] in [place] to get into treatment.    Per State Regulation 482.30 This chart was reviewed for medical necessity with respect to the patient's  Admission/Duration of Stay  [facility], LCSW @6 /08/2012    Next Review Date:  03/06/2012

## 2012-03-03 NOTE — Progress Notes (Signed)
03/03/2012         Time: 1415      Group Topic/Focus: The focus of this group is on enhancing patients' problem solving skills, which involves identifying the problem, brainstorming solutions and choosing and trying a solution.    Participation Level: Active  Participation Quality: Appropriate and Attentive  Affect: Appropriate  Cognitive: Oriented   Additional Comments: None.   [name] 03/03/2012 3:35 PM 

## 2012-03-03 NOTE — Progress Notes (Signed)
[facility] Group Notes: (Counselor/Nursing/MHT/Case Management/Adjunct) 03/03/2012    11:00am Emotion Regulation  Type of Therapy:  Group Therapy  Participation Level:  None  Participation Quality: Attentive    Affect:  None  Cognitive:  Appropriate  Insight:  None  Engagement in Group: None  Engagement in Therapy:  None  Modes of Intervention:  Support and Exploration  Summary of Progress/Problems:  Arline Asp was attentive but not engaged in group process    Billie Lade 03/03/2012  3:51 PM     [facility] Group Notes: (Counselor/Nursing/MHT/Case Management/Adjunct) 03/03/2012    1:15pm Unmet Needs  Type of Therapy:  Group Therapy  Participation Level:  None  Participation Quality: Attentive    Affect:  None  Cognitive:  Appropriate  Insight:  None  Engagement in Group: None  Engagement in Therapy:  None  Modes of Intervention:  Support and Exploration  Summary of Progress/Problems:  Arline Asp  was attentive but did not share in group process    Billie Lade 03/03/2012  3:54pm

## 2012-03-03 NOTE — Tx Team (Signed)
Initial Interdisciplinary Treatment Plan  PATIENT STRENGTHS: (choose at least two) Ability for insight Active sense of humor Average or above average intelligence Capable of independent living Communication skills Financial means General fund of knowledge Motivation for treatment/growth Physical Health Religious Affiliation Special hobby/interest Supportive family/friends  PATIENT STRESSORS: Financial difficulties Health problems Legal issue Loss of best friend* Marital or family conflict Substance abuse Traumatic event   PROBLEM LIST: Problem List/Patient Goals Date to be addressed Date deferred Reason deferred Estimated date of resolution  'Drug free" 03/03/12     "Want to get out into the world more and enjoy life" 03/03/12           Depression 03/03/12     Increased risk for suicide 03/03/12     Substance abuse 03/03/12                        DISCHARGE CRITERIA:  Ability to meet basic life and health needs Adequate post-discharge living arrangements Improved stabilization in mood, thinking, and/or behavior Medical problems require only outpatient monitoring Motivation to continue treatment in a less acute level of care Need for constant or close observation no longer present Reduction of life-threatening or endangering symptoms to within safe limits Safe-care adequate arrangements made Verbal commitment to aftercare and medication compliance Withdrawal symptoms are absent or subacute and managed without 24-hour nursing intervention  PRELIMINARY DISCHARGE PLAN: Attend aftercare/continuing care group Attend 12-step recovery group Outpatient therapy Participate in family therapy Return to previous living arrangement  PATIENT/FAMIILY INVOLVEMENT: This treatment plan has been presented to and reviewed with the patient, [name], and/or family member.  The patient and family have been given the opportunity to ask questions and make  suggestions.  [name] 03/03/2012, 12:12 AM

## 2012-03-03 NOTE — Progress Notes (Addendum)
[facility] MD Progress Note  03/03/2012 6:08 PM  Diagnosis:   Axis I: Bereavement, Substance Induced Mood Disorder and Cocaone Dependence Axis III:  Past Medical History  Diagnosis Date  . Hypothyroid   . Anxiety   . Diabetes mellitus     insulin  . COPD (chronic obstructive pulmonary disease)   . GERD (gastroesophageal reflux disease)   . PUD (peptic ulcer disease)     ? remote past, no reports from [place] or [facility], states possibly Dr. Karilyn Cota.   . Hiatal hernia     ADL's:  Intact  Sleep: Fair  Appetite:  Fair  Suicidal Ideation:  Pt denies suicidal ideation Homicidal Ideation:  Denies adamantly any homicidal thoughts.  Mental Status Examination/Evaluation: Objective:  Appearance: Casual  Eye Contact::  Good  Speech:  Clear and Coherent  Volume:  Normal  Mood:  Depressed  Affect:  Blunt  Thought Process:  Coherent  Orientation:  Full  Thought Content:  WDL  Suicidal Thoughts:  No  Homicidal Thoughts:  No  Memory:  Immediate;   Fair  Judgement:  Impaired  Insight:  Lacking  Psychomotor Activity:  Normal  Concentration:  Fair  Recall:  Fair  Akathisia:  No  AIMS (if indicated):     Assets:  Communication Skills Desire for Improvement  Sleep:  Number of Hours: 4.5    Vital Signs:Blood pressure 107/72, pulse 71, temperature 97.8 F (36.6 C), temperature source Oral, resp. rate 16, height 5\' 5"  (1.651 m), weight 75.751 kg (167 lb). Current Medications: Current Facility-Administered Medications  Medication Dose Route Frequency Provider Last Rate Last Dose  . acetaminophen (TYLENOL) tablet 650 mg  650 mg Oral Q6H PRN Curlene Labrum Readling, MD   650 mg at 03/03/12 1305  . albuterol (PROVENTIL HFA;VENTOLIN HFA) 108 (90 BASE) MCG/ACT inhaler 2 puff  2 puff Inhalation Q6H PRN Sanjuana Kava, NP      . alum & mag hydroxide-simeth (MAALOX/MYLANTA) 200-200-20 MG/5ML suspension 30 mL  30 mL Oral Q4H PRN Curlene Labrum Readling, MD   30 mL at 03/03/12 1306  . escitalopram (LEXAPRO) tablet  10 mg  10 mg Oral Daily Sanjuana Kava, NP   10 mg at 03/03/12 1205  . insulin aspart (novoLOG) injection 0-15 Units  0-15 Units Subcutaneous Once Wonda Cerise, MD   15 Units at 03/03/12 0703  . insulin aspart (novoLOG) injection 0-15 Units  0-15 Units Subcutaneous TID WC Sanjuana Kava, NP   11 Units at 03/03/12 1704  . insulin aspart (novoLOG) injection 4 Units  4 Units Subcutaneous TID WC Sanjuana Kava, NP   4 Units at 03/03/12 1703  . insulin glargine (LANTUS) injection 10 Units  10 Units Subcutaneous Once Wonda Cerise, MD   10 Units at 03/03/12 0703  . insulin glargine (LANTUS) injection 14 Units  14 Units Subcutaneous BID Sanjuana Kava, NP   14 Units at 03/03/12 1703  . levothyroxine (SYNTHROID, LEVOTHROID) tablet 137 mcg  137 mcg Oral Lowella Curb, NP   137 mcg at 03/03/12 1205  . magnesium hydroxide (MILK OF MAGNESIA) suspension 30 mL  30 mL Oral Daily PRN Curlene Labrum Readling, MD      . metoCLOPramide (REGLAN) tablet 10 mg  10 mg Oral QID Sanjuana Kava, NP   10 mg at 03/03/12 1701  . nicotine (NICODERM CQ - dosed in mg/24 hours) patch 21 mg  21 mg Transdermal Daily Mike Craze, MD   21 mg at 03/03/12 1306  .  nitrofurantoin (MACRODANTIN) capsule 100 mg  100 mg Oral QHS Sanjuana Kava, NP      . pantoprazole (PROTONIX) EC tablet 80 mg  80 mg Oral BID AC Wonda Cerise, MD   80 mg at 03/03/12 1701  . potassium chloride SA (K-DUR,KLOR-CON) CR tablet 20 mEq  20 mEq Oral BID Sanjuana Kava, NP   20 mEq at 03/03/12 1702  . traZODone (DESYREL) tablet 100 mg  100 mg Oral QHS PRN Curlene Labrum Readling, MD   100 mg at 03/03/12 0036  . traZODone (DESYREL) tablet 100 mg  100 mg Oral QHS Sanjuana Kava, NP      . DISCONTD: insulin aspart (novoLOG) injection 10-15 Units  10-15 Units Subcutaneous Once Wonda Cerise, MD      . DISCONTD: pantoprazole (PROTONIX) EC tablet 40 mg  40 mg Oral Daily Sanjuana Kava, NP        Lab Results:  Results for orders placed during the hospital encounter of 03/02/12 (from the past 48  hour(s))  GLUCOSE, CAPILLARY     Status: Abnormal   Collection Time   03/03/12 12:25 AM      Component Value Range Comment   Glucose-Capillary 252 (*) 70 - 99 mg/dL   GLUCOSE, CAPILLARY     Status: Abnormal   Collection Time   03/03/12  6:00 AM      Component Value Range Comment   Glucose-Capillary 383 (*) 70 - 99 mg/dL   GLUCOSE, CAPILLARY     Status: Abnormal   Collection Time   03/03/12 11:39 AM      Component Value Range Comment   Glucose-Capillary 151 (*) 70 - 99 mg/dL   GLUCOSE, CAPILLARY     Status: Abnormal   Collection Time   03/03/12  4:39 PM      Component Value Range Comment   Glucose-Capillary 343 (*) 70 - 99 mg/dL    Comment 1 Notify RN       Physical Findings: AIMS:  , ,  ,  ,    CIWA:  CIWA-Ar Total: 0  COWS:  COWS Total Score: 2   Treatment Plan Summary: Daily contact with patient to assess and evaluate symptoms and progress in treatment Medication management No signs/symptoms of withdrawal and mood/anxiety less than 3/10 where 1 is teh best and 10 is the worst  Plan: Admit, start Lexapro for depression and Neurontin for anxiety and fibromyalgia.  Discussed the risks, benefits, and probable clinical course with and without treatment.  Pt is agreeable to the current course of treatment.  Shawonda Kerce 03/03/2012, 6:08 PM

## 2012-03-03 NOTE — Progress Notes (Signed)
Pt was a little restless when she first woke-up and did not attended all morning groups. As the day has gone on pt has attend groups. Pt rates depression at a 8 and hopelessness at a 7. Pt was offered support and encouragement. Pt denies SI/HI. Pt is receptive to treatment and safety is maintained on unit.
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
## 2012-03-03 NOTE — Treatment Plan (Addendum)
Interdisciplinary Treatment Plan Update (Adult)  Date:  03/03/2012  Time Reviewed:  10:30 AM   Progress in Treatment: Attending groups:   Yes   Participating in groups:  Yes Taking medication as prescribed:  Yes Tolerating medication:  Yes Family/Significant othe contact made:  Patient understands diagnosis:  Yes Discussing patient identified problems/goals with staff: Yes Medical problems stabilized or resolved: Yes Denies suicidal/homicidal ideation:Yes Issues/concerns per patient self-inventory:  Other:  New problem(s) identified:  Reason for Continuation of Hospitalization: Anxiety Depression Medication stabilization Suicidal ideation  Interventions implemented related to continuation of hospitalization:  Medication Management; safety checks q 15 mins  Additional comments:  Estimated length of stay: 3-5 days  Discharge Plan:  Referral to be made to [facility]  New goal(s):  Review of initial/current patient goals per problem list:    1.  Goal(s): Eliminate SI/other thoughts of self harm   Met:  No  Target date: d/c  As evidenced by: Patient will no longer endorse SI/other thought self harm   2.  Goal (s): Reduce depression/anxiety (currently rates all symptoms at ten)   Met:  No  Target date: d/c  As evidenced by: Patient will rate symptoms at four or below    3.  Goal(s): .stabilize on meds   Met:  No  Target date: d/c  As evidenced by:  Patient will report being stable on medications - symptoms have decreased    4.  Goal(s):  Refer for residential treatment   Met:  No  Target date: d/c  As evidenced by: Patient will be referred to [facility]  Attendees: Patient:  [name] 03/03/2012  10:30 AM  Other: Chinita Greenland, RN 03/03/2012  10:30 AM  Physician:  Orson Aloe, MD 03/03/2012 10:30 AM   Nursing:   Omelia Blackwater 03/03/2012 10:30 AM   CaseManager:  Juline Patch, LCSW 03/03/2012 10:30 AM   Counselor:  Angus Palms, LCSW 03/03/2012  10:30 AM   Other:  Reyes Ivan, LCSWA

## 2012-03-03 NOTE — [facility] Counselor (Signed)
Adult Comprehensive Assessment  Patient ID: KATLYNNE MCKERCHER, female   DOB: 1960-11-29, 51 y.o.   MRN: [id number]  Information Source: Information source: Patient  Current Stressors:  Educational / Learning stressors: no stressors Employment / Job issues: disabled Family Relationships: does not get along with mother which makes her unable to visit her father in Development worker, community / Lack of resources (include bankruptcy): no stressors reported Housing / Lack of housing: moved in with brothers to care for father, then he was taken out to live with sister & mother Physical health (include injuries & life threatening diseases): GERD, COPD, Diabetes, Thyroid problems Social relationships: death of best friend, who was her biggest support Substance abuse: cocaine Bereavement / Loss: best friend died suddenly a week ago  Living/Environment/Situation:  Living Arrangements: Other relatives Living conditions (as described by patient or guardian): lives with brothers in the family home How long has patient lived in current situation?: a couple months What is atmosphere in current home: Chaotic  Family History:  Marital status: Divorced Divorced, when?: 1985 What types of issues is patient dealing with in the relationship?: none - no contact Does patient have children?: Yes How many children?: 1  How is patient's relationship with their children?: pretty good, 43 year old son  Childhood History:  By whom was/is the patient raised?: Both parents Description of patient's relationship with caregiver when they were a child: close with father, distant with mother Patient's description of current relationship with people who raised him/her: bad with mother, close to father Does patient have siblings?: Yes Number of Siblings: 3  Description of patient's current relationship with siblings: 2 brothers in the home with her, kind of good relationship but fight a lot "brother and sister stuff", 1 sister  - not very close Did patient suffer any verbal/emotional/physical/sexual abuse as a child?: No Did patient suffer from severe childhood neglect?: No Has patient ever been sexually abused/assaulted/raped as an adolescent or adult?: No Was the patient ever a victim of a crime or a disaster?: No Witnessed domestic violence?: No Has patient been effected by domestic violence as an adult?: No  Education:  Highest grade of school patient has completed: high school graduate Currently a Consulting civil engineer?: No Learning disability?: No  Employment/Work Situation:   Employment situation: On disability Why is patient on disability: depression, diabetes and COPD How long has patient been on disability: 20 years Patient's job has been impacted by current illness: No What is the longest time patient has a held a job?: 8 or 9 years 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 Has patient ever been in the [employer]?: No Has patient ever served in Buyer, retail?: No  Financial Resources:   Surveyor, quantity resources: Field seismologist SSI Does patient have a Lawyer or guardian?: No  Alcohol/Substance Abuse:   What has been your use of drugs/alcohol within the last 12 months?: initially reports clean for 6 months but when challenged on UDS being positive for cocaine, she reports she needs help with that addiction; using about twice a week for 3 years If attempted suicide, did drugs/alcohol play a role in this?: No (threatened to overdose) Alcohol/Substance Abuse Treatment Hx: Past Tx, Inpatient If yes, describe treatment: [facility], [facility], [facility] (all were for SA and suicidality) Has alcohol/substance abuse ever caused legal problems?: No  Social Support System:   Forensic psychologist System: Poor Describe Community Support System: one friend and adult son Type of faith/religion: Ephriam Knuckles How does patient's  faith help to cope with current illness?:  attends church and has some support from church family  Leisure/Recreation:   Leisure and Hobbies: reading  Strengths/Needs:   What things does the patient do well?: a good person, gets along well with others In what areas does patient struggle / problems for patient: best friend died suddenly last weekend, father is in declining health with Hospice care and she cannot see him due to not getting along with her mother, depressed over losses, suicidal thoughts, meidcal problems, cocaine abuse  Discharge Plan:   Does patient have access to transportation?: No Plan for no access to transportation at discharge: sherrif transport due to IVC Will patient be returning to same living situation after discharge?: Yes Currently receiving community mental health services: Yes (From Whom) ([facility] in [place]) If no, would patient like referral for services when discharged?: No Does patient have financial barriers related to discharge medications?: No (already a patient [facility])  Summary/Recommendations:   Summary and Recommendations (to be completed by the evaluator): Citlalli is a 51 year old divorced female diagnosed with Bipolar Disorder and Cocaine Abuse. She reports depression began with father's illness (dementia and does not know who she is) and she recently moved into the family home to care for him when Hospice was called in. However, he and mother have since moved out of their home and in with sister, so Karaline now lives with her 2 brothers.  She reports having a best friend who was her major support, but he died of a massive heart attack last weekend. Since then she is feeling hopeless and does not see any purpose in living. Also relapsed on cocaine after 6 months sober.  Anijah would benefit from crisis stabilization, medication evalution, therapy groups for processing thoughts/feelings/experience, psychoed groups for coping skills and case management for discharge planning.   Lyn Hollingshead,  Lyndee Hensen. 03/03/2012

## 2012-03-03 NOTE — Progress Notes (Signed)
[name], female   DOB: 01/09/1961, 51 y.o.   MRN: [id number] Pt was pleasant and cooperative during the adm process. Stated that she's been depressed for apprx a year due to her father's illness. Initially, stated that she and her mother don't get along, and she is not allowed to visit her dad. Then after further discussion stated that her mother "won't come to pick her up so that she can visit her dad".  Pt became tearful when discussing the death of her "best friend" this weekend, who died of a massive heart attack. Pt lives with her 2 brothers in the "family home". Pt went to [facility] today and told them that she would OD or cut herself for a suicide attempt. 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 Pt is IDD and uses both novolog and lantus. States she had a seizure 2 months ago due to "bottoming out". Pt's uds was positive for cocaine. States, "she would also like some help with that, too". Support and encouragement were offered.

## 2012-03-04 DIAGNOSIS — F1420 Cocaine dependence, uncomplicated: Secondary | ICD-10-CM

## 2012-03-04 DIAGNOSIS — F1994 Other psychoactive substance use, unspecified with psychoactive substance-induced mood disorder: Principal | ICD-10-CM

## 2012-03-04 LAB — GLUCOSE, CAPILLARY
Glucose-Capillary: 314 mg/dL — ABNORMAL HIGH (ref 70–99)
Glucose-Capillary: 68 mg/dL — ABNORMAL LOW (ref 70–99)
Glucose-Capillary: 282 mg/dL — ABNORMAL HIGH (ref 70–99)

## 2012-03-04 LAB — HEMOGLOBIN A1C
Hgb A1c MFr Bld: 9.3 % — ABNORMAL HIGH (ref <5.7)
Mean Plasma Glucose: 220 mg/dL — ABNORMAL HIGH (ref <117)

## 2012-03-04 MED ORDER — ONDANSETRON HCL 4 MG PO TABS
4.0000 mg | ORAL_TABLET | Freq: Three times a day (TID) | ORAL | Status: DC | PRN
  Administered 2012-03-04 – 2012-03-08 (×5): 4 mg via ORAL
  Filled 2012-03-04 (×5): qty 1

## 2012-03-04 NOTE — Progress Notes (Signed)
Pt rates depression at a 6 and hopelessness at a 6. Pt has no complaints. Pt attends groups and interacts well with peers and staff. Pt was offered support and encouragement. Pt denies SI/HI. Pt is receptive to treatment and safety is maintained on unit.

## 2012-03-04 NOTE — Progress Notes (Signed)
Patient quiet and cooperative upon my assessment. Patient c/o nausea, states she has had intermittent nausea x10 years. Patient given PRN medication. Patient verbalizes this medication has worked for her in the past. Patient attends groups, interacts with peers. Patient denies SI/HI, denies A/V hallucinations. Patient offered support and encouragement. Patient remains safe on unit with Q15 minute checks for safety. Will continue to monitor.

## 2012-03-04 NOTE — Progress Notes (Signed)
Patient seen during d/c planning group.  She reports being better today but endorses passive SI.  Patient is able to contract for safety.  Patient is rating all symptoms at five.  Patient shared she continues to plan on discharging to [facility].  Writer shared we will call [facility] on the day she ready to discharge as they do not schedule in advance.

## 2012-03-04 NOTE — Progress Notes (Signed)
[facility] Group Notes: (Counselor/Nursing/MHT/Case Management/Adjunct)  03/04/2012 @1 :15pm  [facility]  Type of Therapy: Group Therapy   Participation Level: Good   Participation Quality: Good   Affect: Appropriate   Cognitive: Appropriate   Insight: Good   Engagement in Group: Good   Engagement in Therapy: Good   Modes of Intervention: Support and Exploration   Summary of Progress/Problems: [name] participated with in exploration types of support and of the recovery model. Was an active listener in discussion of the supports offered by [facility]. She expressed interest in programs [facility] offers.   Billie Lade  03/04/2012 3:06 PM

## 2012-03-04 NOTE — Progress Notes (Signed)
[facility] Adult Inpatient Family/Significant Other Suicide Prevention Education  Suicide Prevention Education:  Education Completed; Babette Relic, son, [phone]) has been identified by the patient as the family member/significant other who will aid the patient in the event of a mental health crisis (suicidal ideations/suicide attempt).  With written consent from the patient, the family member/significant other has been provided the following suicide prevention education, prior to the and/or following the discharge of the patient.  The suicide prevention education provided includes the following:  Suicide risk factors  Suicide prevention and interventions  National Suicide Hotline telephone number  [facility] assessment telephone number  [facility] Emergency Assistance 911  [facility] and/or Residential Mobile Crisis Unit telephone number  Request made of family/significant other to:  Remove weapons (e.g., guns, rifles, knives), all items previously/currently identified as safety concern.    Remove drugs/medications (over-the-counter, prescriptions, illicit drugs), all items previously/currently identified as a safety concern.  Fredrik Cove was unaware that Avalina is in the hospital. He stated that he spoke to her a few days ago and did not know there was a crisis, but that he does not see or talk to her all that often as he has his own life he is engaged in. He was unaware of any previous suicide attempts, and states that his mother does not talk to him about "stuff like that" when they are together. Fredrik Cove stated that his uncles, with whom Nila lives, do not keep guns in their home and therefore he does not believe she has access to firearms. He verbalized understanding of suicide prevention information and had no further questions. Fredrik Cove would like to contact Beyla this evening, and this information was passed to [place] so that she can give him her code.    Billie Lade 03/04/2012, 11:33 AM

## 2012-03-04 NOTE — Progress Notes (Signed)
[facility] MD Progress Note  03/04/2012 11:15 PM  Diagnosis:   Axis I: Bereavement, Substance Induced Mood Disorder and Cocaine Dependence Axis III:  Past Medical History  Diagnosis Date  . Hypothyroid   . Anxiety   . Diabetes mellitus     insulin  . COPD (chronic obstructive pulmonary disease)   . GERD (gastroesophageal reflux disease)   . PUD (peptic ulcer disease)     ? remote past, no reports from [place] or [facility], states possibly Dr. Karilyn Cota.   . Hiatal hernia     ADL's:  Intact  Sleep: Good  Appetite:  Good  Suicidal Ideation:  Pt denies suicidal ideation Homicidal Ideation:  Denies adamantly any homicidal thoughts.  Mental Status Examination/Evaluation: Objective:  Appearance: Casual  Eye Contact::  Good  Speech:  Clear and Coherent  Volume:  Normal  Mood:  Dysphoric  Affect:  Congruent  Thought Process:  Coherent  Orientation:  Full  Thought Content:  WDL  Suicidal Thoughts:  No  Homicidal Thoughts:  No  Memory:  Immediate;   Fair  Judgement:  Impaired  Insight:  Lacking  Psychomotor Activity:  Normal  Concentration:  Fair  Recall:  Fair  Akathisia:  No  AIMS (if indicated):     Assets:  Communication Skills Desire for Improvement  Sleep:  Number of Hours: 5.5    ROS: Neuro: no headaches, ataxia, weakness, was a little woozy at first and just after she takes the Neurontin.  This will clear up in a day of two.  Pt is okay with that. Her anxiety is much better.  GI: no V/D/cramps, constipation relieved by MOM, still having nausea, will order Zofran for that.  MS: no weakness, muscle cramps, aches.   Vital Signs:Blood pressure 115/75, pulse 85, temperature 97.1 F (36.2 C), temperature source Oral, resp. rate 15, height 5\' 5"  (1.651 m), weight 75.751 kg (167 lb). Current Medications: Current Facility-Administered Medications  Medication Dose Route Frequency Provider Last Rate Last Dose  . acetaminophen (TYLENOL) tablet 650 mg  650 mg Oral Q6H PRN Curlene Labrum  Readling, MD   650 mg at 03/04/12 0620  . albuterol (PROVENTIL HFA;VENTOLIN HFA) 108 (90 BASE) MCG/ACT inhaler 2 puff  2 puff Inhalation Q6H PRN Sanjuana Kava, NP      . alum & mag hydroxide-simeth (MAALOX/MYLANTA) 200-200-20 MG/5ML suspension 30 mL  30 mL Oral Q4H PRN Curlene Labrum Readling, MD   30 mL at 03/03/12 2138  . escitalopram (LEXAPRO) tablet 10 mg  10 mg Oral Daily Sanjuana Kava, NP   10 mg at 03/04/12 0819  . gabapentin (NEURONTIN) capsule 300 mg  300 mg Oral QID Mike Craze, MD   300 mg at 03/04/12 1957  . insulin aspart (novoLOG) injection 0-15 Units  0-15 Units Subcutaneous TID WC Sanjuana Kava, NP   11 Units at 03/04/12 1707  . insulin aspart (novoLOG) injection 4 Units  4 Units Subcutaneous TID WC Sanjuana Kava, NP   4 Units at 03/04/12 1707  . insulin glargine (LANTUS) injection 14 Units  14 Units Subcutaneous BID Sanjuana Kava, NP   14 Units at 03/04/12 0819  . levothyroxine (SYNTHROID, LEVOTHROID) tablet 137 mcg  137 mcg Oral Lowella Curb, NP   137 mcg at 03/04/12 0617  . magnesium hydroxide (MILK OF MAGNESIA) suspension 30 mL  30 mL Oral Daily PRN Curlene Labrum Readling, MD   30 mL at 03/04/12 0625  . metoCLOPramide (REGLAN) tablet 10 mg  10 mg Oral QID Sanjuana Kava, NP   10 mg at 03/04/12 2147  . nicotine (NICODERM CQ - dosed in mg/24 hours) patch 21 mg  21 mg Transdermal Daily Mike Craze, MD   21 mg at 03/04/12 0600  . nitrofurantoin (macrocrystal-monohydrate) (MACROBID) capsule 100 mg  100 mg Oral QHS Mike Craze, MD   100 mg at 03/04/12 2147  . ondansetron (ZOFRAN) tablet 4 mg  4 mg Oral Q8H PRN Mike Craze, MD   4 mg at 03/04/12 2147  . pantoprazole (PROTONIX) EC tablet 80 mg  80 mg Oral BID AC Wonda Cerise, MD   80 mg at 03/04/12 1706  . potassium chloride SA (K-DUR,KLOR-CON) CR tablet 20 mEq  20 mEq Oral BID Sanjuana Kava, NP   20 mEq at 03/04/12 1706  . traZODone (DESYREL) tablet 100 mg  100 mg Oral QHS PRN Curlene Labrum Readling, MD   100 mg at 03/03/12 0036  .  traZODone (DESYREL) tablet 100 mg  100 mg Oral QHS Sanjuana Kava, NP   100 mg at 03/04/12 2147    Lab Results:  Results for orders placed during the hospital encounter of 03/02/12 (from the past 48 hour(s))  GLUCOSE, CAPILLARY     Status: Abnormal   Collection Time   03/03/12 12:25 AM      Component Value Range Comment   Glucose-Capillary 252 (*) 70 - 99 mg/dL   GLUCOSE, CAPILLARY     Status: Abnormal   Collection Time   03/03/12  6:00 AM      Component Value Range Comment   Glucose-Capillary 383 (*) 70 - 99 mg/dL   GLUCOSE, CAPILLARY     Status: Abnormal   Collection Time   03/03/12 11:39 AM      Component Value Range Comment   Glucose-Capillary 151 (*) 70 - 99 mg/dL   GLUCOSE, CAPILLARY     Status: Abnormal   Collection Time   03/03/12  4:39 PM      Component Value Range Comment   Glucose-Capillary 343 (*) 70 - 99 mg/dL    Comment 1 Notify RN     HEMOGLOBIN A1C     Status: Abnormal   Collection Time   03/03/12  7:44 PM      Component Value Range Comment   Hemoglobin A1C 9.3 (*) <5.7 %    Mean Plasma Glucose 220 (*) <117 mg/dL   GLUCOSE, CAPILLARY     Status: Abnormal   Collection Time   03/03/12  8:52 PM      Component Value Range Comment   Glucose-Capillary 271 (*) 70 - 99 mg/dL   GLUCOSE, CAPILLARY     Status: Abnormal   Collection Time   03/04/12  6:02 AM      Component Value Range Comment   Glucose-Capillary 314 (*) 70 - 99 mg/dL   GLUCOSE, CAPILLARY     Status: Abnormal   Collection Time   03/04/12  9:44 AM      Component Value Range Comment   Glucose-Capillary 68 (*) 70 - 99 mg/dL   GLUCOSE, CAPILLARY     Status: Abnormal   Collection Time   03/04/12 11:43 AM      Component Value Range Comment   Glucose-Capillary 149 (*) 70 - 99 mg/dL   GLUCOSE, CAPILLARY     Status: Abnormal   Collection Time   03/04/12  5:04 PM      Component Value Range Comment   Glucose-Capillary 340 (*)  70 - 99 mg/dL   GLUCOSE, CAPILLARY     Status: Abnormal   Collection Time   03/04/12   9:45 PM      Component Value Range Comment   Glucose-Capillary 282 (*) 70 - 99 mg/dL    Comment 1 Notify RN       Physical Findings: AIMS:  , ,  ,  ,    CIWA:  CIWA-Ar Total: 0  COWS:  COWS Total Score: 2   Treatment Plan Summary: Daily contact with patient to assess and evaluate symptoms and progress in treatment Medication management No signs/symptoms of withdrawal and mood/anxiety less than 3/10 where 1 is teh best and 10 is the worst  Plan: Pt reports doing much better.  The anxiety is better with the Neurotin, but she is having some wooziness just after each dose.  This should pass.  Pt hopes to go to [facility].  Sevan Mcbroom 03/04/2012, 11:15 PM

## 2012-03-05 LAB — GLUCOSE, CAPILLARY

## 2012-03-05 MED ORDER — DULOXETINE HCL 20 MG PO CPEP
20.0000 mg | ORAL_CAPSULE | Freq: Every day | ORAL | Status: DC
  Administered 2012-03-05 – 2012-03-08 (×4): 20 mg via ORAL
  Filled 2012-03-05 (×5): qty 1

## 2012-03-05 MED ORDER — INSULIN ASPART 100 UNIT/ML ~~LOC~~ SOLN
15.0000 [IU] | Freq: Once | SUBCUTANEOUS | Status: AC
  Administered 2012-03-05: 15 [IU] via SUBCUTANEOUS

## 2012-03-05 MED ORDER — TRAZODONE HCL 50 MG PO TABS
150.0000 mg | ORAL_TABLET | Freq: Every day | ORAL | Status: DC
  Administered 2012-03-05 – 2012-03-07 (×3): 150 mg via ORAL
  Filled 2012-03-05 (×4): qty 3

## 2012-03-05 MED ORDER — GABAPENTIN 100 MG PO CAPS
200.0000 mg | ORAL_CAPSULE | Freq: Four times a day (QID) | ORAL | Status: DC
  Administered 2012-03-05 – 2012-03-08 (×13): 200 mg via ORAL
  Filled 2012-03-05 (×16): qty 2

## 2012-03-05 NOTE — Progress Notes (Signed)
Psychoeducational Group Note  Date:  03/05/2012 Time:  2356  Group Topic/Focus:  Wrap-Up Group:   The focus of this group is to help patients review their daily goal of treatment and discuss progress on daily workbooks.  Participation Level:  None  Participation Quality:  Appropriate, Attentive and Supportive  Affect:  Flat  Cognitive:  Alert and Appropriate  Insight:  Good  Engagement in Group:  Good  Additional Comments:  Pt was taken out of group by RN and did not return to wrap up group.  While pt in group, pt was supportive and attentive to peers.  Pt was unable to share in group before taken out of group by RN.  [name], Lorin Gawron L 03/05/2012, 11:56 PM

## 2012-03-05 NOTE — Progress Notes (Signed)
Inpatient Diabetes Program Recommendations  AACE/ADA: New Consensus Statement on Inpatient Glycemic Control (2009)  Target Ranges:  Prepandial:   less than 140 mg/dL      Peak postprandial:   less than 180 mg/dL (1-2 hours)      Critically ill patients:  140 - 180 mg/dL   Reason for Visit: Hyperglycemia   Results for [name], [name] (MRN [id number]) as of 03/05/2012 15:01  Ref. Range 03/04/2012 09:44 03/04/2012 11:43 03/04/2012 17:04 03/04/2012 21:45 03/05/2012 06:10 03/05/2012 10:13  Glucose-Capillary Latest Range: 70-99 mg/dL 68 (L) 528 (H) 413 (H) 282 (H) 328 (H) 139 (H)  Results for [name], [name] (MRN [id number]) as of 03/05/2012 15:01  Ref. Range 03/03/2012 19:44  Hemoglobin A1C Latest Range: <5.7 % 9.3 (H)  Results for [name], [name] (MRN [id number]) as of 03/05/2012 15:01  Ref. Range 03/02/2012 13:14  Sodium Latest Range: 135-145 mEq/L 139  Potassium Latest Range: 3.5-5.1 mEq/L 4.1  Chloride Latest Range: 96-112 mEq/L 103  CO2 Latest Range: 19-32 mEq/L 27  BUN Latest Range: 6-23 mg/dL 15  Creat Latest Range: 0.50-1.10 mg/dL 7.42  Calcium Latest Range: 8.4-10.5 mg/dL 9.8  GFR calc non Af Amer Latest Range: >90 mL/min 82 (L)  GFR calc Af Amer Latest Range: >90 mL/min >90  Glucose Latest Range: 70-99 mg/dL 595 (H)      Note: According to [facility], Lantus was not given last night at 1700.  CBGs high last night and today. Pt will need adjustment in diabetes meds prior to discharge.   Recommend:  Lantus 14 units bid (ac Breakfast and QHS) and Novolog 6 units tidwc.  Add HS correction.  Will follow.

## 2012-03-05 NOTE — Progress Notes (Signed)
[facility] MD Progress Note  03/05/2012 9:49 AM  Diagnosis:   Axis I: Bereavement, Substance Induced Mood Disorder and Cocaine Dependence Axis III:  Past Medical History  Diagnosis Date  . Hypothyroid   . Anxiety   . Diabetes mellitus     insulin  . COPD (chronic obstructive pulmonary disease)   . GERD (gastroesophageal reflux disease)   . PUD (peptic ulcer disease)     ? remote past, no reports from [place] or [facility], states possibly Dr. Karilyn Cota.   . Hiatal hernia     ADL's:  Intact  Sleep: Fair, had a new roommate that came in in the middle of the night.  She is feeling quite tired, groggy, and sore today.  Will back down on the Neurontin to minimize the grogginess, but will add Cymbalta to help with the pain.  Appetite:  Fair  Suicidal Ideation:  Pt denies suicidal ideation Homicidal Ideation:  Denies adamantly any homicidal thoughts.  Mental Status Examination/Evaluation: Objective:  Appearance: Casual  Eye Contact::  Good  Speech:  Clear and Coherent  Volume:  Normal  Mood:  Anxious, Dysphoric and Irritable  Affect:  Congruent  Thought Process:  Coherent  Orientation:  Full  Thought Content:  WDL  Suicidal Thoughts:  No  Homicidal Thoughts:  No  Memory:  Immediate;   Fair  Judgement:  Fair  Insight:  Fair  Psychomotor Activity:  Normal  Concentration:  Fair  Recall:  Fair  Akathisia:  No  AIMS (if indicated):     Assets:  Communication Skills Desire for Improvement  Sleep:  Number of Hours: 6.75    ROS: Neuro: dizziness, headaches, and ataxia are a problem today.  Will adjust the Neurontin and add Cymbalta to help with that.  GI: Zopfran helps her nausea.  Her insurance doesn't pay for it.  That is why she is on the promethezine.  Constipation is better.  MS: slight muscle cramps and aches today.  Vital Signs:Blood pressure 103/70, pulse 84, temperature 97.7 F (36.5 C), temperature source Oral, resp. rate 18, height 5\' 5"  (1.651 m), weight 75.751 kg (167 lb),  SpO2 98.00%. Current Medications: Current Facility-Administered Medications  Medication Dose Route Frequency Provider Last Rate Last Dose  . acetaminophen (TYLENOL) tablet 650 mg  650 mg Oral Q6H PRN Ronny Bacon, MD   650 mg at 03/05/12 0813  . albuterol (PROVENTIL HFA;VENTOLIN HFA) 108 (90 BASE) MCG/ACT inhaler 2 puff  2 puff Inhalation Q6H PRN Sanjuana Kava, NP      . alum & mag hydroxide-simeth (MAALOX/MYLANTA) 200-200-20 MG/5ML suspension 30 mL  30 mL Oral Q4H PRN Curlene Labrum Readling, MD   30 mL at 03/03/12 2138  . DULoxetine (CYMBALTA) DR capsule 20 mg  20 mg Oral Daily Mike Craze, MD      . escitalopram (LEXAPRO) tablet 10 mg  10 mg Oral Daily Sanjuana Kava, NP   10 mg at 03/05/12 0813  . gabapentin (NEURONTIN) capsule 200 mg  200 mg Oral QID Mike Craze, MD      . insulin aspart (novoLOG) injection 0-15 Units  0-15 Units Subcutaneous TID WC Sanjuana Kava, NP   11 Units at 03/05/12 0641  . insulin aspart (novoLOG) injection 4 Units  4 Units Subcutaneous TID WC Sanjuana Kava, NP   4 Units at 03/05/12 0640  . insulin glargine (LANTUS) injection 14 Units  14 Units Subcutaneous BID Sanjuana Kava, NP   14 Units at 03/05/12 [phone]  .  levothyroxine (SYNTHROID, LEVOTHROID) tablet 137 mcg  137 mcg Oral Lowella Curb, NP   137 mcg at 03/05/12 0641  . magnesium hydroxide (MILK OF MAGNESIA) suspension 30 mL  30 mL Oral Daily PRN Curlene Labrum Readling, MD   30 mL at 03/04/12 0625  . metoCLOPramide (REGLAN) tablet 10 mg  10 mg Oral QID Sanjuana Kava, NP   10 mg at 03/05/12 0813  . nicotine (NICODERM CQ - dosed in mg/24 hours) patch 21 mg  21 mg Transdermal Daily Mike Craze, MD   21 mg at 03/05/12 0641  . nitrofurantoin (macrocrystal-monohydrate) (MACROBID) capsule 100 mg  100 mg Oral QHS Mike Craze, MD   100 mg at 03/04/12 2147  . ondansetron (ZOFRAN) tablet 4 mg  4 mg Oral Q8H PRN Mike Craze, MD   4 mg at 03/04/12 2147  . pantoprazole (PROTONIX) EC tablet 80 mg  80 mg Oral BID AC  Wonda Cerise, MD   80 mg at 03/05/12 0641  . potassium chloride SA (K-DUR,KLOR-CON) CR tablet 20 mEq  20 mEq Oral BID Sanjuana Kava, NP   20 mEq at 03/05/12 1610  . traZODone (DESYREL) tablet 100 mg  100 mg Oral QHS PRN Curlene Labrum Readling, MD   100 mg at 03/03/12 0036  . traZODone (DESYREL) tablet 150 mg  150 mg Oral QHS Mike Craze, MD      . DISCONTD: gabapentin (NEURONTIN) capsule 300 mg  300 mg Oral QID Mike Craze, MD   300 mg at 03/05/12 9604  . DISCONTD: traZODone (DESYREL) tablet 100 mg  100 mg Oral QHS Sanjuana Kava, NP   100 mg at 03/04/12 2147    Lab Results:  Results for orders placed during the hospital encounter of 03/02/12 (from the past 48 hour(s))  GLUCOSE, CAPILLARY     Status: Abnormal   Collection Time   03/03/12 11:39 AM      Component Value Range Comment   Glucose-Capillary 151 (*) 70 - 99 mg/dL   GLUCOSE, CAPILLARY     Status: Abnormal   Collection Time   03/03/12  4:39 PM      Component Value Range Comment   Glucose-Capillary 343 (*) 70 - 99 mg/dL    Comment 1 Notify RN     HEMOGLOBIN A1C     Status: Abnormal   Collection Time   03/03/12  7:44 PM      Component Value Range Comment   Hemoglobin A1C 9.3 (*) <5.7 %    Mean Plasma Glucose 220 (*) <117 mg/dL   GLUCOSE, CAPILLARY     Status: Abnormal   Collection Time   03/03/12  8:52 PM      Component Value Range Comment   Glucose-Capillary 271 (*) 70 - 99 mg/dL   GLUCOSE, CAPILLARY     Status: Abnormal   Collection Time   03/04/12  6:02 AM      Component Value Range Comment   Glucose-Capillary 314 (*) 70 - 99 mg/dL   GLUCOSE, CAPILLARY     Status: Abnormal   Collection Time   03/04/12  9:44 AM      Component Value Range Comment   Glucose-Capillary 68 (*) 70 - 99 mg/dL   GLUCOSE, CAPILLARY     Status: Abnormal   Collection Time   03/04/12 11:43 AM      Component Value Range Comment   Glucose-Capillary 149 (*) 70 - 99 mg/dL   GLUCOSE, CAPILLARY  Status: Abnormal   Collection Time   03/04/12  5:04 PM       Component Value Range Comment   Glucose-Capillary 340 (*) 70 - 99 mg/dL   GLUCOSE, CAPILLARY     Status: Abnormal   Collection Time   03/04/12  9:45 PM      Component Value Range Comment   Glucose-Capillary 282 (*) 70 - 99 mg/dL    Comment 1 Notify RN     GLUCOSE, CAPILLARY     Status: Abnormal   Collection Time   03/05/12  6:10 AM      Component Value Range Comment   Glucose-Capillary 328 (*) 70 - 99 mg/dL    Comment 1 Notify RN      Comment 2 Documented in Chart       Physical Findings: AIMS:  , ,  ,  ,    CIWA:  CIWA-Ar Total: 0  COWS:  COWS Total Score: 2   Treatment Plan Summary: Daily contact with patient to assess and evaluate symptoms and progress in treatment Medication management No signs/symptoms of withdrawal and mood/anxiety less than 3/10 where 1 is teh best and 10 is the worst  Plan: Pt is not feeling as well today.  She is having some grogginess during the day.  That seems related to the Neurontin.  WIll reduce that dose and add Cymbalta for the pain relieving effect. Her irritability is perhaps stemming from her incomplete relief of her pain. Awaiting placement at [facility] when she is ready.  Jovon Winterhalter 03/05/2012, 9:49 AM

## 2012-03-05 NOTE — Progress Notes (Signed)
Patient seen during d/c planning group.  She reports not feeling well today, headache and back pain.  She rates depression at seven and anxiety at five.  She is hopeful to discharge to [facility] on Monday.

## 2012-03-05 NOTE — Progress Notes (Signed)
[facility] Group Notes:  (Counselor/Nursing/MHT/Case Management/Adjunct)  03/05/2012 1:15 PM  Type of Therapy:  Group Therapy, Dance/Movement Therapy   Participation Level:  Did Not Attend   [facility]

## 2012-03-05 NOTE — Progress Notes (Signed)
D: Pt has flat affect and a sad mood; pt rates depression and hopelessness both 8 out of 10 (1 low/10 high); pt complains of not feeling well, when asked why, pt has no main answer or concerns; A: Pt given emotional support and is encouraged to come to RN with any concerns or questions; pt given PRN tylenol for pain R: Pt remains appropriate and cooperative; will continue to monitor for safety

## 2012-03-06 DIAGNOSIS — F4321 Adjustment disorder with depressed mood: Secondary | ICD-10-CM

## 2012-03-06 LAB — COMPREHENSIVE METABOLIC PANEL
BUN: 21 mg/dL (ref 6–23)
Calcium: 9.6 mg/dL (ref 8.4–10.5)
Creatinine, Ser: 0.86 mg/dL (ref 0.50–1.10)
GFR calc Af Amer: 88 mL/min — ABNORMAL LOW (ref >90)
Glucose, Bld: 245 mg/dL — ABNORMAL HIGH (ref 70–99)
Total Protein: 7.4 g/dL (ref 6.0–8.3)

## 2012-03-06 LAB — GLUCOSE, CAPILLARY
Glucose-Capillary: 409 mg/dL — ABNORMAL HIGH (ref 70–99)
Glucose-Capillary: 116 mg/dL — ABNORMAL HIGH (ref 70–99)
Glucose-Capillary: 66 mg/dL — ABNORMAL LOW (ref 70–99)
Glucose-Capillary: 138 mg/dL — ABNORMAL HIGH (ref 70–99)
Glucose-Capillary: 97 mg/dL (ref 70–99)

## 2012-03-06 LAB — URINALYSIS, MICROSCOPIC ONLY
Ketones, ur: NEGATIVE mg/dL
Leukocytes, UA: NEGATIVE
Nitrite: NEGATIVE
Specific Gravity, Urine: 1.016 (ref 1.005–1.030)
pH: 6 (ref 5.0–8.0)

## 2012-03-06 MED ORDER — CIPROFLOXACIN HCL 250 MG PO TABS
250.0000 mg | ORAL_TABLET | Freq: Two times a day (BID) | ORAL | Status: DC
  Administered 2012-03-06 – 2012-03-08 (×4): 250 mg via ORAL
  Filled 2012-03-06 (×6): qty 1

## 2012-03-06 MED ORDER — INSULIN ASPART 100 UNIT/ML ~~LOC~~ SOLN
12.0000 [IU] | Freq: Once | SUBCUTANEOUS | Status: AC
  Administered 2012-03-06: 12 [IU] via SUBCUTANEOUS

## 2012-03-06 NOTE — Progress Notes (Signed)
[name] is seen out in the milieu today.Marland KitchenMarland KitchenShe has a flat, sad affect. She makes brief eye contact. She speaks with no emotion. She completed her self inventory and on it she rated her depression and hopelssness " 5 / 0 " and stated her DC plan was to : get clean". A She is medicated per MD order. Her cbg's are monitored before meals and she was given sliding scale insulin and meal coverage insulin according to MD order.She has attended her groups today...she is engaged in her POC and is actively trying to learn healthier wasy to get her needs met. R POC inlcudes continuing to maintain therapeutic relationship, foster truth and keep pt safe. PD RN [facility]

## 2012-03-06 NOTE — Progress Notes (Signed)
CBG: 66  Treatment: 15 GM carbohydrate snack  Symptoms: Sweaty  Follow-up CBG: Time:0630 CBG Result:138  Possible Reasons for Event: Other: Got insulin last night for high blood sugar  Comments/MD notified:Dr Angus Palms hospitalist notified; Said to continue with ordered insulin and for Physician to follow up with diabetic consultant or put in an order for patient to follow-up with internist.    [name]

## 2012-03-06 NOTE — Progress Notes (Signed)
[name], female   DOB: 1961/08/01, 51 y.o.   MRN: [id number]   Patient stated didn't feel good and did not go to breakfast. Staff will try to get her a tray. She has not received her meal coverage due to low blood sugar earlier and has not ate anything yet this am. 1st shift notified.

## 2012-03-06 NOTE — Progress Notes (Signed)
[facility] Group Notes:  (Counselor/Nursing/MHT/Case Management/Adjunct)  03/06/2012 4:52 PM  Type of Therapy:  Group Therapy  Participation Level:  Did Not Attend   [name] 03/06/2012, 4:52 PM

## 2012-03-06 NOTE — Progress Notes (Signed)
[name], female   DOB: 1961/09/21, 51 y.o.   MRN: [id number] Pt. remains calm and quiet, looks tired. CBG = 409mg /dL; Dr. Ferol Luz called and repeat CBG ordered and done@ 22:00-499mg /dL.  Hospitalist Dr, Claiborne Billings called and he referred to Dr. Elenora Gamma for follow-up care.  Per Dr. Angus Palms, Pt. was given additional insulin and will have another CBG drawn in early AM and depending on the results, will have further follow-up care beyond the present orders.  Pt. Is calm and understands these proceedings.  Pt. Went back to sleep after receiving the additional insulin.

## 2012-03-06 NOTE — Progress Notes (Signed)
  [name] is a 51 y.o. female [id number] [birth year]  03/02/2012 Principal Problem:  *Cocaine dependence Active Problems:  Bereavement due to life event   Mental Status: Mood feels sick -wonders if she is starting a UA and her blood sugars have had unusual variances today. Denies SI/HI/AVh  Subjective/Objective: Look ill.     Filed Vitals:   03/06/12 0731  BP: 98/68  Pulse: 98  Temp:   Resp:     Lab Results:   BMET    Component Value Date/Time   NA 139 03/02/2012 1314   K 4.1 03/02/2012 1314   CL 103 03/02/2012 1314   CO2 27 03/02/2012 1314   GLUCOSE 181* 03/02/2012 1314   BUN 15 03/02/2012 1314   CREATININE 0.81 03/02/2012 1314   CALCIUM 9.8 03/02/2012 1314   GFRNONAA 82* 03/02/2012 1314   GFRAA >90 03/02/2012 1314    Medications:  Scheduled:     . DULoxetine  20 mg Oral Daily  . escitalopram  10 mg Oral Daily  . gabapentin  200 mg Oral QID  . insulin aspart  0-15 Units Subcutaneous TID WC  . insulin aspart  12 Units Subcutaneous Once  . insulin aspart  15 Units Subcutaneous Once  . insulin aspart  4 Units Subcutaneous TID WC  . insulin glargine  14 Units Subcutaneous BID  . levothyroxine  137 mcg Oral BH-q7a  . metoCLOPramide  10 mg Oral QID  . nicotine  21 mg Transdermal Daily  . nitrofurantoin (macrocrystal-monohydrate)  100 mg Oral QHS  . pantoprazole  80 mg Oral BID AC  . potassium chloride  20 mEq Oral BID  . traZODone  150 mg Oral QHS     PRN Meds acetaminophen, albuterol, alum & mag hydroxide-simeth, magnesium hydroxide, ondansetron, traZODone Plan  Check UA             Start Cipro after collecting UA.  [name],[name] 03/06/2012

## 2012-03-06 NOTE — Progress Notes (Signed)
[name], female   DOB: 1961/01/10, 51 y.o.   MRN: [id number]   Patient lying in bed with eyes closed. Respirations even and non-labored. Staff will continue to montor throughout shift

## 2012-03-07 LAB — GLUCOSE, CAPILLARY: Glucose-Capillary: 111 mg/dL — ABNORMAL HIGH (ref 70–99)

## 2012-03-07 NOTE — Progress Notes (Signed)
  [name] is a 51 y.o. female [id number] January 07, 1961  03/02/2012 Principal Problem:  *Cocaine dependence Active Problems:  Bereavement due to life event   Mental Status: Mood is better as she physically feels better. Denies SI/HI/AVH.    Subjective/Objective: UA was neg but is feeling better with Cipro will finish 3 day course. Encouraged her to go to group.    Filed Vitals:   03/07/12 0701  BP: 111/71  Pulse: 90  Temp:   Resp:     Lab Results:   BMET    Component Value Date/Time   NA 135 03/06/2012 1929   K 4.5 03/06/2012 1929   CL 96 03/06/2012 1929   CO2 25 03/06/2012 1929   GLUCOSE 245* 03/06/2012 1929   BUN 21 03/06/2012 1929   CREATININE 0.86 03/06/2012 1929   CALCIUM 9.6 03/06/2012 1929   GFRNONAA 76* 03/06/2012 1929   GFRAA 88* 03/06/2012 1929    Medications:  Scheduled:     . ciprofloxacin  250 mg Oral BID  . DULoxetine  20 mg Oral Daily  . escitalopram  10 mg Oral Daily  . gabapentin  200 mg Oral QID  . insulin aspart  0-15 Units Subcutaneous TID WC  . insulin aspart  4 Units Subcutaneous TID WC  . insulin glargine  14 Units Subcutaneous BID  . levothyroxine  137 mcg Oral BH-q7a  . metoCLOPramide  10 mg Oral QID  . nicotine  21 mg Transdermal Daily  . nitrofurantoin (macrocrystal-monohydrate)  100 mg Oral QHS  . pantoprazole  80 mg Oral BID AC  . traZODone  150 mg Oral QHS     PRN Meds acetaminophen, albuterol, alum & mag hydroxide-simeth, magnesium hydroxide, ondansetron, traZODone Plan: Diabetes is fluctuating more than normal.           Continue current plan of care .  [name],[name] 03/07/2012

## 2012-03-07 NOTE — Progress Notes (Signed)
[facility] Group Notes:  (Counselor/Nursing/MHT/Case Management/Adjunct)  03/07/2012 11:40 AM  Type of Therapy:  After care Planning group  Pt. participated in after care planning group and pt. Was given [facility] Suicide Prevention pamphlet along with crisis hotline numbers. Pt. agreed to use them if needed. Pt. Was given information on free support groups and information about the [facility] located in [place], [place]. Pt. Stated she came to [facility] due to her SI thoughts. The pt. Denied SI or HI  today and states he saw te doctor last week and they changed her medications.     [name] [place] 03/07/2012, 11:40 AM

## 2012-03-07 NOTE — Progress Notes (Signed)
[name] is seen out in the milieu today...she is depressed, cooperative and easy to get along with . She completed her self inventory and on it she wrote she denied SI, she rated her depression and hopelessness " 5 / 0 " and stated her DC plan was to : " stay clean and get a new home".  A She attends her groups and is engaged in her POC. She requested and was given 2 tylenol for back pain that she rated a " 6 out of 10" and stated releif from the pain 1 hr later. She stated she has been grinding her teeth today and this RN passed this on to the pharmacist as well as the MD today. R POC includes continuing to maintain pt's safety, foster therapeutic relationship and work with pt on learning to establish healthier coping mechansims. PD RN [facility]

## 2012-03-07 NOTE — Progress Notes (Signed)
[facility] Group Notes:  (Counselor/Nursing/MHT/Case Management/Adjunct)  03/07/2012 4:14 PM  Type of Therapy:  Group Therapy  Participation Level:  Did Not Attend    [name] 03/07/2012, 4:14 PM

## 2012-03-07 NOTE — Progress Notes (Signed)
Pt had minimal interaction with other patients this evening coming out of her room only to request medications. Pt mood depressed, flat affect, with poor eye contact during conversations. Pt denies SI, difficult to engage pt in conversation. Continue to monitor pt CBG's and treat appropriately.

## 2012-03-08 ENCOUNTER — Ambulatory Visit: Payer: Medicare Other | Admitting: Gastroenterology

## 2012-03-08 ENCOUNTER — Telehealth: Payer: Self-pay | Admitting: Gastroenterology

## 2012-03-08 LAB — GLUCOSE, CAPILLARY
Glucose-Capillary: 253 mg/dL — ABNORMAL HIGH (ref 70–99)
Glucose-Capillary: 149 mg/dL — ABNORMAL HIGH (ref 70–99)

## 2012-03-08 MED ORDER — INSULIN ASPART 100 UNIT/ML ~~LOC~~ SOLN: 10.0000 [IU] | Freq: Three times a day (TID) | SUBCUTANEOUS | Status: DC

## 2012-03-08 MED ORDER — ESCITALOPRAM OXALATE 10 MG PO TABS: 10.0000 mg | ORAL_TABLET | Freq: Every day | ORAL | Status: DC

## 2012-03-08 MED ORDER — GABAPENTIN 100 MG PO CAPS: 200.0000 mg | ORAL_CAPSULE | Freq: Four times a day (QID) | ORAL | Status: DC

## 2012-03-08 MED ORDER — CIPROFLOXACIN HCL 250 MG PO TABS: 250.0000 mg | ORAL_TABLET | Freq: Two times a day (BID) | ORAL | Status: AC

## 2012-03-08 MED ORDER — DULOXETINE HCL 20 MG PO CPEP: 20.0000 mg | ORAL_CAPSULE | Freq: Every day | ORAL | Status: DC

## 2012-03-08 MED ORDER — TRAZODONE HCL 100 MG PO TABS: 100.0000 mg | ORAL_TABLET | Freq: Every evening | ORAL | Status: DC | PRN

## 2012-03-08 MED ORDER — LEVOTHYROXINE SODIUM 137 MCG PO TABS: 137.0000 ug | ORAL_TABLET | ORAL | Status: DC

## 2012-03-08 MED ORDER — ESOMEPRAZOLE MAGNESIUM 40 MG PO CPDR: 40.0000 mg | DELAYED_RELEASE_CAPSULE | Freq: Two times a day (BID) | ORAL | Status: DC

## 2012-03-08 MED ORDER — METOCLOPRAMIDE HCL 10 MG PO TABS: 10.0000 mg | ORAL_TABLET | Freq: Four times a day (QID) | ORAL | Status: DC

## 2012-03-08 MED ORDER — ALBUTEROL SULFATE HFA 108 (90 BASE) MCG/ACT IN AERS: 2.0000 | INHALATION_SPRAY | Freq: Four times a day (QID) | RESPIRATORY_TRACT | Status: DC | PRN

## 2012-03-08 MED ORDER — NITROFURANTOIN MACROCRYSTAL 100 MG PO CAPS: 100.0000 mg | ORAL_CAPSULE | Freq: Every day | ORAL | Status: DC

## 2012-03-08 MED ORDER — INSULIN GLARGINE 100 UNIT/ML ~~LOC~~ SOLN: 14.0000 [IU] | Freq: Two times a day (BID) | SUBCUTANEOUS | Status: DC

## 2012-03-08 NOTE — Discharge Summary (Signed)
Physician Discharge Summary Note  Patient:  [name] is an 51 y.o., female MRN:  [id number] DOB:  [birth year] Patient phone:  [phone] (home)  Patient address:   [address] [place] [address],   Date of Admission:  03/02/2012 Date of Discharge: 03/08/12  Reason for Admission: Suicidal ideations  Discharge Diagnoses: Principal Problem:  *Cocaine dependence Active Problems:  Bereavement due to life event   Axis Diagnosis:   AXIS I:  Cocaine dependence, bereavement due to life event. AXIS II:  Deferred AXIS III:   Past Medical History  Diagnosis Date  . Hypothyroid   . Anxiety   . Diabetes mellitus     insulin  . COPD (chronic obstructive pulmonary disease)   . GERD (gastroesophageal reflux disease)   . PUD (peptic ulcer disease)     ? remote past, no reports from [facility] or [facility], states possibly Dr. Karilyn Cota.   . Hiatal hernia    AXIS IV:  other psychosocial or environmental problems AXIS V:  67  Level of Care:  OP  Hospital Course:  History of Present Illness: This is a 51 year old Caucasian female, admitted to [facility] from the [facility] ED with complaints of suicidal ideations. Patient reports, "I am having suicidal thoughts, that is why I went to the ED. It's been going on for a while, it just that the thoughts got worst and stronger. I found my best-friend dead last 03-04-2023 morning. He had a massive heart attack. I was shocked and distraught. That is why my depression got worse. I was having crying spells, bad thoughts and hopelessness. I have been depressed since 1993. I don't know what triggered my depression in the first place. I had been to this hospital once in the past for the depression. I had also gone to the [facility] and [facility] as well. I am currently on Lexapro 10 mg daily, Trazodone 100 mg at night and Xanax 1 mg qid. I am diabetic and I see Dr. Felecia Shelling in [place], [place] for it. I also go to [facility] for my  depression. I have been thinking about may be take a lot of pills one of these days and call life a quit. I am tired of feeling this way. I have been using and doing cocaine to cope with my bad emotional pain".   While a patient in this hospital, [name] received medication management for her depressive symptoms. She was prescribed and took Lexapro 20 mg daily for depression, Neurontin 100 mg for anxiety, Cymbalta 20 mg daily for depression, and Trazodone 100 mg Q bedtime for sleep. She was also enrolled in group counseling sessions and activities. She participated actively as recommended.  [name] also received medication management and monitoring for her other medical conditions. She tolerated her treatment regimen without any significant adverse effects and or reactions. Patient did respond appropriately to her treatment regimen. This is evidenced by her daily reports of, reduction of symptoms, presentation of good affect and improved mood.  Patient attended treatment team meeting this am and met with the team members. Her symptoms, treatment plan and response to treatment discussed. Patient endorsed that she is doing well and presentated that she is stable for discharge. She will continue psychiatric care on outpatient basis to maintain stability. She will follow-up at [facility] in [place] on 03/09/12. She will be meeting with [facility]. The address, date and time for this appointment provided for patient in writing. Patient also was  provided with 2 weeks worth samples of her discharge medications. Upon discharge, patient adamantly denies suicidal, homicidal ideations, auditory, visual hallucinations and or delusional thinking. [facility] belongings via family transport in no apparent distress.   Consults:  None  Significant Diagnostic Studies:  labs: CBC with diff, CMP  Discharge Vitals:   Blood pressure 91/61, pulse 80, temperature 97.5 F (36.4 C), temperature  source Oral, resp. rate 16, height 5\' 5"  (1.651 m), weight 75.751 kg (167 lb), SpO2 98.00%.  Mental Status Exam: See Mental Status Examination and Suicide Risk Assessment completed by Attending Physician prior to discharge.  Discharge destination:  Home  Is patient on multiple antipsychotic therapies at discharge:  No   Has Patient had three or more failed trials of antipsychotic monotherapy by history:  No  Recommended Plan for Multiple Antipsychotic Therapies: Na   Medication List  As of 03/09/2012  9:11 AM   STOP taking these medications         ALPRAZolam 1 MG tablet      promethazine 25 MG tablet         TAKE these medications      Indication    albuterol 108 (90 BASE) MCG/ACT inhaler   Commonly known as: PROVENTIL HFA;VENTOLIN HFA   Inhale 2 puffs into the lungs every 6 (six) hours as needed. For shortness of breath       ciprofloxacin 250 MG tablet   Commonly known as: CIPRO   Take 1 tablet (250 mg total) by mouth 2 (two) times daily. For urinary tract infections.       DULoxetine 20 MG capsule   Commonly known as: CYMBALTA   Take 1 capsule (20 mg total) by mouth daily. For depression       escitalopram 10 MG tablet   Commonly known as: LEXAPRO   Take 1 tablet (10 mg total) by mouth daily. For depression       esomeprazole 40 MG capsule   Commonly known as: NEXIUM   Take 1 capsule (40 mg total) by mouth 2 (two) times daily before a meal. For acid reflux       gabapentin 100 MG capsule   Commonly known as: NEURONTIN   Take 2 capsules (200 mg total) by mouth 4 (four) times daily. For anxiety       insulin aspart 100 UNIT/ML injection   Commonly known as: novoLOG   Inject 10-15 Units into the skin 3 (three) times daily before meals. For blood sugar control       insulin glargine 100 UNIT/ML injection   Commonly known as: LANTUS   Inject 14 Units into the skin 2 (two) times daily. 14 units in am and ) For blood sugar control  14 units in pm        )        levothyroxine 137 MCG tablet   Commonly known as: SYNTHROID, LEVOTHROID   Take 1 tablet (137 mcg total) by mouth every morning. For thyroid hormone replacement       metoCLOPramide 10 MG tablet   Commonly known as: REGLAN   Take 1 tablet (10 mg total) by mouth 4 (four) times daily. For Nausea       nitrofurantoin 100 MG capsule   Commonly known as: MACRODANTIN   Take 1 capsule (100 mg total) by mouth at bedtime. For chronic urinary tract infections       traZODone 100 MG tablet   Commonly known as: DESYREL  Take 1 tablet (100 mg total) by mouth at bedtime as needed for sleep. For sleep              Follow-up recommendations:  Activity:  as tolerated Other:  Keep all your scheduled follow-up appointments.  Comments:  Take your medications as prescribed. Report to your outpatient provider any adverse effects from medications. You must avoid using alcohol and or illegal drugs while taking prescription medicines. In the event of worsening symptoms, patient instructed to call the crisis hot line, 911 and or go to the nearest ED. Follow-up with your primary care physician for your other health issues.   SignedArmandina Stammer I 03/09/2012, 9:11 AM

## 2012-03-08 NOTE — Tx Team (Signed)
Interdisciplinary Treatment Plan Update (Adult)  Date:  03/08/2012  Time Reviewed:  10:04 AM   Progress in Treatment: Attending groups: No, has not attended groups over weekend  Participating in groups:  No Taking medication as prescribed:  Yes Tolerating medication: Yes Family/Significant othe contact made:  Yes, contact made with: son, [name] Patient understands diagnosis: Yes Discussing patient identified problems/goals with staff:  Yes Medical problems stabilized or resolved: No, blood pressure is low today Denies suicidal/homicidal ideation: Yes Issues/concerns per patient self-inventory:  No  Other:  New problem(s) identified: None  Reason for Continuation of Hospitalization: Medical Issues  Interventions implemented related to continuation of hospitalization:  Medication stabilization, safety checks q 15 mins, group attendance  Additional comments:  Estimated length of stay: 1 day  Discharge Plan: referred to [facility], if not will follow up with [facility]  New goal(s):  Review of initial/current patient goals per problem list:   1. Goal(s): Reduce potential for suicide/self-harm  Met: Yes Target date: by discharge  As evidenced by: [name] is not feeling suicidal today   2. Goal (s): Decrease depressive symptom rating to 4 ore less  Met: Yes Target date: by discharge  As evidenced by: [name] rates depression at 1   3. Goal(s): Address substance abuse problems  Met: No Target date: by discharge  As evidenced by: [name] has not been attending 12 step groups, but has been referred for further treatment   4. Goal(s): Medication stabilization  Met: Yes Target date: by discharge  As evidenced by: [name] has reported that psychiatric medications have helped her to decrease symptoms without intolerable side effects, but medications for physical symptoms to be adjusted due to low blood pressure  Attendees: Patient:     Family:     Physician:  Dr Orson Aloe, MD 03/08/2012 10:04 AM  Nursing:   Alease Frame, RN 03/08/2012 10:04 AM  Case Manager:  Juline Patch, LCSW 03/08/2012 10:04 AM  Counselor:  Angus Palms, LCSW 03/08/2012 10:04 AM  Other:  Reyes Ivan, LCSWA 03/08/2012 10:04 AM  Other:  Rodman Key, RN 03/08/2012 10:04 AM  Other:  Corinne Ports 03/08/2012  10:13 AM  Other:      Scribe for Treatment Team:   Billie Lade, 03/08/2012 10:04 AM
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
## 2012-03-08 NOTE — Telephone Encounter (Signed)
PCP is aware and I also mailed letter to patient to not neglect her health

## 2012-03-08 NOTE — Progress Notes (Signed)
[facility] MD Progress Note  03/08/2012 3:32 PM  Diagnosis:   Axis I: Bereavement, Substance Induced Mood Disorder and Cocaine Dependence Axis III:  Past Medical History  Diagnosis Date  . Hypothyroid   . Anxiety   . Diabetes mellitus     insulin  . COPD (chronic obstructive pulmonary disease)   . GERD (gastroesophageal reflux disease)   . PUD (peptic ulcer disease)     ? remote past, no reports from [place] or [facility], states possibly Dr. Karilyn Cota.   . Hiatal hernia     ADL's:  Intact  Sleep: Fair,   Appetite:  Fair  Suicidal Ideation:  Pt denies suicidal ideation Homicidal Ideation:  Denies adamantly any homicidal thoughts.  Mental Status Examination/Evaluation: Objective:  Appearance: Casual  Eye Contact::  Good  Speech:  Clear and Coherent  Volume:  Normal  Mood:  Dysphoric  Affect:  Congruent  Thought Process:  Coherent  Orientation:  Full  Thought Content:  WDL  Suicidal Thoughts:  No  Homicidal Thoughts:  No  Memory:  Immediate;   Fair  Judgement:  Fair  Insight:  Fair  Psychomotor Activity:  Normal  Concentration:  Fair  Recall:  Fair  Akathisia:  No  AIMS (if indicated):     Assets:  Communication Skills Desire for Improvement  Sleep:  Number of Hours: 6.25    ROS: Neuro: dizziness, headaches, and ataxia are a problem today.  Will adjust the Neurontin and add Cymbalta to help with that.  GI: Zopfran helps her nausea.  Her insurance doesn't pay for it.  That is why she is on the promethezine.  Constipation is better.  MS: slight muscle cramps and aches today.  Vital Signs:Blood pressure 91/61, pulse 80, temperature 97.5 F (36.4 C), temperature source Oral, resp. rate 16, height 5\' 5"  (1.651 m), weight 75.751 kg (167 lb), SpO2 98.00%. Current Medications: Current Facility-Administered Medications  Medication Dose Route Frequency Provider Last Rate Last Dose  . acetaminophen (TYLENOL) tablet 650 mg  650 mg Oral Q6H PRN Ronny Bacon, MD   650 mg at  03/08/12 0614  . albuterol (PROVENTIL HFA;VENTOLIN HFA) 108 (90 BASE) MCG/ACT inhaler 2 puff  2 puff Inhalation Q6H PRN Sanjuana Kava, NP      . alum & mag hydroxide-simeth (MAALOX/MYLANTA) 200-200-20 MG/5ML suspension 30 mL  30 mL Oral Q4H PRN Curlene Labrum Readling, MD   30 mL at 03/03/12 2138  . ciprofloxacin (CIPRO) tablet 250 mg  250 mg Oral BID [name] Adams, PA   250 mg at 03/08/12 0846  . DULoxetine (CYMBALTA) DR capsule 20 mg  20 mg Oral Daily Mike Craze, MD   20 mg at 03/08/12 0846  . escitalopram (LEXAPRO) tablet 10 mg  10 mg Oral Daily Sanjuana Kava, NP   10 mg at 03/08/12 0846  . gabapentin (NEURONTIN) capsule 200 mg  200 mg Oral QID Mike Craze, MD   200 mg at 03/08/12 1207  . insulin aspart (novoLOG) injection 0-15 Units  0-15 Units Subcutaneous TID WC Sanjuana Kava, NP   2 Units at 03/08/12 1209  . insulin aspart (novoLOG) injection 4 Units  4 Units Subcutaneous TID WC Sanjuana Kava, NP   4 Units at 03/08/12 1208  . insulin glargine (LANTUS) injection 14 Units  14 Units Subcutaneous BID Sanjuana Kava, NP   14 Units at 03/08/12 0946  . levothyroxine (SYNTHROID, LEVOTHROID) tablet 137 mcg  137 mcg Oral BH-q7a Sanjuana Kava, NP  137 mcg at 03/08/12 0614  . magnesium hydroxide (MILK OF MAGNESIA) suspension 30 mL  30 mL Oral Daily PRN Curlene Labrum Readling, MD   30 mL at 03/07/12 2157  . metoCLOPramide (REGLAN) tablet 10 mg  10 mg Oral QID Sanjuana Kava, NP   10 mg at 03/08/12 1207  . nicotine (NICODERM CQ - dosed in mg/24 hours) patch 21 mg  21 mg Transdermal Daily Mike Craze, MD   21 mg at 03/08/12 4098  . nitrofurantoin (macrocrystal-monohydrate) (MACROBID) capsule 100 mg  100 mg Oral QHS Mike Craze, MD   100 mg at 03/07/12 2156  . ondansetron (ZOFRAN) tablet 4 mg  4 mg Oral Q8H PRN Mike Craze, MD   4 mg at 03/08/12 0953  . pantoprazole (PROTONIX) EC tablet 80 mg  80 mg Oral BID AC Wonda Cerise, MD   80 mg at 03/08/12 1191  . traZODone (DESYREL) tablet 100 mg  100 mg Oral QHS  PRN Curlene Labrum Readling, MD   100 mg at 03/03/12 0036  . DISCONTD: traZODone (DESYREL) tablet 150 mg  150 mg Oral QHS Mike Craze, MD   150 mg at 03/07/12 2017    Lab Results:  Results for orders placed during the hospital encounter of 03/02/12 (from the past 48 hour(s))  GLUCOSE, CAPILLARY     Status: Normal   Collection Time   03/06/12  5:03 PM      Component Value Range Comment   Glucose-Capillary 97  70 - 99 mg/dL    Comment 1 Documented in Chart      Comment 2 Notify RN     COMPREHENSIVE METABOLIC PANEL     Status: Abnormal   Collection Time   03/06/12  7:29 PM      Component Value Range Comment   Sodium 135  135 - 145 mEq/L    Potassium 4.5  3.5 - 5.1 mEq/L    Chloride 96  96 - 112 mEq/L    CO2 25  19 - 32 mEq/L    Glucose, Bld 245 (*) 70 - 99 mg/dL    BUN 21  6 - 23 mg/dL    Creatinine, Ser 4.78  0.50 - 1.10 mg/dL    Calcium 9.6  8.4 - 29.5 mg/dL    Total Protein 7.4  6.0 - 8.3 g/dL    Albumin 3.8  3.5 - 5.2 g/dL    AST 15  0 - 37 U/L    ALT 10  0 - 35 U/L    Alkaline Phosphatase 116  39 - 117 U/L    Total Bilirubin 0.1 (*) 0.3 - 1.2 mg/dL    GFR calc non Af Amer 76 (*) >90 mL/min    GFR calc Af Amer 88 (*) >90 mL/min   URINALYSIS, WITH MICROSCOPIC     Status: Abnormal   Collection Time   03/06/12  7:30 PM      Component Value Range Comment   Color, Urine YELLOW  YELLOW    APPearance CLEAR  CLEAR    Specific Gravity, Urine 1.016  1.005 - 1.030    pH 6.0  5.0 - 8.0    Glucose, UA 250 (*) NEGATIVE mg/dL    Hgb urine dipstick NEGATIVE  NEGATIVE    Bilirubin Urine NEGATIVE  NEGATIVE    Ketones, ur NEGATIVE  NEGATIVE mg/dL    Protein, ur NEGATIVE  NEGATIVE mg/dL    Urobilinogen, UA 0.2  0.0 - 1.0 mg/dL  Nitrite NEGATIVE  NEGATIVE    Leukocytes, UA NEGATIVE  NEGATIVE    Squamous Epithelial / LPF RARE  RARE   GLUCOSE, CAPILLARY     Status: Abnormal   Collection Time   03/06/12 10:19 PM      Component Value Range Comment   Glucose-Capillary 354 (*) 70 - 99 mg/dL     GLUCOSE, CAPILLARY     Status: Abnormal   Collection Time   03/07/12  6:09 AM      Component Value Range Comment   Glucose-Capillary 112 (*) 70 - 99 mg/dL   GLUCOSE, CAPILLARY     Status: Abnormal   Collection Time   03/07/12 11:50 AM      Component Value Range Comment   Glucose-Capillary 111 (*) 70 - 99 mg/dL   GLUCOSE, CAPILLARY     Status: Abnormal   Collection Time   03/07/12  4:54 PM      Component Value Range Comment   Glucose-Capillary 234 (*) 70 - 99 mg/dL    Comment 1 Notify RN      Comment 2 Documented in Chart     GLUCOSE, CAPILLARY     Status: Abnormal   Collection Time   03/07/12  9:07 PM      Component Value Range Comment   Glucose-Capillary 253 (*) 70 - 99 mg/dL    Comment 1 Notify RN     GLUCOSE, CAPILLARY     Status: Abnormal   Collection Time   03/08/12  6:17 AM      Component Value Range Comment   Glucose-Capillary 67 (*) 70 - 99 mg/dL   GLUCOSE, CAPILLARY     Status: Abnormal   Collection Time   03/08/12 11:48 AM      Component Value Range Comment   Glucose-Capillary 149 (*) 70 - 99 mg/dL     Physical Findings: AIMS:  , ,  ,  ,    CIWA:  CIWA-Ar Total: 0  COWS:  COWS Total Score: 2   Treatment Plan Summary: Daily contact with patient to assess and evaluate symptoms and progress in treatment Medication management No signs/symptoms of withdrawal and mood/anxiety less than 3/10 where 1 is teh best and 10 is the worst  Plan: Pt has tended to lay around all weekend.  Her BP is low because of that.  She says that she doesn't feell good, but it is because she has been laying around.  She is drinking gatoraid now and is feeling a little better.  She has decided that she will just wait for her place at [facility] at home or with a friend.  She has decided that she will leave today to achive that end.  D/C today.  Her resolve to rehab may be lass than optimal for her to get off abusable substances.  Sylar Voong 03/08/2012, 3:32 PM

## 2012-03-08 NOTE — Progress Notes (Signed)
Nursing D/C note: D: patient discharged home per MD order.  Patient's discharged instructions reviewed and signed.  She was given her sample medications and prescriptions.  A: patient was cooperative and pleasant.  She denies any SI/HI/AVH.  R: patient left ambulatory with ride.

## 2012-03-08 NOTE — Progress Notes (Signed)
Patient scheduled with [facility] in [place] on Thursday, June 20th @ 7:45 am per case manager  [employer].   Note written by Minta Balsam for [facility] Nurse Sec/MHT

## 2012-03-08 NOTE — Telephone Encounter (Signed)
Pt was a no show

## 2012-03-08 NOTE — Telephone Encounter (Signed)
Please make sure referring MD aware of no show. 
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
## 2012-03-09 NOTE — Discharge Summary (Signed)
I agree with this D/C Summary.  

## 2012-04-22 DIAGNOSIS — E039 Hypothyroidism, unspecified: Secondary | ICD-10-CM | POA: Diagnosis not present

## 2012-04-22 DIAGNOSIS — E119 Type 2 diabetes mellitus without complications: Secondary | ICD-10-CM | POA: Diagnosis not present

## 2012-04-22 DIAGNOSIS — I10 Essential (primary) hypertension: Secondary | ICD-10-CM | POA: Diagnosis not present

## 2012-04-22 DIAGNOSIS — J410 Simple chronic bronchitis: Secondary | ICD-10-CM | POA: Diagnosis not present

## 2012-09-09 DIAGNOSIS — M549 Dorsalgia, unspecified: Secondary | ICD-10-CM | POA: Diagnosis not present

## 2012-09-09 DIAGNOSIS — L0291 Cutaneous abscess, unspecified: Secondary | ICD-10-CM | POA: Diagnosis not present

## 2012-09-09 DIAGNOSIS — E119 Type 2 diabetes mellitus without complications: Secondary | ICD-10-CM | POA: Diagnosis not present

## 2012-09-09 DIAGNOSIS — L0390 Cellulitis, unspecified: Secondary | ICD-10-CM | POA: Diagnosis not present

## 2012-09-29 ENCOUNTER — Encounter: Payer: Self-pay | Admitting: Internal Medicine

## 2012-09-30 ENCOUNTER — Telehealth: Payer: Self-pay | Admitting: Urgent Care

## 2012-09-30 ENCOUNTER — Ambulatory Visit: Payer: Self-pay | Admitting: Urgent Care

## 2012-09-30 NOTE — Telephone Encounter (Signed)
Pt was a no show

## 2012-09-30 NOTE — Telephone Encounter (Signed)
noted 

## 2012-12-14 DIAGNOSIS — E119 Type 2 diabetes mellitus without complications: Secondary | ICD-10-CM | POA: Diagnosis not present

## 2012-12-14 DIAGNOSIS — I10 Essential (primary) hypertension: Secondary | ICD-10-CM | POA: Diagnosis not present

## 2012-12-14 DIAGNOSIS — F339 Major depressive disorder, recurrent, unspecified: Secondary | ICD-10-CM | POA: Diagnosis not present

## 2012-12-14 DIAGNOSIS — E039 Hypothyroidism, unspecified: Secondary | ICD-10-CM | POA: Diagnosis not present

## 2013-01-03 DIAGNOSIS — M109 Gout, unspecified: Secondary | ICD-10-CM | POA: Diagnosis not present

## 2013-01-03 DIAGNOSIS — M130 Polyarthritis, unspecified: Secondary | ICD-10-CM | POA: Diagnosis not present

## 2013-01-03 DIAGNOSIS — E119 Type 2 diabetes mellitus without complications: Secondary | ICD-10-CM | POA: Diagnosis not present

## 2013-01-15 DIAGNOSIS — M130 Polyarthritis, unspecified: Secondary | ICD-10-CM | POA: Diagnosis not present

## 2013-01-15 DIAGNOSIS — E109 Type 1 diabetes mellitus without complications: Secondary | ICD-10-CM | POA: Diagnosis not present

## 2013-01-15 DIAGNOSIS — M109 Gout, unspecified: Secondary | ICD-10-CM | POA: Diagnosis not present

## 2013-01-19 DIAGNOSIS — R634 Abnormal weight loss: Secondary | ICD-10-CM | POA: Diagnosis not present

## 2013-01-19 DIAGNOSIS — M130 Polyarthritis, unspecified: Secondary | ICD-10-CM | POA: Diagnosis not present

## 2013-01-19 DIAGNOSIS — E119 Type 2 diabetes mellitus without complications: Secondary | ICD-10-CM | POA: Diagnosis not present

## 2013-01-19 DIAGNOSIS — F17200 Nicotine dependence, unspecified, uncomplicated: Secondary | ICD-10-CM | POA: Diagnosis not present

## 2013-01-24 ENCOUNTER — Other Ambulatory Visit: Payer: Self-pay | Admitting: Urgent Care

## 2013-03-02 ENCOUNTER — Emergency Department (HOSPITAL_COMMUNITY): Payer: Medicare Other

## 2013-03-02 ENCOUNTER — Emergency Department (HOSPITAL_COMMUNITY)
Admission: EM | Admit: 2013-03-02 | Discharge: 2013-03-02 | Disposition: A | Discharge status: Home / Self Care | Payer: Medicare Other | Attending: Emergency Medicine | Admitting: Emergency Medicine

## 2013-03-02 ENCOUNTER — Encounter (HOSPITAL_COMMUNITY): Payer: Self-pay | Admitting: Emergency Medicine

## 2013-03-02 DIAGNOSIS — J3489 Other specified disorders of nose and nasal sinuses: Secondary | ICD-10-CM | POA: Insufficient documentation

## 2013-03-02 DIAGNOSIS — R197 Diarrhea, unspecified: Secondary | ICD-10-CM | POA: Diagnosis not present

## 2013-03-02 DIAGNOSIS — E039 Hypothyroidism, unspecified: Secondary | ICD-10-CM | POA: Diagnosis not present

## 2013-03-02 DIAGNOSIS — R5381 Other malaise: Secondary | ICD-10-CM | POA: Insufficient documentation

## 2013-03-02 DIAGNOSIS — IMO0002 Reserved for concepts with insufficient information to code with codable children: Secondary | ICD-10-CM | POA: Insufficient documentation

## 2013-03-02 DIAGNOSIS — J449 Chronic obstructive pulmonary disease, unspecified: Secondary | ICD-10-CM | POA: Insufficient documentation

## 2013-03-02 DIAGNOSIS — R05 Cough: Secondary | ICD-10-CM | POA: Insufficient documentation

## 2013-03-02 DIAGNOSIS — R52 Pain, unspecified: Secondary | ICD-10-CM | POA: Insufficient documentation

## 2013-03-02 DIAGNOSIS — R112 Nausea with vomiting, unspecified: Secondary | ICD-10-CM | POA: Insufficient documentation

## 2013-03-02 DIAGNOSIS — Z8711 Personal history of peptic ulcer disease: Secondary | ICD-10-CM | POA: Insufficient documentation

## 2013-03-02 DIAGNOSIS — R059 Cough, unspecified: Secondary | ICD-10-CM | POA: Insufficient documentation

## 2013-03-02 DIAGNOSIS — R6889 Other general symptoms and signs: Secondary | ICD-10-CM | POA: Insufficient documentation

## 2013-03-02 DIAGNOSIS — Z90710 Acquired absence of both cervix and uterus: Secondary | ICD-10-CM | POA: Insufficient documentation

## 2013-03-02 DIAGNOSIS — G8929 Other chronic pain: Secondary | ICD-10-CM | POA: Diagnosis not present

## 2013-03-02 DIAGNOSIS — K219 Gastro-esophageal reflux disease without esophagitis: Secondary | ICD-10-CM | POA: Diagnosis not present

## 2013-03-02 DIAGNOSIS — R0602 Shortness of breath: Secondary | ICD-10-CM | POA: Insufficient documentation

## 2013-03-02 DIAGNOSIS — R079 Chest pain, unspecified: Secondary | ICD-10-CM | POA: Diagnosis not present

## 2013-03-02 DIAGNOSIS — E119 Type 2 diabetes mellitus without complications: Secondary | ICD-10-CM | POA: Insufficient documentation

## 2013-03-02 DIAGNOSIS — F411 Generalized anxiety disorder: Secondary | ICD-10-CM | POA: Diagnosis not present

## 2013-03-02 DIAGNOSIS — Z794 Long term (current) use of insulin: Secondary | ICD-10-CM | POA: Diagnosis not present

## 2013-03-02 DIAGNOSIS — F17200 Nicotine dependence, unspecified, uncomplicated: Secondary | ICD-10-CM | POA: Insufficient documentation

## 2013-03-02 DIAGNOSIS — R1084 Generalized abdominal pain: Secondary | ICD-10-CM | POA: Insufficient documentation

## 2013-03-02 DIAGNOSIS — Z8719 Personal history of other diseases of the digestive system: Secondary | ICD-10-CM | POA: Diagnosis not present

## 2013-03-02 DIAGNOSIS — Z79899 Other long term (current) drug therapy: Secondary | ICD-10-CM | POA: Diagnosis not present

## 2013-03-02 DIAGNOSIS — J4489 Other specified chronic obstructive pulmonary disease: Secondary | ICD-10-CM | POA: Insufficient documentation

## 2013-03-02 HISTORY — DX: Other chronic pain: G89.29

## 2013-03-02 HISTORY — DX: Unspecified abdominal pain: R10.9

## 2013-03-02 LAB — URINALYSIS, ROUTINE W REFLEX MICROSCOPIC
Bilirubin Urine: NEGATIVE
Glucose, UA: 250 mg/dL — AB
Hgb urine dipstick: NEGATIVE
Ketones, ur: NEGATIVE mg/dL
Protein, ur: NEGATIVE mg/dL
pH: 6 (ref 5.0–8.0)

## 2013-03-02 LAB — CBC WITH DIFFERENTIAL/PLATELET
Basophils Absolute: 0 10*3/uL (ref 0.0–0.1)
Eosinophils Absolute: 0.1 10*3/uL (ref 0.0–0.7)
Eosinophils Relative: 1 % (ref 0–5)
Lymphocytes Relative: 27 % (ref 12–46)
Lymphs Abs: 1.6 10*3/uL (ref 0.7–4.0)
MCH: 30.1 pg (ref 26.0–34.0)
Neutrophils Relative %: 65 % (ref 43–77)
Platelets: 273 10*3/uL (ref 150–400)
RBC: 4.69 MIL/uL (ref 3.87–5.11)
RDW: 13.7 % (ref 11.5–15.5)
WBC: 5.9 10*3/uL (ref 4.0–10.5)

## 2013-03-02 LAB — COMPREHENSIVE METABOLIC PANEL
ALT: 9 U/L (ref 0–35)
AST: 17 U/L (ref 0–37)
Alkaline Phosphatase: 95 U/L (ref 39–117)
Calcium: 9.5 mg/dL (ref 8.4–10.5)
GFR calc Af Amer: >90 mL/min (ref >90)
Glucose, Bld: 159 mg/dL — ABNORMAL HIGH (ref 70–99)
Potassium: 3.2 mEq/L — ABNORMAL LOW (ref 3.5–5.1)
Sodium: 136 mEq/L (ref 135–145)
Total Protein: 7.3 g/dL (ref 6.0–8.3)

## 2013-03-02 MED ORDER — FAMOTIDINE IN NACL 20-0.9 MG/50ML-% IV SOLN
20.0000 mg | Freq: Once | INTRAVENOUS | Status: AC
  Administered 2013-03-02: 20 mg via INTRAVENOUS
  Filled 2013-03-02 (×2): qty 50

## 2013-03-02 MED ORDER — KETOROLAC TROMETHAMINE 30 MG/ML IJ SOLN
15.0000 mg | Freq: Once | INTRAMUSCULAR | Status: AC
  Administered 2013-03-02: 15 mg via INTRAVENOUS
  Filled 2013-03-02: qty 1

## 2013-03-02 MED ORDER — ONDANSETRON HCL 4 MG PO TABS: 4.0000 mg | ORAL_TABLET | Freq: Three times a day (TID) | ORAL | Status: DC | PRN

## 2013-03-02 MED ORDER — SODIUM CHLORIDE 0.9 % IV SOLN
INTRAVENOUS | Status: DC
  Administered 2013-03-02: 500 mL via INTRAVENOUS

## 2013-03-02 MED ORDER — SODIUM CHLORIDE 0.9 % IV BOLUS (SEPSIS)
500.0000 mL | Freq: Once | INTRAVENOUS | Status: AC
  Administered 2013-03-02: 500 mL via INTRAVENOUS

## 2013-03-02 MED ORDER — POTASSIUM CHLORIDE 20 MEQ/15ML (10%) PO LIQD
40.0000 meq | Freq: Once | ORAL | Status: AC
  Administered 2013-03-02: 40 meq via ORAL
  Filled 2013-03-02: qty 30

## 2013-03-02 MED ORDER — ONDANSETRON HCL 4 MG/2ML IJ SOLN
4.0000 mg | INTRAMUSCULAR | Status: AC | PRN
  Administered 2013-03-02 (×2): 4 mg via INTRAVENOUS
  Filled 2013-03-02 (×2): qty 2

## 2013-03-02 NOTE — ED Provider Notes (Signed)
History     CSN: [id number]  Arrival date & time 03/02/13  1751   First MD Initiated Contact with Patient 03/02/13 1935      Chief Complaint  Patient presents with  . Chest Pain  . Shortness of Breath  . Cough  . Emesis  . Diarrhea     HPI Pt was seen at 1950.   Per pt, c/o gradual onset and persistence of constant generalized abd "pain" for the past 2 days.  Has been associated with multiple intermittent episodes of N/V/D, chest "pain," coughing, runny/stuffy nose, sinus congestion and generalized body aches/fatigue.  Describes the abd pain as "cramping." Describes her symptoms as "my whole body hurts." Denies sore throat, no fevers, no flank pain, no rash, no SOB, no black or blood in stools or emesis.       Past Medical History  Diagnosis Date  . Hypothyroid   . Anxiety   . Diabetes mellitus     insulin  . COPD (chronic obstructive pulmonary disease)   . GERD (gastroesophageal reflux disease)   . PUD (peptic ulcer disease)     ? remote past, no reports from [facility] or [facility], states possibly Dr. Karilyn Cota.   . Hiatal hernia   . Chronic abdominal pain     Past Surgical History  Procedure Laterality Date  . Abdominal hysterectomy    . Tonsillectomy    . Appendectomy    . Bladder repair    . Esophagogastroduodenoscopy      in remote past, unclear who performed. No op notes from [facility] or [facility] (pt thought Dr. Karilyn Cota)  . Esophagogastroduodenoscopy  02/12/2012    RMR:  Normal esophagus status post dilation as described above. Small hiatal hernia. Abnormal gastric because of uncertain significance-status post biopsy    Family History  Problem Relation Age of Onset  . Liver cancer Mother     remission for 2 years  . Colon cancer Neg Hx     History  Substance Use Topics  . Smoking status: Current Every Day Smoker -- 0.50 packs/day for 15 years    Types: Cigarettes  . Smokeless tobacco: Not on file  . Alcohol Use: No      Review of Systems ROS: Statement: All  systems negative except as marked or noted in the HPI; Constitutional: Negative for fever and chills. +generalized body aches/fatigue.; ; Eyes: Negative for eye pain, redness and discharge. ; ; ENMT: Negative for ear pain, hoarseness, sore throat. +nasal congestion, sinus pressure. ; ; Cardiovascular: Negative for  palpitations, diaphoresis, and peripheral edema. ; ; Respiratory: +CP, cough. Negative for wheezing and stridor. ; ; Gastrointestinal: +N/V/D, abd pain. Negative for blood in stool, hematemesis, jaundice and rectal bleeding. . ; ; Genitourinary: Negative for dysuria, flank pain and hematuria. ; ; Musculoskeletal: Negative for back pain and neck pain. Negative for swelling and trauma.; ; Skin: Negative for pruritus, rash, abrasions, blisters, bruising and skin lesion.; ; Neuro: Negative for headache, lightheadedness and neck stiffness. Negative for weakness, altered level of consciousness , altered mental status, extremity weakness, paresthesias, involuntary movement, seizure and syncope.        Allergies  Phenytoin; Sulfonamide derivatives; and Tetracyclines & related  Home Medications   Current Outpatient Rx  Name  Route  Sig  Dispense  Refill  . albuterol (PROVENTIL HFA;VENTOLIN HFA) 108 (90 BASE) MCG/ACT inhaler   Inhalation   Inhale 2 puffs into the lungs every 6 (six) hours as needed. For shortness of breath         .  cholecalciferol (VITAMIN D) 1000 UNITS tablet   Oral   Take 1,000 Units by mouth daily.         . DULoxetine (CYMBALTA) 20 MG capsule   Oral   Take 1 capsule (20 mg total) by mouth daily. For depression   30 capsule   0   . escitalopram (LEXAPRO) 10 MG tablet   Oral   Take 10 mg by mouth daily.         Marland Kitchen esomeprazole (NEXIUM) 40 MG capsule   Oral   Take 1 capsule (40 mg total) by mouth 2 (two) times daily.   60 capsule   5   . gabapentin (NEURONTIN) 100 MG capsule   Oral   Take 2 capsules (200 mg total) by mouth 4 (four) times daily. For  anxiety   240 capsule   0   . insulin aspart (NOVOLOG) 100 UNIT/ML injection   Subcutaneous   Inject 10-15 Units into the skin 3 (three) times daily before meals. For blood sugar control   1 vial      . insulin glargine (LANTUS) 100 UNIT/ML injection   Subcutaneous   Inject 14 Units into the skin 2 (two) times daily. 14 units in am and ) For blood sugar control 14 units in pm        )   10 mL      . levothyroxine (SYNTHROID, LEVOTHROID) 137 MCG tablet   Oral   Take 1 tablet (137 mcg total) by mouth every morning. For thyroid hormone replacement         . predniSONE (DELTASONE) 10 MG tablet   Oral   Take 10 mg by mouth daily.         . traZODone (DESYREL) 100 MG tablet   Oral   Take 100 mg by mouth at bedtime.           BP 120/61  Pulse 67  Temp(Src) 97.1 F (36.2 C) (Oral)  Resp 20  Ht 5\' 5"  (1.651 m)  Wt 165 lb (74.844 kg)  BMI 27.46 kg/m2  SpO2 100%  Physical Exam 1957: Physical examination:  Nursing notes reviewed; Vital signs and O2 SAT reviewed;  Constitutional: Well developed, Well nourished, Well hydrated, In no acute distress; Head:  Normocephalic, atraumatic; Eyes: EOMI, PERRL, No scleral icterus; ENMT:  Mouth and pharynx normal, Mucous membranes moist; Neck: Supple, Full range of motion, No lymphadenopathy; Cardiovascular: Regular rate and rhythm, No murmur, rub, or gallop; Respiratory: Breath sounds clear & equal bilaterally, No rales, rhonchi, wheezes.  Speaking full sentences with ease, Normal respiratory effort/excursion; Chest: Nontender, Movement normal; Abdomen: Soft, +mild diffuse tenderness to palp. No rebound or guarding. Nondistended, Normal bowel sounds; Genitourinary: No CVA tenderness; Spine:  No midline CS, TS, LS tenderness.;; Extremities: Pulses normal, No tenderness, No edema, No calf edema or asymmetry.; Neuro: AA&Ox3, Major CN grossly intact.  Speech clear. No gross focal motor or sensory deficits in extremities.; Skin: Color normal,  Warm, Dry.   ED Course  Procedures    MDM  MDM Reviewed: previous chart, nursing note and vitals Reviewed previous: labs Interpretation: labs, ECG and x-ray    Date: 03/02/2013  Rate: 80  Rhythm: normal sinus rhythm  QRS Axis: normal  Intervals: normal  ST/T Wave abnormalities: normal  Conduction Disutrbances:none  Narrative Interpretation:   Old EKG Reviewed: unchanged; no significant changes from previous EKG dated 09/01/2011.  Results for orders placed during the hospital encounter of 03/02/13  URINALYSIS, ROUTINE W REFLEX MICROSCOPIC  Result Value Range   Color, Urine STRAW (*) YELLOW   APPearance CLEAR  CLEAR   Specific Gravity, Urine <1.005 (*) 1.005 - 1.030   pH 6.0  5.0 - 8.0   Glucose, UA 250 (*) NEGATIVE mg/dL   Hgb urine dipstick NEGATIVE  NEGATIVE   Bilirubin Urine NEGATIVE  NEGATIVE   Ketones, ur NEGATIVE  NEGATIVE mg/dL   Protein, ur NEGATIVE  NEGATIVE mg/dL   Urobilinogen, UA 0.2  0.0 - 1.0 mg/dL   Nitrite NEGATIVE  NEGATIVE   Leukocytes, UA NEGATIVE  NEGATIVE  CBC WITH DIFFERENTIAL      Result Value Range   WBC 5.9  4.0 - 10.5 K/uL   RBC 4.69  3.87 - 5.11 MIL/uL   Hemoglobin 14.1  12.0 - 15.0 g/dL   HCT 78.2  95.6 - 21.3 %   MCV 89.3  78.0 - 100.0 fL   MCH 30.1  26.0 - 34.0 pg   MCHC 33.7  30.0 - 36.0 g/dL   RDW 08.6  57.8 - 46.9 %   Platelets 273  150 - 400 K/uL   Neutrophils Relative % 65  43 - 77 %   Neutro Abs 3.8  1.7 - 7.7 K/uL   Lymphocytes Relative 27  12 - 46 %   Lymphs Abs 1.6  0.7 - 4.0 K/uL   Monocytes Relative 7  3 - 12 %   Monocytes Absolute 0.4  0.1 - 1.0 K/uL   Eosinophils Relative 1  0 - 5 %   Eosinophils Absolute 0.1  0.0 - 0.7 K/uL   Basophils Relative 0  0 - 1 %   Basophils Absolute 0.0  0.0 - 0.1 K/uL  COMPREHENSIVE METABOLIC PANEL      Result Value Range   Sodium 136  135 - 145 mEq/L   Potassium 3.2 (*) 3.5 - 5.1 mEq/L   Chloride 98  96 - 112 mEq/L   CO2 26  19 - 32 mEq/L   Glucose, Bld 159 (*) 70 - 99 mg/dL    BUN 7  6 - 23 mg/dL   Creatinine, Ser 6.29  0.50 - 1.10 mg/dL   Calcium 9.5  8.4 - 52.8 mg/dL   Total Protein 7.3  6.0 - 8.3 g/dL   Albumin 3.7  3.5 - 5.2 g/dL   AST 17  0 - 37 U/L   ALT 9  0 - 35 U/L   Alkaline Phosphatase 95  39 - 117 U/L   Total Bilirubin 0.2 (*) 0.3 - 1.2 mg/dL   GFR calc non Af Amer >90  >90 mL/min   GFR calc Af Amer >90  >90 mL/min  LIPASE, BLOOD      Result Value Range   Lipase 7 (*) 11 - 59 U/L  TROPONIN I      Result Value Range   Troponin I <0.30  <0.30 ng/mL   Dg Chest 2 View 03/02/2013   *RADIOLOGY REPORT*  Clinical Data:  Chest pain, cough and shortness of breath.  CHEST - 2 VIEW  Comparison: 09/01/2011  Findings: The heart size and mediastinal contours are within normal limits.  Both lungs are clear.  The visualized skeletal structures are unremarkable.  IMPRESSION: No active disease.   Original Report Authenticated By: Irish Lack, M.D.     2220:  Potassium repleted PO. Pt has tol PO well while in the ED without N/V.  No stooling while in the ED.  Abd benign, VSS. Doubt PE as cause for symptoms with  low risk [place].  Doubt ACS as cause for symptoms with normal troponin and unchanged EKG from previous after 2 days of constant symptoms. Pt wants to go home now. Dx and testing d/w pt.  Questions answered.  Verb understanding, agreeable to d/c home with outpt f/u.            Laray Anger, DO 03/05/13 [id number]

## 2013-03-02 NOTE — ED Notes (Signed)
Pt c/o cp/sob/cough/congestion/diarrhea x 2 days.

## 2013-03-04 LAB — URINE CULTURE
Colony Count: NO GROWTH
Culture: NO GROWTH

## 2013-08-04 DIAGNOSIS — R079 Chest pain, unspecified: Secondary | ICD-10-CM | POA: Diagnosis not present

## 2013-08-04 DIAGNOSIS — R112 Nausea with vomiting, unspecified: Secondary | ICD-10-CM | POA: Diagnosis not present

## 2013-08-04 DIAGNOSIS — E1110 Type 2 diabetes mellitus with ketoacidosis without coma: Secondary | ICD-10-CM | POA: Diagnosis not present

## 2013-08-04 DIAGNOSIS — E039 Hypothyroidism, unspecified: Secondary | ICD-10-CM | POA: Diagnosis not present

## 2013-08-05 DIAGNOSIS — R079 Chest pain, unspecified: Secondary | ICD-10-CM | POA: Diagnosis not present

## 2013-08-05 DIAGNOSIS — E1110 Type 2 diabetes mellitus with ketoacidosis without coma: Secondary | ICD-10-CM | POA: Diagnosis not present

## 2013-08-06 DIAGNOSIS — E039 Hypothyroidism, unspecified: Secondary | ICD-10-CM | POA: Diagnosis not present

## 2013-08-06 DIAGNOSIS — E119 Type 2 diabetes mellitus without complications: Secondary | ICD-10-CM | POA: Diagnosis not present

## 2013-08-06 DIAGNOSIS — E1110 Type 2 diabetes mellitus with ketoacidosis without coma: Secondary | ICD-10-CM | POA: Diagnosis not present

## 2013-08-06 DIAGNOSIS — R079 Chest pain, unspecified: Secondary | ICD-10-CM | POA: Diagnosis not present

## 2014-02-03 DIAGNOSIS — E119 Type 2 diabetes mellitus without complications: Secondary | ICD-10-CM | POA: Diagnosis not present

## 2014-02-03 DIAGNOSIS — E039 Hypothyroidism, unspecified: Secondary | ICD-10-CM | POA: Diagnosis not present

## 2014-02-03 DIAGNOSIS — I10 Essential (primary) hypertension: Secondary | ICD-10-CM | POA: Diagnosis not present

## 2014-02-19 ENCOUNTER — Emergency Department (HOSPITAL_COMMUNITY): Payer: Medicare Other

## 2014-02-19 ENCOUNTER — Encounter (HOSPITAL_COMMUNITY): Payer: Self-pay | Admitting: Emergency Medicine

## 2014-02-19 ENCOUNTER — Inpatient Hospital Stay (HOSPITAL_COMMUNITY)
Admission: EM | Admit: 2014-02-19 | Discharge: 2014-02-27 | DRG: 638 | Disposition: A | Discharge status: Home Health | Payer: Medicare Other | Attending: Internal Medicine | Admitting: Internal Medicine

## 2014-02-19 DIAGNOSIS — Z794 Long term (current) use of insulin: Secondary | ICD-10-CM | POA: Diagnosis not present

## 2014-02-19 DIAGNOSIS — E039 Hypothyroidism, unspecified: Secondary | ICD-10-CM | POA: Diagnosis present

## 2014-02-19 DIAGNOSIS — R079 Chest pain, unspecified: Secondary | ICD-10-CM

## 2014-02-19 DIAGNOSIS — J449 Chronic obstructive pulmonary disease, unspecified: Secondary | ICD-10-CM | POA: Diagnosis present

## 2014-02-19 DIAGNOSIS — K219 Gastro-esophageal reflux disease without esophagitis: Secondary | ICD-10-CM | POA: Diagnosis not present

## 2014-02-19 DIAGNOSIS — K5900 Constipation, unspecified: Secondary | ICD-10-CM | POA: Diagnosis present

## 2014-02-19 DIAGNOSIS — K920 Hematemesis: Secondary | ICD-10-CM | POA: Diagnosis present

## 2014-02-19 DIAGNOSIS — R7309 Other abnormal glucose: Secondary | ICD-10-CM | POA: Diagnosis not present

## 2014-02-19 DIAGNOSIS — R1013 Epigastric pain: Secondary | ICD-10-CM

## 2014-02-19 DIAGNOSIS — E1110 Type 2 diabetes mellitus with ketoacidosis without coma: Secondary | ICD-10-CM | POA: Diagnosis not present

## 2014-02-19 DIAGNOSIS — R109 Unspecified abdominal pain: Secondary | ICD-10-CM | POA: Diagnosis not present

## 2014-02-19 DIAGNOSIS — Z66 Do not resuscitate: Secondary | ICD-10-CM | POA: Diagnosis present

## 2014-02-19 DIAGNOSIS — K208 Other esophagitis without bleeding: Secondary | ICD-10-CM | POA: Diagnosis present

## 2014-02-19 DIAGNOSIS — K2210 Ulcer of esophagus without bleeding: Secondary | ICD-10-CM | POA: Diagnosis not present

## 2014-02-19 DIAGNOSIS — K2100 Gastro-esophageal reflux disease with esophagitis, without bleeding: Secondary | ICD-10-CM | POA: Diagnosis present

## 2014-02-19 DIAGNOSIS — R7301 Impaired fasting glucose: Secondary | ICD-10-CM | POA: Diagnosis not present

## 2014-02-19 DIAGNOSIS — B3781 Candidal esophagitis: Secondary | ICD-10-CM | POA: Diagnosis present

## 2014-02-19 DIAGNOSIS — G8929 Other chronic pain: Secondary | ICD-10-CM | POA: Diagnosis present

## 2014-02-19 DIAGNOSIS — R1310 Dysphagia, unspecified: Secondary | ICD-10-CM | POA: Diagnosis present

## 2014-02-19 DIAGNOSIS — E876 Hypokalemia: Secondary | ICD-10-CM | POA: Diagnosis present

## 2014-02-19 DIAGNOSIS — E1010 Type 1 diabetes mellitus with ketoacidosis without coma: Secondary | ICD-10-CM | POA: Diagnosis not present

## 2014-02-19 DIAGNOSIS — F411 Generalized anxiety disorder: Secondary | ICD-10-CM | POA: Diagnosis present

## 2014-02-19 DIAGNOSIS — E873 Alkalosis: Secondary | ICD-10-CM | POA: Diagnosis present

## 2014-02-19 DIAGNOSIS — Z800 Family history of malignant neoplasm of digestive organs: Secondary | ICD-10-CM | POA: Diagnosis not present

## 2014-02-19 DIAGNOSIS — IMO0002 Reserved for concepts with insufficient information to code with codable children: Secondary | ICD-10-CM | POA: Diagnosis not present

## 2014-02-19 DIAGNOSIS — F17200 Nicotine dependence, unspecified, uncomplicated: Secondary | ICD-10-CM | POA: Diagnosis present

## 2014-02-19 DIAGNOSIS — E1310 Other specified diabetes mellitus with ketoacidosis without coma: Secondary | ICD-10-CM | POA: Diagnosis not present

## 2014-02-19 DIAGNOSIS — K7689 Other specified diseases of liver: Secondary | ICD-10-CM | POA: Diagnosis present

## 2014-02-19 DIAGNOSIS — N390 Urinary tract infection, site not specified: Secondary | ICD-10-CM | POA: Diagnosis present

## 2014-02-19 DIAGNOSIS — R197 Diarrhea, unspecified: Secondary | ICD-10-CM

## 2014-02-19 DIAGNOSIS — R112 Nausea with vomiting, unspecified: Secondary | ICD-10-CM | POA: Diagnosis not present

## 2014-02-19 DIAGNOSIS — J4489 Other specified chronic obstructive pulmonary disease: Secondary | ICD-10-CM | POA: Diagnosis present

## 2014-02-19 LAB — COMPREHENSIVE METABOLIC PANEL
ALBUMIN: 4.2 g/dL (ref 3.5–5.2)
ALK PHOS: 138 U/L — AB (ref 39–117)
ALT: 8 U/L (ref 0–35)
AST: 10 U/L (ref 0–37)
BILIRUBIN TOTAL: 0.2 mg/dL — AB (ref 0.3–1.2)
BUN: 20 mg/dL (ref 6–23)
CHLORIDE: 91 meq/L — AB (ref 96–112)
CO2: 12 mEq/L — ABNORMAL LOW (ref 19–32)
Calcium: 10.4 mg/dL (ref 8.4–10.5)
Creatinine, Ser: 0.92 mg/dL (ref 0.50–1.10)
GFR calc Af Amer: 80 mL/min — ABNORMAL LOW (ref >90)
GFR calc non Af Amer: 69 mL/min — ABNORMAL LOW (ref >90)
Glucose, Bld: 515 mg/dL — ABNORMAL HIGH (ref 70–99)
POTASSIUM: 5 meq/L (ref 3.7–5.3)
Sodium: 136 mEq/L — ABNORMAL LOW (ref 137–147)
Total Protein: 8.6 g/dL — ABNORMAL HIGH (ref 6.0–8.3)

## 2014-02-19 LAB — CBG MONITORING, ED: Glucose-Capillary: 494 mg/dL — ABNORMAL HIGH (ref 70–99)

## 2014-02-19 LAB — CBC WITH DIFFERENTIAL/PLATELET
BASOS ABS: 0 10*3/uL (ref 0.0–0.1)
BASOS PCT: 0 % (ref 0–1)
Eosinophils Absolute: 0 10*3/uL (ref 0.0–0.7)
Eosinophils Relative: 0 % (ref 0–5)
HCT: 44.6 % (ref 36.0–46.0)
Hemoglobin: 15.4 g/dL — ABNORMAL HIGH (ref 12.0–15.0)
Lymphocytes Relative: 9 % — ABNORMAL LOW (ref 12–46)
Lymphs Abs: 1.2 10*3/uL (ref 0.7–4.0)
MCH: 31.1 pg (ref 26.0–34.0)
MCHC: 34.5 g/dL (ref 30.0–36.0)
MCV: 90.1 fL (ref 78.0–100.0)
Monocytes Absolute: 0.3 10*3/uL (ref 0.1–1.0)
Monocytes Relative: 2 % — ABNORMAL LOW (ref 3–12)
NEUTROS ABS: 11.2 10*3/uL — AB (ref 1.7–7.7)
NEUTROS PCT: 89 % — AB (ref 43–77)
PLATELETS: 365 10*3/uL (ref 150–400)
RBC: 4.95 MIL/uL (ref 3.87–5.11)
RDW: 13.1 % (ref 11.5–15.5)
WBC: 12.6 10*3/uL — ABNORMAL HIGH (ref 4.0–10.5)

## 2014-02-19 MED ORDER — DEXTROSE-NACL 5-0.45 % IV SOLN: INTRAVENOUS | Status: DC

## 2014-02-19 MED ORDER — SODIUM CHLORIDE 0.9 % IV SOLN
1000.0000 mL | Freq: Once | INTRAVENOUS | Status: AC
  Administered 2014-02-19: 1000 mL via INTRAVENOUS

## 2014-02-19 MED ORDER — INSULIN ASPART 100 UNIT/ML ~~LOC~~ SOLN
10.0000 [IU] | Freq: Once | SUBCUTANEOUS | Status: AC
  Administered 2014-02-19: 10 [IU] via INTRAVENOUS
  Filled 2014-02-19: qty 1

## 2014-02-19 MED ORDER — ONDANSETRON HCL 4 MG/2ML IJ SOLN
4.0000 mg | Freq: Once | INTRAMUSCULAR | Status: AC
  Administered 2014-02-19: 4 mg via INTRAVENOUS

## 2014-02-19 MED ORDER — SODIUM CHLORIDE 0.9 % IV BOLUS (SEPSIS)
1000.0000 mL | Freq: Once | INTRAVENOUS | Status: AC
  Administered 2014-02-19: 1000 mL via INTRAVENOUS

## 2014-02-19 MED ORDER — FENTANYL CITRATE 0.05 MG/ML IJ SOLN
INTRAMUSCULAR | Status: AC
  Administered 2014-02-19: 50 ug via INTRAVENOUS
  Filled 2014-02-19: qty 2

## 2014-02-19 MED ORDER — SODIUM CHLORIDE 0.9 % IV SOLN
INTRAVENOUS | Status: DC
  Administered 2014-02-20: 3.8 [IU]/h via INTRAVENOUS
  Filled 2014-02-19: qty 1

## 2014-02-19 MED ORDER — SODIUM CHLORIDE 0.9 % IV SOLN: INTRAVENOUS | Status: DC

## 2014-02-19 MED ORDER — SODIUM CHLORIDE 0.9 % IV SOLN
1000.0000 mL | INTRAVENOUS | Status: DC
  Administered 2014-02-20: 1000 mL via INTRAVENOUS

## 2014-02-19 MED ORDER — PANTOPRAZOLE SODIUM 40 MG IV SOLR
40.0000 mg | Freq: Once | INTRAVENOUS | Status: AC
Start: 2014-02-19 — End: 2014-02-19
  Administered 2014-02-19: 40 mg via INTRAVENOUS
  Filled 2014-02-19: qty 40

## 2014-02-19 MED ORDER — ONDANSETRON HCL 4 MG/2ML IJ SOLN
INTRAMUSCULAR | Status: AC
  Administered 2014-02-19: 4 mg via INTRAVENOUS
  Filled 2014-02-19: qty 2

## 2014-02-19 MED ORDER — FENTANYL CITRATE 0.05 MG/ML IJ SOLN
50.0000 ug | Freq: Once | INTRAMUSCULAR | Status: AC
  Administered 2014-02-19: 50 ug via INTRAVENOUS

## 2014-02-19 NOTE — ED Provider Notes (Addendum)
CSN: [id number]     Arrival date & time 02/19/14  2043 History  This chart was scribed for Rosita Fire, MD by Randa Evens, ED Scribe. This patient was seen in room [facility] and the patient's care was started at 12:44 AM.    Chief Complaint  Patient presents with  . Hematemesis   The history is provided by the patient. No language interpreter was used.    HPI Comments: VANITY LARSSON is a 53 y.o. female brought in by ambulance, who presents to the Emergency Department complaining of emesis that is coffee brownish in color onset Saturday. With associated nausea, hematemesis, abdominal pain, chest pain. She states that's she has vomited at least 20 times per day since intial onset. She states she has associated fever, chills, visual disturbance, cough, chest pain, sob, headache. She denies having diarrhea, cough, rhinorrhea, sore throat, dysuria, ankle swelling, back pain, neck pain, rash, or h/o bleeding easily.     Past Medical History  Diagnosis Date  . Hypothyroid   . Anxiety   . Diabetes mellitus     insulin  . COPD (chronic obstructive pulmonary disease)   . GERD (gastroesophageal reflux disease)   . PUD (peptic ulcer disease)     ? remote past, no reports from [facility] or [facility], states possibly Dr. Laural Golden.   . Hiatal hernia   . Chronic abdominal pain    Past Surgical History  Procedure Laterality Date  . Abdominal hysterectomy    . Tonsillectomy    . Appendectomy    . Bladder repair    . Esophagogastroduodenoscopy      in remote past, unclear who performed. No op notes from [facility] or [facility] (pt thought Dr. Laural Golden)  . Esophagogastroduodenoscopy  02/12/2012    RMR:  Normal esophagus status post dilation as described above. Small hiatal hernia. Abnormal gastric because of uncertain significance-status post biopsy   Family History  Problem Relation Age of Onset  . Liver cancer Mother     remission for 2 years  . Colon cancer Neg Hx    History  Substance Use  Topics  . Smoking status: Current Every Day Smoker -- 0.50 packs/day for 15 years    Types: Cigarettes  . Smokeless tobacco: Not on file  . Alcohol Use: No   OB History   Grav Para Term Preterm Abortions TAB SAB Ect Mult Living                 Review of Systems  Constitutional: Positive for fever and chills.  HENT: Negative for rhinorrhea and sore throat.   Eyes: Positive for visual disturbance.  Respiratory: Positive for cough and shortness of breath.   Cardiovascular: Positive for chest pain.  Genitourinary: Negative for dysuria.  Musculoskeletal: Negative for back pain and neck pain.  Skin: Negative for rash.  Neurological: Positive for headaches.  Psychiatric/Behavioral: Negative for confusion.      Allergies  Phenytoin; Sulfonamide derivatives; and Tetracyclines & related  Home Medications   Prior to Admission medications   Medication Sig Start Date End Date Taking? Authorizing Provider  albuterol (PROVENTIL HFA;VENTOLIN HFA) 108 (90 BASE) MCG/ACT inhaler Inhale 2 puffs into the lungs every 6 (six) hours as needed. For shortness of breath 03/08/12   Encarnacion Slates, NP  cholecalciferol (VITAMIN D) 1000 UNITS tablet Take 1,000 Units by mouth daily.    Historical Provider, MD  DULoxetine (CYMBALTA) 20 MG capsule Take 1 capsule (20 mg total) by mouth daily. For depression 03/08/12 03/08/13  Sanjuana Kava, NP  escitalopram (LEXAPRO) 10 MG tablet Take 10 mg by mouth daily.    Historical Provider, MD  esomeprazole (NEXIUM) 40 MG capsule Take 1 capsule (40 mg total) by mouth 2 (two) times daily. 01/24/13   Nira Retort, NP  gabapentin (NEURONTIN) 100 MG capsule Take 2 capsules (200 mg total) by mouth 4 (four) times daily. For anxiety 03/08/12 03/08/13  Sanjuana Kava, NP  insulin aspart (NOVOLOG) 100 UNIT/ML injection Inject 10-15 Units into the skin 3 (three) times daily before meals. For blood sugar control 03/08/12   Sanjuana Kava, NP  insulin glargine (LANTUS) 100 UNIT/ML injection  Inject 14 Units into the skin 2 (two) times daily. 14 units in am and ) For blood sugar control 14 units in pm        ) 03/08/12   Sanjuana Kava, NP  levothyroxine (SYNTHROID, LEVOTHROID) 137 MCG tablet Take 1 tablet (137 mcg total) by mouth every morning. For thyroid hormone replacement 03/08/12   Sanjuana Kava, NP  ondansetron (ZOFRAN) 4 MG tablet Take 1 tablet (4 mg total) by mouth every 8 (eight) hours as needed for nausea. 03/02/13   Laray Anger, DO  polyethylene glycol powder (GLYCOLAX/MIRALAX) powder Take 17 g by mouth daily. 02/03/14   Historical Provider, MD  predniSONE (DELTASONE) 10 MG tablet Take 10 mg by mouth daily.    Historical Provider, MD  promethazine (PHENERGAN) 25 MG tablet Take 25 mg by mouth 3 (three) times daily as needed. For nausea and/or vimiting 02/03/14   Historical Provider, MD  traMADol (ULTRAM) 50 MG tablet Take 50 mg by mouth 2 (two) times daily. 02/03/14   Historical Provider, MD  traZODone (DESYREL) 100 MG tablet Take 100 mg by mouth at bedtime.    Historical Provider, MD   Triage Vitals: BP 157/68  Pulse 93  Temp(Src) 98 F (36.7 C) (Oral)  Resp 20  Ht 5\' 5"  (1.651 m)  Wt 164 lb (74.39 kg)  BMI 27.29 kg/m2  SpO2 98%  Physical Exam  Nursing note and vitals reviewed. Constitutional: She is oriented to person, place, and time. She appears well-developed and well-nourished.  HENT:  Head: Normocephalic and atraumatic.  Eyes: EOM are normal.  Neck: Normal range of motion.  Cardiovascular: Normal rate and regular rhythm.   Pulmonary/Chest: Effort normal and breath sounds normal.  Abdominal: Soft. Bowel sounds are normal. There is tenderness. There is no guarding.  Epigastric tenderness  Musculoskeletal: Normal range of motion.  Neurological: She is alert and oriented to person, place, and time.  Skin: Skin is warm and dry.  Psychiatric: She has a normal mood and affect. Her behavior is normal.    ED Course  Procedures (including critical care  time) DIAGNOSTIC STUDIES: Oxygen Saturation is 98% on RA, normal by my interpretation.    COORDINATION OF CARE: 11:00 PM-Discussed treatment plan which includes CBC panel, and CMP, with pt at bedside and pt agreed to plan.   Results for orders placed during the hospital encounter of 02/19/14  CBC WITH DIFFERENTIAL      Result Value Ref Range   WBC 12.6 (*) 4.0 - 10.5 K/uL   RBC 4.95  3.87 - 5.11 MIL/uL   Hemoglobin 15.4 (*) 12.0 - 15.0 g/dL   HCT 95.6  21.3 - 08.6 %   MCV 90.1  78.0 - 100.0 fL   MCH 31.1  26.0 - 34.0 pg   MCHC 34.5  30.0 - 36.0 g/dL  RDW 13.1  11.5 - 15.5 %   Platelets 365  150 - 400 K/uL   Neutrophils Relative % 89 (*) 43 - 77 %   Neutro Abs 11.2 (*) 1.7 - 7.7 K/uL   Lymphocytes Relative 9 (*) 12 - 46 %   Lymphs Abs 1.2  0.7 - 4.0 K/uL   Monocytes Relative 2 (*) 3 - 12 %   Monocytes Absolute 0.3  0.1 - 1.0 K/uL   Eosinophils Relative 0  0 - 5 %   Eosinophils Absolute 0.0  0.0 - 0.7 K/uL   Basophils Relative 0  0 - 1 %   Basophils Absolute 0.0  0.0 - 0.1 K/uL  COMPREHENSIVE METABOLIC PANEL      Result Value Ref Range   Sodium 136 (*) 137 - 147 mEq/L   Potassium 5.0  3.7 - 5.3 mEq/L   Chloride 91 (*) 96 - 112 mEq/L   CO2 12 (*) 19 - 32 mEq/L   Glucose, Bld 515 (*) 70 - 99 mg/dL   BUN 20  6 - 23 mg/dL   Creatinine, Ser 0.92  0.50 - 1.10 mg/dL   Calcium 10.4  8.4 - 10.5 mg/dL   Total Protein 8.6 (*) 6.0 - 8.3 g/dL   Albumin 4.2  3.5 - 5.2 g/dL   AST 10  0 - 37 U/L   ALT 8  0 - 35 U/L   Alkaline Phosphatase 138 (*) 39 - 117 U/L   Total Bilirubin 0.2 (*) 0.3 - 1.2 mg/dL   GFR calc non Af Amer 69 (*) >90 mL/min   GFR calc Af Amer 80 (*) >90 mL/min  LIPASE, BLOOD      Result Value Ref Range   Lipase 58  11 - 59 U/L  CBG MONITORING, ED      Result Value Ref Range   Glucose-Capillary 494 (*) 70 - 99 mg/dL  TYPE AND SCREEN      Result Value Ref Range   ABO/RH(D) O NEG     Antibody Screen NEG     Sample Expiration 02/22/2014     No results  found.    Labs Review Labs Reviewed  CBC WITH DIFFERENTIAL - Abnormal; Notable for the following:    WBC 12.6 (*)    Hemoglobin 15.4 (*)    Neutrophils Relative % 89 (*)    Neutro Abs 11.2 (*)    Lymphocytes Relative 9 (*)    Monocytes Relative 2 (*)    All other components within normal limits  COMPREHENSIVE METABOLIC PANEL - Abnormal; Notable for the following:    Sodium 136 (*)    Chloride 91 (*)    CO2 12 (*)    Glucose, Bld 515 (*)    Total Protein 8.6 (*)    Alkaline Phosphatase 138 (*)    Total Bilirubin 0.2 (*)    GFR calc non Af Amer 69 (*)    GFR calc Af Amer 80 (*)    All other components within normal limits  CBG MONITORING, ED - Abnormal; Notable for the following:    Glucose-Capillary 494 (*)    All other components within normal limits  LIPASE, BLOOD  CBG MONITORING, ED  TYPE AND SCREEN   Results for orders placed during the hospital encounter of 02/19/14  CBC WITH DIFFERENTIAL      Result Value Ref Range   WBC 12.6 (*) 4.0 - 10.5 K/uL   RBC 4.95  3.87 - 5.11 MIL/uL   Hemoglobin 15.4 (*)  12.0 - 15.0 g/dL   HCT 44.6  36.0 - 46.0 %   MCV 90.1  78.0 - 100.0 fL   MCH 31.1  26.0 - 34.0 pg   MCHC 34.5  30.0 - 36.0 g/dL   RDW 13.1  11.5 - 15.5 %   Platelets 365  150 - 400 K/uL   Neutrophils Relative % 89 (*) 43 - 77 %   Neutro Abs 11.2 (*) 1.7 - 7.7 K/uL   Lymphocytes Relative 9 (*) 12 - 46 %   Lymphs Abs 1.2  0.7 - 4.0 K/uL   Monocytes Relative 2 (*) 3 - 12 %   Monocytes Absolute 0.3  0.1 - 1.0 K/uL   Eosinophils Relative 0  0 - 5 %   Eosinophils Absolute 0.0  0.0 - 0.7 K/uL   Basophils Relative 0  0 - 1 %   Basophils Absolute 0.0  0.0 - 0.1 K/uL  COMPREHENSIVE METABOLIC PANEL      Result Value Ref Range   Sodium 136 (*) 137 - 147 mEq/L   Potassium 5.0  3.7 - 5.3 mEq/L   Chloride 91 (*) 96 - 112 mEq/L   CO2 12 (*) 19 - 32 mEq/L   Glucose, Bld 515 (*) 70 - 99 mg/dL   BUN 20  6 - 23 mg/dL   Creatinine, Ser 0.92  0.50 - 1.10 mg/dL   Calcium 10.4   8.4 - 10.5 mg/dL   Total Protein 8.6 (*) 6.0 - 8.3 g/dL   Albumin 4.2  3.5 - 5.2 g/dL   AST 10  0 - 37 U/L   ALT 8  0 - 35 U/L   Alkaline Phosphatase 138 (*) 39 - 117 U/L   Total Bilirubin 0.2 (*) 0.3 - 1.2 mg/dL   GFR calc non Af Amer 69 (*) >90 mL/min   GFR calc Af Amer 80 (*) >90 mL/min  LIPASE, BLOOD      Result Value Ref Range   Lipase 58  11 - 59 U/L  CBG MONITORING, ED      Result Value Ref Range   Glucose-Capillary 494 (*) 70 - 99 mg/dL  TYPE AND SCREEN      Result Value Ref Range   ABO/RH(D) O NEG     Antibody Screen NEG     Sample Expiration 02/22/2014       Imaging Review No results found.   EKG Interpretation   Date/Time:  Monday February 20 2014 00:35:24 EDT Ventricular Rate:  111 PR Interval:  132 QRS Duration: 86 QT Interval:  308 QTC Calculation: 418 R Axis:   53 Text Interpretation:  Sinus tachycardia Nonspecific ST and T wave  abnormality Abnormal ECG Confirmed by Deonna Krummel  MD, Marivel Mcclarty ([id number]) on  02/20/2014 12:44:11 AM      CRITICAL CARE Performed by: Fredia Sorrow Total critical care time: 30 Critical care time was exclusive of separately billable procedures and treating other patients. Critical care was necessary to treat or prevent imminent or life-threatening deterioration. Critical care was time spent personally by me on the following activities: development of treatment plan with patient and/or surrogate as well as nursing, discussions with consultants, evaluation of patient's response to treatment, examination of patient, obtaining history from patient or surrogate, ordering and performing treatments and interventions, ordering and review of laboratory studies, ordering and review of radiographic studies, pulse oximetry and re-evaluation of patient's condition.       MDM   Final diagnoses:  Hematemesis  DKA (diabetic ketoacidoses)  Patient was markedly elevated blood sugar in the 494 range. Patient with acidosis consistent with  ketoacidosis. Patient also vomiting coffee ground emesis but no significant anemia or significant lab abnormalities. Patient started on glucose stabilizer for the high sugar. Patient received 10 units of regular insulin IV prior to that. Patient's lipase is not elevated patient liver function tests without significant amount of a mild elevation of the alkaline phosphatase at 138. Patient stated as no anemia. Will need to be monitored for the vomiting of coffee-ground emesis. Also we'll need further treatment for the DKA. Patient will require admission. Patient is a patient of Dr. Josephine Cables. Patient also received IV fluids. Patient's potassium is at 5.0 on the high end of normal. This is appropriate for the acidosis.    I personally performed the services described in this documentation, which was scribed in my presence. The recorded information has been reviewed and is accurate.       Fredia Sorrow, MD 02/20/14 [id number]  Fredia Sorrow, MD 02/20/14 [phone]

## 2014-02-19 NOTE — ED Notes (Signed)
Nausea and vomiting since Thursday, abd pain, chest wall pain.

## 2014-02-19 NOTE — ED Notes (Signed)
Pt states vomiting since yesterday, denies any dark or bloody stools.

## 2014-02-20 ENCOUNTER — Encounter (HOSPITAL_COMMUNITY): Payer: Self-pay | Admitting: *Deleted

## 2014-02-20 ENCOUNTER — Inpatient Hospital Stay (HOSPITAL_COMMUNITY): Payer: Medicare Other

## 2014-02-20 DIAGNOSIS — E1110 Type 2 diabetes mellitus with ketoacidosis without coma: Secondary | ICD-10-CM | POA: Diagnosis present

## 2014-02-20 DIAGNOSIS — K920 Hematemesis: Secondary | ICD-10-CM

## 2014-02-20 DIAGNOSIS — R112 Nausea with vomiting, unspecified: Secondary | ICD-10-CM

## 2014-02-20 DIAGNOSIS — K219 Gastro-esophageal reflux disease without esophagitis: Secondary | ICD-10-CM

## 2014-02-20 DIAGNOSIS — R197 Diarrhea, unspecified: Secondary | ICD-10-CM

## 2014-02-20 DIAGNOSIS — F17200 Nicotine dependence, unspecified, uncomplicated: Secondary | ICD-10-CM

## 2014-02-20 DIAGNOSIS — R079 Chest pain, unspecified: Secondary | ICD-10-CM

## 2014-02-20 LAB — GLUCOSE, CAPILLARY
GLUCOSE-CAPILLARY: 342 mg/dL — AB (ref 70–99)
GLUCOSE-CAPILLARY: 227 mg/dL — AB (ref 70–99)
GLUCOSE-CAPILLARY: 112 mg/dL — AB (ref 70–99)
GLUCOSE-CAPILLARY: 142 mg/dL — AB (ref 70–99)
GLUCOSE-CAPILLARY: 124 mg/dL — AB (ref 70–99)
GLUCOSE-CAPILLARY: 229 mg/dL — AB (ref 70–99)
Glucose-Capillary: 310 mg/dL — ABNORMAL HIGH (ref 70–99)
Glucose-Capillary: 290 mg/dL — ABNORMAL HIGH (ref 70–99)
Glucose-Capillary: 195 mg/dL — ABNORMAL HIGH (ref 70–99)
Glucose-Capillary: 165 mg/dL — ABNORMAL HIGH (ref 70–99)
Glucose-Capillary: 153 mg/dL — ABNORMAL HIGH (ref 70–99)
Glucose-Capillary: 151 mg/dL — ABNORMAL HIGH (ref 70–99)
Glucose-Capillary: 102 mg/dL — ABNORMAL HIGH (ref 70–99)
Glucose-Capillary: 84 mg/dL (ref 70–99)
Glucose-Capillary: 166 mg/dL — ABNORMAL HIGH (ref 70–99)
Glucose-Capillary: 155 mg/dL — ABNORMAL HIGH (ref 70–99)
Glucose-Capillary: 151 mg/dL — ABNORMAL HIGH (ref 70–99)
Glucose-Capillary: 125 mg/dL — ABNORMAL HIGH (ref 70–99)
Glucose-Capillary: 185 mg/dL — ABNORMAL HIGH (ref 70–99)
Glucose-Capillary: 279 mg/dL — ABNORMAL HIGH (ref 70–99)
Glucose-Capillary: 279 mg/dL — ABNORMAL HIGH (ref 70–99)
Glucose-Capillary: 156 mg/dL — ABNORMAL HIGH (ref 70–99)

## 2014-02-20 LAB — HEPATIC FUNCTION PANEL
ALK PHOS: 130 U/L — AB (ref 39–117)
ALT: 7 U/L (ref 0–35)
AST: 11 U/L (ref 0–37)
Albumin: 4.1 g/dL (ref 3.5–5.2)
TOTAL PROTEIN: 8.6 g/dL — AB (ref 6.0–8.3)
Total Bilirubin: <0.2 mg/dL — ABNORMAL LOW (ref 0.3–1.2)

## 2014-02-20 LAB — BASIC METABOLIC PANEL
BUN: 21 mg/dL (ref 6–23)
BUN: 21 mg/dL (ref 6–23)
BUN: 21 mg/dL (ref 6–23)
BUN: 20 mg/dL (ref 6–23)
BUN: 18 mg/dL (ref 6–23)
BUN: 14 mg/dL (ref 6–23)
BUN: 12 mg/dL (ref 6–23)
CALCIUM: 9.9 mg/dL (ref 8.4–10.5)
CALCIUM: 9.9 mg/dL (ref 8.4–10.5)
CHLORIDE: 101 meq/L (ref 96–112)
CHLORIDE: 98 meq/L (ref 96–112)
CO2: 10 meq/L — AB (ref 19–32)
CO2: 14 mEq/L — ABNORMAL LOW (ref 19–32)
CO2: 16 mEq/L — ABNORMAL LOW (ref 19–32)
CO2: 18 mEq/L — ABNORMAL LOW (ref 19–32)
CO2: 17 meq/L — AB (ref 19–32)
CO2: 19 mEq/L (ref 19–32)
CO2: 20 mEq/L (ref 19–32)
CREATININE: 0.89 mg/dL (ref 0.50–1.10)
CREATININE: 0.72 mg/dL (ref 0.50–1.10)
CREATININE: 0.72 mg/dL (ref 0.50–1.10)
Calcium: 9.9 mg/dL (ref 8.4–10.5)
Calcium: 9.7 mg/dL (ref 8.4–10.5)
Calcium: 9.3 mg/dL (ref 8.4–10.5)
Calcium: 9.3 mg/dL (ref 8.4–10.5)
Calcium: 9.4 mg/dL (ref 8.4–10.5)
Chloride: 99 mEq/L (ref 96–112)
Chloride: 101 mEq/L (ref 96–112)
Chloride: 104 mEq/L (ref 96–112)
Chloride: 102 mEq/L (ref 96–112)
Chloride: 98 mEq/L (ref 96–112)
Creatinine, Ser: 0.88 mg/dL (ref 0.50–1.10)
Creatinine, Ser: 0.68 mg/dL (ref 0.50–1.10)
Creatinine, Ser: 0.63 mg/dL (ref 0.50–1.10)
Creatinine, Ser: 0.67 mg/dL (ref 0.50–1.10)
GFR calc Af Amer: 84 mL/min — ABNORMAL LOW (ref >90)
GFR calc Af Amer: 85 mL/min — ABNORMAL LOW (ref >90)
GFR calc Af Amer: >90 mL/min (ref >90)
GFR calc non Af Amer: 72 mL/min — ABNORMAL LOW (ref >90)
GFR calc non Af Amer: 73 mL/min — ABNORMAL LOW (ref >90)
GFR calc non Af Amer: >90 mL/min (ref >90)
GFR calc non Af Amer: >90 mL/min (ref >90)
GFR calc non Af Amer: >90 mL/min (ref >90)
GFR calc non Af Amer: >90 mL/min (ref >90)
GLUCOSE: 399 mg/dL — AB (ref 70–99)
GLUCOSE: 312 mg/dL — AB (ref 70–99)
GLUCOSE: 195 mg/dL — AB (ref 70–99)
Glucose, Bld: 198 mg/dL — ABNORMAL HIGH (ref 70–99)
Glucose, Bld: 163 mg/dL — ABNORMAL HIGH (ref 70–99)
Glucose, Bld: 182 mg/dL — ABNORMAL HIGH (ref 70–99)
Glucose, Bld: 249 mg/dL — ABNORMAL HIGH (ref 70–99)
POTASSIUM: 3.4 meq/L — AB (ref 3.7–5.3)
POTASSIUM: 3.4 meq/L — AB (ref 3.7–5.3)
Potassium: 4.2 mEq/L (ref 3.7–5.3)
Potassium: 4.4 mEq/L (ref 3.7–5.3)
Potassium: 4.6 mEq/L (ref 3.7–5.3)
Potassium: 4.2 mEq/L (ref 3.7–5.3)
Potassium: 3.7 mEq/L (ref 3.7–5.3)
SODIUM: 143 meq/L (ref 137–147)
SODIUM: 138 meq/L (ref 137–147)
Sodium: 141 mEq/L (ref 137–147)
Sodium: 142 mEq/L (ref 137–147)
Sodium: 140 mEq/L (ref 137–147)
Sodium: 140 mEq/L (ref 137–147)
Sodium: 138 mEq/L (ref 137–147)

## 2014-02-20 LAB — CBC WITH DIFFERENTIAL/PLATELET
BASOS PCT: 0 % (ref 0–1)
Basophils Absolute: 0 10*3/uL (ref 0.0–0.1)
EOS PCT: 0 % (ref 0–5)
Eosinophils Absolute: 0 10*3/uL (ref 0.0–0.7)
HCT: 43 % (ref 36.0–46.0)
HEMOGLOBIN: 14.9 g/dL (ref 12.0–15.0)
Lymphocytes Relative: 11 % — ABNORMAL LOW (ref 12–46)
Lymphs Abs: 1.9 10*3/uL (ref 0.7–4.0)
MCH: 31.1 pg (ref 26.0–34.0)
MCHC: 34.7 g/dL (ref 30.0–36.0)
MCV: 89.8 fL (ref 78.0–100.0)
MONO ABS: 1.6 10*3/uL — AB (ref 0.1–1.0)
MONOS PCT: 10 % (ref 3–12)
Neutro Abs: 12.9 10*3/uL — ABNORMAL HIGH (ref 1.7–7.7)
Neutrophils Relative %: 79 % — ABNORMAL HIGH (ref 43–77)
Platelets: 389 10*3/uL (ref 150–400)
RBC: 4.79 MIL/uL (ref 3.87–5.11)
RDW: 13.3 % (ref 11.5–15.5)
WBC: 16.4 10*3/uL — ABNORMAL HIGH (ref 4.0–10.5)

## 2014-02-20 LAB — HEMOGLOBIN AND HEMATOCRIT, BLOOD
HCT: 40.5 % (ref 36.0–46.0)
HEMATOCRIT: 42.7 % (ref 36.0–46.0)
HEMATOCRIT: 38.6 % (ref 36.0–46.0)
HEMOGLOBIN: 14 g/dL (ref 12.0–15.0)
Hemoglobin: 14.2 g/dL (ref 12.0–15.0)
Hemoglobin: 13.4 g/dL (ref 12.0–15.0)

## 2014-02-20 LAB — CBG MONITORING, ED: Glucose-Capillary: 437 mg/dL — ABNORMAL HIGH (ref 70–99)

## 2014-02-20 LAB — TROPONIN I
Troponin I: <0.3 ng/mL (ref <0.30)
Troponin I: <0.3 ng/mL (ref <0.30)

## 2014-02-20 LAB — TYPE AND SCREEN
ABO/RH(D): O NEG
Antibody Screen: NEGATIVE

## 2014-02-20 LAB — MRSA PCR SCREENING: MRSA by PCR: NEGATIVE

## 2014-02-20 LAB — LIPASE, BLOOD: Lipase: 58 U/L (ref 11–59)

## 2014-02-20 MED ORDER — ALBUTEROL SULFATE (2.5 MG/3ML) 0.083% IN NEBU: 3.0000 mL | INHALATION_SOLUTION | Freq: Four times a day (QID) | RESPIRATORY_TRACT | Status: DC | PRN

## 2014-02-20 MED ORDER — TRAMADOL HCL 50 MG PO TABS
50.0000 mg | ORAL_TABLET | Freq: Two times a day (BID) | ORAL | Status: DC
  Administered 2014-02-20 – 2014-02-27 (×15): 50 mg via ORAL
  Filled 2014-02-20 (×16): qty 1

## 2014-02-20 MED ORDER — SODIUM CHLORIDE 0.9 % IV SOLN: INTRAVENOUS | Status: DC

## 2014-02-20 MED ORDER — ONDANSETRON HCL 4 MG/2ML IJ SOLN
4.0000 mg | Freq: Four times a day (QID) | INTRAMUSCULAR | Status: DC | PRN
  Administered 2014-02-20 – 2014-02-27 (×7): 4 mg via INTRAVENOUS
  Filled 2014-02-20 (×11): qty 2

## 2014-02-20 MED ORDER — TRAZODONE HCL 50 MG PO TABS
100.0000 mg | ORAL_TABLET | Freq: Every day | ORAL | Status: DC
  Administered 2014-02-20 – 2014-02-24 (×6): 100 mg via ORAL
  Filled 2014-02-20 (×6): qty 2

## 2014-02-20 MED ORDER — SODIUM CHLORIDE 0.9 % IV SOLN
INTRAVENOUS | Status: DC
  Administered 2014-02-21: 2.8 [IU]/h via INTRAVENOUS
  Administered 2014-02-21: 3.5 [IU]/h via INTRAVENOUS
  Administered 2014-02-22: 6 [IU]/h via INTRAVENOUS
  Filled 2014-02-20 (×4): qty 1

## 2014-02-20 MED ORDER — IOHEXOL 300 MG/ML  SOLN
100.0000 mL | Freq: Once | INTRAMUSCULAR | Status: AC | PRN
  Administered 2014-02-20: 100 mL via INTRAVENOUS

## 2014-02-20 MED ORDER — HYDROMORPHONE HCL PF 1 MG/ML IJ SOLN
1.0000 mg | Freq: Once | INTRAMUSCULAR | Status: AC
  Administered 2014-02-20: 1 mg via INTRAVENOUS
  Filled 2014-02-20: qty 1

## 2014-02-20 MED ORDER — DEXTROSE 50 % IV SOLN: 25.0000 mL | INTRAVENOUS | Status: DC | PRN

## 2014-02-20 MED ORDER — ENOXAPARIN SODIUM 40 MG/0.4ML ~~LOC~~ SOLN: 40.0000 mg | SUBCUTANEOUS | Status: DC

## 2014-02-20 MED ORDER — IOHEXOL 300 MG/ML  SOLN: 50.0000 mL | Freq: Once | INTRAMUSCULAR | Status: AC | PRN

## 2014-02-20 MED ORDER — ONDANSETRON HCL 4 MG/2ML IJ SOLN
4.0000 mg | Freq: Three times a day (TID) | INTRAMUSCULAR | Status: DC | PRN
  Administered 2014-02-20: 4 mg via INTRAVENOUS
  Filled 2014-02-20: qty 2

## 2014-02-20 MED ORDER — LEVOTHYROXINE SODIUM 137 MCG PO TABS
137.0000 ug | ORAL_TABLET | Freq: Every day | ORAL | Status: DC
  Administered 2014-02-20 – 2014-02-27 (×7): 137 ug via ORAL
  Filled 2014-02-20 (×10): qty 1

## 2014-02-20 MED ORDER — DEXTROSE-NACL 5-0.45 % IV SOLN
INTRAVENOUS | Status: DC
  Administered 2014-02-20: 1000 mL via INTRAVENOUS
  Administered 2014-02-20 – 2014-02-21 (×2): via INTRAVENOUS

## 2014-02-20 MED ORDER — ONDANSETRON HCL 4 MG/2ML IJ SOLN
4.0000 mg | Freq: Once | INTRAMUSCULAR | Status: AC
  Administered 2014-02-20: 4 mg via INTRAVENOUS
  Filled 2014-02-20: qty 2

## 2014-02-20 MED ORDER — NICOTINE 21 MG/24HR TD PT24
21.0000 mg | MEDICATED_PATCH | Freq: Every day | TRANSDERMAL | Status: DC
  Administered 2014-02-20 – 2014-02-27 (×8): 21 mg via TRANSDERMAL
  Filled 2014-02-20 (×8): qty 1

## 2014-02-20 MED ORDER — ONDANSETRON HCL 4 MG/2ML IJ SOLN
4.0000 mg | Freq: Four times a day (QID) | INTRAMUSCULAR | Status: DC
  Administered 2014-02-20: 4 mg via INTRAVENOUS
  Filled 2014-02-20: qty 2

## 2014-02-20 MED ORDER — METOCLOPRAMIDE HCL 5 MG/ML IJ SOLN
10.0000 mg | Freq: Once | INTRAMUSCULAR | Status: AC
  Administered 2014-02-20: 10 mg via INTRAVENOUS
  Filled 2014-02-20: qty 2

## 2014-02-20 MED ORDER — LISINOPRIL 10 MG PO TABS
20.0000 mg | ORAL_TABLET | Freq: Every day | ORAL | Status: DC
  Administered 2014-02-20 – 2014-02-26 (×6): 20 mg via ORAL
  Filled 2014-02-20 (×8): qty 2

## 2014-02-20 MED ORDER — DULOXETINE HCL 20 MG PO CPEP
20.0000 mg | ORAL_CAPSULE | Freq: Every day | ORAL | Status: DC
  Administered 2014-02-20: 20 mg via ORAL
  Filled 2014-02-20 (×3): qty 1

## 2014-02-20 MED ORDER — PANTOPRAZOLE SODIUM 40 MG IV SOLR
40.0000 mg | Freq: Two times a day (BID) | INTRAVENOUS | Status: DC
  Administered 2014-02-20 – 2014-02-24 (×9): 40 mg via INTRAVENOUS
  Filled 2014-02-20 (×9): qty 40

## 2014-02-20 MED ORDER — GABAPENTIN 100 MG PO CAPS
200.0000 mg | ORAL_CAPSULE | Freq: Four times a day (QID) | ORAL | Status: DC
  Administered 2014-02-20: 200 mg via ORAL
  Filled 2014-02-20: qty 2

## 2014-02-20 MED ORDER — ESCITALOPRAM OXALATE 10 MG PO TABS
10.0000 mg | ORAL_TABLET | Freq: Every day | ORAL | Status: DC
  Administered 2014-02-20 – 2014-02-27 (×7): 10 mg via ORAL
  Filled 2014-02-20 (×8): qty 1

## 2014-02-20 NOTE — Progress Notes (Signed)
Spoke with patient about diabetes and home regimen for diabetes control. Upon entering room, patient is guarding abdomen and complaining of abdominal pain. In talking with the patient she has received pain medication PO and states that she feels she needs IV pain medications since she is vomiting so much. Discussed with Mikki Santee, RN. Patient reports that she was diagnosed with diabetes at the age of 53 years old (told she had "Juvenile Diabetes").   Patient reports that she is followed by her PCP for diabetes management and currently she takes Lantus 14 units BID and Novolog 10-15 units TID with meals as an outpatient for diabetes control.  Inquired about knowledge about A1C and patient reports that she knows what an V7Q is but is uncertain of her last I6N but states that she "thinks it was good". Asked about monitoring behaviors and patient reports that she checks her blood glucose 3-4 times per day and it had been running well until she became sick about 4 days ago. Patient states that over the past 4 days however she has not been checking her sugar as much and she has only been taking Novolog insulin as she has not been taking the Lantus since she is not eating and was vomiting so much. Explained importance of insulin especially if she is a type 1 diabetic. Discussed impact of nutrition, exercise, stress, sickness, and medications on diabetes control. Discussed basal and bolus insulin and stressed the need for insulin especially during sickness. Discussed importance of checking CBGs and maintaining good CBG control to prevent long-term and short-term complications. Patient states that she is not followed by an endocrinologist and she would like to start seeing Dr. Dorris Fetch to assist her with improving diabetes control.  Patient verbalized understanding of information discussed and she states that she has no further questions at this time related to diabetes.  Will continue to follow.  Thanks, Barnie Alderman, RN, MSN,  CCRN Diabetes Coordinator [facility] [phone] (Team Pager) [phone] ([facility] office) [phone] [facility] office)

## 2014-02-20 NOTE — H&P (Addendum)
PCP:   FANTA,TESFAYE, MD   Chief Complaint:  Nausea and vomiting  HPI:  53 year old female who  has a past medical history of Hypothyroid; Anxiety; Diabetes mellitus; COPD (chronic obstructive pulmonary disease); GERD (gastroesophageal reflux disease); PUD (peptic ulcer disease); Hiatal hernia; and Chronic abdominal pain. Presents to the ED with chief complaint of nausea and vomiting which started yesterday, patient says that she had back-to-back episodes of vomiting and she vomited at least 20 times per day since it started on Saturday. She also had associated chest pain, hematemesis, abdominal pain. Patient has a history of diabetes mellitus and takes Lantus at home. As per patient she was taking Lantus but was not able to eat and drink due to continued vomiting. She denies fever no dysuria urgency or frequency of urination. She also has been complaining of dyspnea on exertion but no shortness of breath at this time. Patient does have a history of COPD and smokes cigarettes one third pack everyday. In the ED patient found to have DKA, with AG of 33.  Allergies:   Allergies  Allergen Reactions  . Phenytoin Anaphylaxis    Reaction: SJS (STEVENS JOHNSON SYNDROME)  . Sulfonamide Derivatives Nausea And Vomiting  . Tetracyclines & Related Nausea And Vomiting      Past Medical History  Diagnosis Date  . Hypothyroid   . Anxiety   . Diabetes mellitus     insulin  . COPD (chronic obstructive pulmonary disease)   . GERD (gastroesophageal reflux disease)   . PUD (peptic ulcer disease)     ? remote past, no reports from [facility] or [facility], states possibly Dr. Laural Golden.   . Hiatal hernia   . Chronic abdominal pain     Past Surgical History  Procedure Laterality Date  . Abdominal hysterectomy    . Tonsillectomy    . Appendectomy    . Bladder repair    . Esophagogastroduodenoscopy      in remote past, unclear who performed. No op notes from [facility] or [facility] (pt thought Dr. Laural Golden)  .  Esophagogastroduodenoscopy  02/12/2012    RMR:  Normal esophagus status post dilation as described above. Small hiatal hernia. Abnormal gastric because of uncertain significance-status post biopsy    Prior to Admission medications   Medication Sig Start Date End Date Taking? Authorizing Provider  albuterol (PROVENTIL HFA;VENTOLIN HFA) 108 (90 BASE) MCG/ACT inhaler Inhale 2 puffs into the lungs every 6 (six) hours as needed. For shortness of breath 03/08/12   Encarnacion Slates, NP  cholecalciferol (VITAMIN D) 1000 UNITS tablet Take 1,000 Units by mouth daily.    Historical Provider, MD  DULoxetine (CYMBALTA) 20 MG capsule Take 1 capsule (20 mg total) by mouth daily. For depression 03/08/12 03/08/13  Encarnacion Slates, NP  escitalopram (LEXAPRO) 10 MG tablet Take 10 mg by mouth daily.    Historical Provider, MD  esomeprazole (NEXIUM) 40 MG capsule Take 1 capsule (40 mg total) by mouth 2 (two) times daily. 01/24/13   Orvil Feil, NP  gabapentin (NEURONTIN) 100 MG capsule Take 2 capsules (200 mg total) by mouth 4 (four) times daily. For anxiety 03/08/12 03/08/13  Encarnacion Slates, NP  insulin aspart (NOVOLOG) 100 UNIT/ML injection Inject 10-15 Units into the skin 3 (three) times daily before meals. For blood sugar control 03/08/12   Encarnacion Slates, NP  insulin glargine (LANTUS) 100 UNIT/ML injection Inject 14 Units into the skin 2 (two) times daily. 14 units in am and ) For blood sugar  control 14 units in pm        ) 03/08/12   Encarnacion Slates, NP  levothyroxine (SYNTHROID, LEVOTHROID) 137 MCG tablet Take 1 tablet (137 mcg total) by mouth every morning. For thyroid hormone replacement 03/08/12   Encarnacion Slates, NP  ondansetron (ZOFRAN) 4 MG tablet Take 1 tablet (4 mg total) by mouth every 8 (eight) hours as needed for nausea. 03/02/13   Alfonzo Feller, DO  polyethylene glycol powder (GLYCOLAX/MIRALAX) powder Take 17 g by mouth daily. 02/03/14   Historical Provider, MD  predniSONE (DELTASONE) 10 MG tablet Take 10 mg by mouth  daily.    Historical Provider, MD  promethazine (PHENERGAN) 25 MG tablet Take 25 mg by mouth 3 (three) times daily as needed. For nausea and/or vimiting 02/03/14   Historical Provider, MD  traMADol (ULTRAM) 50 MG tablet Take 50 mg by mouth 2 (two) times daily. 02/03/14   Historical Provider, MD  traZODone (DESYREL) 100 MG tablet Take 100 mg by mouth at bedtime.    Historical Provider, MD    Social History:  reports that she has been smoking Cigarettes.  She has a 7.5 pack-year smoking history. She does not have any smokeless tobacco history on file. She reports that she uses illicit drugs (Cocaine). She reports that she does not drink alcohol.  Family History  Problem Relation Age of Onset  . Liver cancer Mother     remission for 2 years  . Colon cancer Neg Hx      All the positives are listed in BOLD  Review of Systems:  HEENT: Headache, blurred vision, runny nose, sore throat Neck: Hypothyroidism, hyperthyroidism,,lymphadenopathy Chest : Shortness of breath, history of COPD, Asthma Heart : Chest pain, history of coronary arterey disease GI:  Nausea, vomiting, diarrhea, constipation, GERD GU: Dysuria, urgency, frequency of urination, hematuria Neuro: Stroke, seizures, syncope Psych: Depression, anxiety, hallucinations   Physical Exam: Blood pressure 149/75, pulse 105, temperature 98.5 F (36.9 C), temperature source Oral, resp. rate 16, height 5\' 5"  (1.651 m), weight 70.9 kg (156 lb 4.9 oz), SpO2 99.00%. Constitutional:   Patient is a well-developed and well-nourished female in no acute distress and cooperative with exam. Head: Normocephalic and atraumatic Mouth: Mucus membranes moist Eyes: PERRL, EOMI, conjunctivae normal Neck: Supple, No Thyromegaly Cardiovascular: RRR, S1 normal, S2 normal Pulmonary/Chest: CTAB, no wheezes, rales, or rhonchi Abdominal: Soft. Non-tender, non-distended, bowel sounds are normal, no masses, organomegaly, or guarding present.  Neurological: A&O  x3, Strenght is normal and symmetric bilaterally, cranial nerve II-XII are grossly intact, no focal motor deficit, sensory intact to light touch bilaterally.  Extremities : No Cyanosis, Clubbing or Edema  Labs on Admission:  Basic Metabolic Panel:  Recent Labs Lab 02/19/14 2138  NA 136*  K 5.0  CL 91*  CO2 12*  GLUCOSE 515*  BUN 20  CREATININE 0.92  CALCIUM 10.4   Liver Function Tests:  Recent Labs Lab 02/19/14 2138  AST 10  ALT 8  ALKPHOS 138*  BILITOT 0.2*  PROT 8.6*  ALBUMIN 4.2    Recent Labs Lab 02/19/14 2331  LIPASE 58   No results found for this basename: AMMONIA,  in the last 168 hours CBC:  Recent Labs Lab 02/19/14 2138  WBC 12.6*  NEUTROABS 11.2*  HGB 15.4*  HCT 44.6  MCV 90.1  PLT 365   Cardiac Enzymes: No results found for this basename: CKTOTAL, CKMB, CKMBINDEX, TROPONINI,  in the last 168 hours  BNP (last 3 results) No results  found for this basename: PROBNP,  in the last 8760 hours CBG:  Recent Labs Lab 02/19/14 2143 02/20/14 0051  GLUCAP 494* 437*    Radiological Exams on Admission: Dg Abd Acute W/chest  02/20/2014   CLINICAL DATA:  HEMATEMESIS  EXAM: ACUTE ABDOMEN SERIES (ABDOMEN 2 VIEW & CHEST 1 VIEW)  COMPARISON:  Prior radiograph from 03/02/2013  FINDINGS: The cardiac and mediastinal silhouettes are stable in size and contour, and remain within normal limits.  The lungs are normally inflated. No airspace consolidation, pleural effusion, or pulmonary edema is identified. There is no pneumothorax.  A paucity of gas somewhat limits evaluation of the bowels. No evidence of obstruction or ileus. No soft tissue mass or abnormal calcification. No free intraperitoneal air.  No acute osseous abnormality.  IMPRESSION: 1. Nonobstructive bowel gas pattern with no radiographic evidence of acute intra-abdominal process. 2. No acute cardiopulmonary abnormality.   Electronically Signed   By: Jeannine Boga M.D.   On: 02/20/2014 00:46     EKG: Independently reviewed sinus tachycardia, with nonspecific ST-T changes   Assessment/Plan Principal Problem:   DKA (diabetic ketoacidoses) Active Problems:   CHEST PAIN UNSPECIFIED   Hematemesis   Nausea with vomiting  DKA Patient has DKA with AG 33, patient's CO2 is 12, which is higher than expected. Delta gap is 33-12= 21 and delta bicarbonate is 24-12= 12, the bicarbonate gap is equal to 21-12 equal 9,  which showed that patient has concurrent metabolic alkalosis due to nausea and vomiting. Hopefully giving IV fluids along with IV insulin should resolve both, we'll continue to monitor BMP as per DKA protocol.  Nausea and vomiting Patient will be started on Zofran when necessary. We'll keep her n.p.o.  ? Hematemesis As per patient she had bloody vomiting, will check H&H every 8 hours x3 At this time hemoglobin is stable, but could be hemoconcentration due to nausea and vomiting Will continue IV Protonix  Tobacco abuse Nicotine patch  Chest pain Patient also complains of chest pain which could be due to nausea and vomiting. She does have a history of GERD. We'll cycle cardiac enzymes. EKG shows nonspecific ST-T changes.  Chronic prednisone therapy As per patient she was prescribed prednisone in the past for arthritis, she is currently not taking prednisone for the past 3 months. I will not restart prednisone at this time, we'll leave up to the discretion of the primary care provider.  DVT prophylaxis SCD  Code status: Patient is DO NOT RESUSCITATE  Family discussion: Discussed with patient in detail   Time Spent on Admission: 18 minutes  [facility] Pager: [phone] 02/20/2014, 1:43 AM  If 7PM-7AM, please contact night-coverage  [email or web address]  Password [email or web address]

## 2014-02-20 NOTE — Plan of Care (Signed)
Problem: Consults Goal: Diabetic Ketoacidosis (DKA) Patient Education See Patient Education Modules for education specifics. Outcome: Progressing Admitted with DKA, has been admitted for this in the past Goal: Diabetes Guidelines if Diabetic/Glucose > 140 If diabetic or lab glucose is > 140 mg/dl - Initiate Diabetes/Hyperglycemia Guidelines & Document Interventions  Outcome: Progressing Patient on glucostabilizer and insulin gtt at present, GAP 33  Problem: Phase I Progression Outcomes Goal: NPO or per MD order Outcome: Progressing Patient NPO and clear diabetic drinks Goal: K+ level approaching normal with therapy Outcome: Progressing Potassium 5.0 at present Goal: Nausea/vomiting controlled with antiemetics Outcome: Progressing No signs of nausea or vomitus since admitted to floor, Goal: Pain controlled with appropriate interventions Outcome: Progressing Tramadol 50mg  po given for chest wall pain and burning Goal: OOB as tolerated unless otherwise ordered Outcome: Progressing Patient transferred in room from stretcher to bed, but is lightheaded and monitored. Stated she has had several falls at home over the past 6 months. Goal: Initial discharge plan identified Outcome: Progressing To home lives with brother

## 2014-02-20 NOTE — Progress Notes (Signed)
CRITICAL VALUE ALERT  Critical value received:  CO2 10  Date of notification:  02/20/14  Time of notification:  0330  Critical value read back:yes  Nurse who received alert: Hardin Negus RN  MD notified (1st page):  Dr. Darrick Meigs  Time of first page:  [phone] -   MD notified (2nd page):  Time of second page:  Responding MD:  Dr. Darrick Meigs  Time MD responded:

## 2014-02-20 NOTE — Progress Notes (Signed)
Subjective: Patient was admitted due to nausea, vomiting and ? Hematemesis. Patient was found to have DKA. She is feeling better. No drop in her H/H. Patient feels better today.   Objective: Vital signs in last 24 hours: Temp:  [98 F (36.7 C)-98.6 F (37 C)] 98.6 F (37 C) (06/01 0400) Pulse Rate:  [93-111] 106 (06/01 0500) Resp:  [16-20] 19 (06/01 0600) BP: (149-173)/(68-90) 164/77 mmHg (06/01 0600) SpO2:  [95 %-99 %] 97 % (06/01 0500) Weight:  [70.9 kg (156 lb 4.9 oz)-74.39 kg (164 lb)] 70.9 kg (156 lb 4.9 oz) (06/01 0500) Weight change:  Last BM Date: 02/19/14  Intake/Output from previous day: 05/31 0701 - 06/01 0700 In: 2442.3 [I.V.:2442.3] Out: -   PHYSICAL EXAM General appearance: alert and no distress Resp: clear to auscultation bilaterally Cardio: S1, S2 normal GI: soft, non-tender; bowel sounds normal; no masses,  no organomegaly Extremities: extremities normal, atraumatic, no cyanosis or edema  Lab Results:    @labtest @ [facility] No results found for this basename: PHART, PCO2, PO2ART, TCO2, HCO3,  in the last 72 hours CULTURES Recent Results (from the past 240 hour(s))  MRSA PCR SCREENING     Status: None   Collection Time    02/20/14  1:54 AM      Result Value Ref Range Status   MRSA by PCR NEGATIVE  NEGATIVE Final   Comment:            The GeneXpert MRSA Assay (FDA     approved for NASAL specimens     only), is one component of a     comprehensive MRSA colonization     surveillance program. It is not     intended to diagnose MRSA     infection nor to guide or     monitor treatment for     MRSA infections.   Studies/Results: Dg Abd Acute W/chest  02/20/2014   CLINICAL DATA:  HEMATEMESIS  EXAM: ACUTE ABDOMEN SERIES (ABDOMEN 2 VIEW & CHEST 1 VIEW)  COMPARISON:  Prior radiograph from 03/02/2013  FINDINGS: The cardiac and mediastinal silhouettes are stable in size and contour, and remain within normal limits.  The lungs are normally inflated. No airspace  consolidation, pleural effusion, or pulmonary edema is identified. There is no pneumothorax.  A paucity of gas somewhat limits evaluation of the bowels. No evidence of obstruction or ileus. No soft tissue mass or abnormal calcification. No free intraperitoneal air.  No acute osseous abnormality.  IMPRESSION: 1. Nonobstructive bowel gas pattern with no radiographic evidence of acute intra-abdominal process. 2. No acute cardiopulmonary abnormality.   Electronically Signed   By: Jeannine Boga M.D.   On: 02/20/2014 00:46    Medications: I have reviewed the patient's current medications.  Assesment: Principal Problem:   DKA (diabetic ketoacidoses) Active Problems:   CHEST PAIN UNSPECIFIED   Hematemesis   Nausea with vomiting    Plan: Medications reviewed Will continue insulin therapy Continue IV hydration Will monitor CBC/BMP Endocrine and GI consult.    LOS: 1 day   [name] 02/20/2014, 7:55 AM

## 2014-02-20 NOTE — Consult Note (Addendum)
Referring Provider: Dr. Legrand Rams Primary Care Physician:  Dr. Legrand Rams Primary Gastroenterologist:  Dr. Gala Romney   Date of Admission: 02/19/14 Date of Consultation: 02/20/14  Reason for Consultation:  Hematemesis  HPI:  [name] is a 53 year old female admitted with DKA and reports of hematemesis. She has had no significant drop in Hgb. Last seen in 2013 with epigastric pain, coffee-ground emesis, with EGD in May 2013 revealing normal esophagus s/p 43 F dilation, mild chronic gastritis. No prior colonoscopy. Lost to follow-up.   States acute onset of N/V a few days ago. States dark blood. Hurts to burp. Points to epigastric and chest area as painful. Constant pain. No melena. Pain with eating/drinking, starts vomiting. Liquid and solid food dysphagia. Present about a month. Prednisone daily. Last dose 3-4 months ago. Chronic for RA. Chronic GERD. Nexium BID, which controls GERD. Gallbladder remains in situ. Wt 167 in 2013, now 156. Believes she has lost weight in past 3 weeks. No prior colonoscopy. Chronic constipation. Miralax prn. Does not feel improved symptomatically from admission.   Spoke with Mikki Santee, RN for patient today. He denies any evidence of hematemesis that he has seen.   Past Medical History  Diagnosis Date  . Hypothyroid   . Anxiety   . Diabetes mellitus     insulin  . COPD (chronic obstructive pulmonary disease)   . GERD (gastroesophageal reflux disease)   . PUD (peptic ulcer disease)     ? remote past, no reports from [place] or [facility], states possibly Dr. Laural Golden.   . Hiatal hernia   . Chronic abdominal pain     Past Surgical History  Procedure Laterality Date  . Abdominal hysterectomy    . Tonsillectomy    . Appendectomy    . Bladder repair    . Esophagogastroduodenoscopy      in remote past, unclear who performed. No op notes from [facility] or [facility] (pt thought Dr. Laural Golden)  . Esophagogastroduodenoscopy  02/12/2012    Dr. Gala Romney:  Normal esophagus status post 44 F  dilation. Small hiatal hernia. mild chronic gastritis    Prior to Admission medications   Medication Sig Start Date End Date Taking? Authorizing Provider  albuterol (PROVENTIL HFA;VENTOLIN HFA) 108 (90 BASE) MCG/ACT inhaler Inhale 2 puffs into the lungs every 6 (six) hours as needed. For shortness of breath 03/08/12   Encarnacion Slates, NP  cholecalciferol (VITAMIN D) 1000 UNITS tablet Take 1,000 Units by mouth daily.    Historical Provider, MD  DULoxetine (CYMBALTA) 20 MG capsule Take 1 capsule (20 mg total) by mouth daily. For depression 03/08/12 03/08/13  Encarnacion Slates, NP  escitalopram (LEXAPRO) 10 MG tablet Take 10 mg by mouth daily.    Historical Provider, MD  esomeprazole (NEXIUM) 40 MG capsule Take 1 capsule (40 mg total) by mouth 2 (two) times daily. 01/24/13   Orvil Feil, NP  gabapentin (NEURONTIN) 100 MG capsule Take 2 capsules (200 mg total) by mouth 4 (four) times daily. For anxiety 03/08/12 03/08/13  Encarnacion Slates, NP  insulin aspart (NOVOLOG) 100 UNIT/ML injection Inject 10-15 Units into the skin 3 (three) times daily before meals. For blood sugar control 03/08/12   Encarnacion Slates, NP  insulin glargine (LANTUS) 100 UNIT/ML injection Inject 14 Units into the skin 2 (two) times daily. 14 units in am and ) For blood sugar control 14 units in pm        ) 03/08/12   Encarnacion Slates, NP  levothyroxine (SYNTHROID,  LEVOTHROID) 137 MCG tablet Take 1 tablet (137 mcg total) by mouth every morning. For thyroid hormone replacement 03/08/12   Encarnacion Slates, NP  ondansetron (ZOFRAN) 4 MG tablet Take 1 tablet (4 mg total) by mouth every 8 (eight) hours as needed for nausea. 03/02/13   Alfonzo Feller, DO  polyethylene glycol powder (GLYCOLAX/MIRALAX) powder Take 17 g by mouth daily. 02/03/14   Historical Provider, MD  predniSONE (DELTASONE) 10 MG tablet Take 10 mg by mouth daily.    Historical Provider, MD  promethazine (PHENERGAN) 25 MG tablet Take 25 mg by mouth 3 (three) times daily as needed. For nausea and/or  vimiting 02/03/14   Historical Provider, MD  traMADol (ULTRAM) 50 MG tablet Take 50 mg by mouth 2 (two) times daily. 02/03/14   Historical Provider, MD  traZODone (DESYREL) 100 MG tablet Take 100 mg by mouth at bedtime.    Historical Provider, MD    Current Facility-Administered Medications  Medication Dose Route Frequency Provider Last Rate Last Dose  . 0.9 %  sodium chloride infusion   Intravenous Continuous Oswald Hillock, MD      . albuterol (PROVENTIL) (2.5 MG/3ML) 0.083% nebulizer solution 3 mL  3 mL Inhalation Q6H PRN Oswald Hillock, MD      . dextrose 5 %-0.45 % sodium chloride infusion   Intravenous Continuous Oswald Hillock, MD 75 mL/hr at 02/20/14 0500    . dextrose 50 % solution 25 mL  25 mL Intravenous PRN Oswald Hillock, MD      . DULoxetine (CYMBALTA) DR capsule 20 mg  20 mg Oral Daily Oswald Hillock, MD      . escitalopram (LEXAPRO) tablet 10 mg  10 mg Oral Daily Oswald Hillock, MD      . gabapentin (NEURONTIN) capsule 200 mg  200 mg Oral QID Oswald Hillock, MD   200 mg at 02/20/14 0820  . insulin regular (NOVOLIN R,HUMULIN R) 1 Units/mL in sodium chloride 0.9 % 100 mL infusion   Intravenous Continuous Oswald Hillock, MD 6.8 mL/hr at 02/20/14 0547 6.8 Units/hr at 02/20/14 0547  . levothyroxine (SYNTHROID, LEVOTHROID) tablet 137 mcg  137 mcg Oral QAC breakfast Oswald Hillock, MD   137 mcg at 02/20/14 0820  . nicotine (NICODERM CQ - dosed in mg/24 hours) patch 21 mg  21 mg Transdermal Daily Oswald Hillock, MD      . ondansetron [facility] injection 4 mg  4 mg Intravenous 4 times per day Rosita Fire, MD      . ondansetron (ZOFRAN) injection 4 mg  4 mg Intravenous Once Rosita Fire, MD      . traMADol Veatrice Bourbon) tablet 50 mg  50 mg Oral BID Oswald Hillock, MD   50 mg at 02/20/14 0207  . traZODone (DESYREL) tablet 100 mg  100 mg Oral QHS Oswald Hillock, MD   100 mg at 02/20/14 0207    Allergies as of 02/19/2014 - Review Complete 02/19/2014  Allergen Reaction Noted  . Phenytoin Anaphylaxis 03/21/2010  .  Sulfonamide derivatives Nausea And Vomiting 02/09/2012  . Tetracyclines & related Nausea And Vomiting 09/01/2011    Family History  Problem Relation Age of Onset  . Liver cancer Mother     remission for 2 years  . Colon cancer Neg Hx     History   Social History  . Marital Status: Divorced    Spouse Name: N/A    Number of Children: N/A  . Years of  Education: N/A   Occupational History  . Not on file.   Social History Main Topics  . Smoking status: Current Every Day Smoker -- 0.50 packs/day for 15 years    Types: Cigarettes  . Smokeless tobacco: Not on file  . Alcohol Use: No  . Drug Use: Yes    Special: Cocaine     Comment: last used several months ago  . Sexual Activity: Not on file   Other Topics Concern  . Not on file   Social History Narrative  . No narrative on file    Review of Systems: Gen: see HPI CV: +palpitations Resp: +DOE GI: see HPI GU : Denies urinary burning, urinary frequency, urinary incontinence.  MS: +RA Derm: Denies rash, itching, dry skin Psych: +anxiety Heme: Denies bruising, bleeding, and enlarged lymph nodes.  Physical Exam: Vital signs in last 24 hours: Temp:  [98 F (36.7 C)-98.6 F (37 C)] 98.2 F (36.8 C) (06/01 0807) Pulse Rate:  [93-111] 106 (06/01 0500) Resp:  [16-20] 19 (06/01 0600) BP: (149-173)/(68-90) 164/77 mmHg (06/01 0600) SpO2:  [95 %-99 %] 97 % (06/01 0500) Weight:  [156 lb 4.9 oz (70.9 kg)-164 lb (74.39 kg)] 156 lb 4.9 oz (70.9 kg) (06/01 0500) Last BM Date: 02/19/14 General:   Alert,  Anxious, grimacing Head:  Normocephalic and atraumatic. Eyes:  Sclera clear, no icterus.   Conjunctiva pink. Ears:  Normal auditory acuity. Nose:  No deformity, discharge,  or lesions. Mouth:  No deformity or lesions, dentition normal. Neck:  Supple; no masses or thyromegaly. Lungs:  Clear throughout to auscultation.   No wheezes, crackles, or rhonchi. No acute distress. Heart: S1 S2 present, mild tachycardia; no murmurs,  clicks, rubs,  or gallops. Abdomen:  Soft, diffuse TTP specifically epigastric and suprapubic, no rebound or guarding, no hepatomegaly appreciated.  Rectal:  Deferred until time of colonoscopy.   Msk:  Symmetrical without gross deformities. Normal posture. Extremities:  Without clubbing or edema. Neurologic:  Alert and  oriented x4;  grossly normal neurologically. Skin:  Intact without significant lesions or rashes. Cervical Nodes:  No significant cervical adenopathy. Psych:  Short answers, flat affect.   Intake/Output from previous day: 05/31 0701 - 06/01 0700 In: 2442.3 [I.V.:2442.3] Out: -  Intake/Output this shift:    Lab Results:  Recent Labs  02/19/14 2138 02/20/14 0215 02/20/14 0818  WBC 12.6*  --   --   HGB 15.4* 14.2 14.0  HCT 44.6 42.7 40.5  PLT 365  --   --    BMET  Recent Labs  02/20/14 0406 02/20/14 0707 02/20/14 0818  NA 143 142 140  K 4.4 4.6 4.2  CL 101 104 102  CO2 14* 16* 18*  GLUCOSE 312* 198* 163*  BUN 21 21 20   CREATININE 0.88 0.72 0.72  CALCIUM 9.9 9.9 9.7   LFT  Recent Labs  02/19/14 2138  PROT 8.6*  ALBUMIN 4.2  AST 10  ALT 8  ALKPHOS 138*  BILITOT 0.2*   Lab Results  Component Value Date   LIPASE 58 02/19/2014     Studies/Results: Dg Abd Acute W/chest  02/20/2014   CLINICAL DATA:  HEMATEMESIS  EXAM: ACUTE ABDOMEN SERIES (ABDOMEN 2 VIEW & CHEST 1 VIEW)  COMPARISON:  Prior radiograph from 03/02/2013  FINDINGS: The cardiac and mediastinal silhouettes are stable in size and contour, and remain within normal limits.  The lungs are normally inflated. No airspace consolidation, pleural effusion, or pulmonary edema is identified. There is no pneumothorax.  A paucity  of gas somewhat limits evaluation of the bowels. No evidence of obstruction or ileus. No soft tissue mass or abnormal calcification. No free intraperitoneal air.  No acute osseous abnormality.  IMPRESSION: 1. Nonobstructive bowel gas pattern with no radiographic evidence of  acute intra-abdominal process. 2. No acute cardiopulmonary abnormality.   Electronically Signed   By: Jeannine Boga M.D.   On: 02/20/2014 00:46    Impression: 53 year old female admitted with DKA, likely source of repetitive N/V; also reports coffee-ground emesis but none witnessed by ICU staff. No melena appreciated. Dysphagia noted with solid foods and liquids X 1 month. Empirically dilated in May 2013 with normal esophagus and mild chronic gastritis.   Hgb has remained stable overall, no melena noted. Abdomen diffusely tender to palpation but no peritoneal signs; patient with history of chronic abdominal pain and AAS without acute findings. Mild tachycardia. No CT this admission; gallbladder remains in situ with last [place] of abdomen in April 2013 negative.   Likely symptoms secondary to repetitive vomiting, but she would benefit from an EGD this admission for further evaluation.Unable to exclude biliary component to abdominal pain but less likely. Dysphagia likely secondary to esophagitis, possible web, ring, or stricture. With her significant tenderness diffusely to palpation on abdominal exam, proceed with repeat CBC, LFTs, and CT abd/pelvis now. Chronic constipation could be an exacerbating factor to chronic abdominal pain. Likely EGD in next 24 hours or prior to discharge. Again, no overt signs of GI bleeding at this time.   Plan: LFTs, CBC now CT abd/pelvis now Zofran scheduled around the clock Start PPI IV BID, first dose now EGD tentatively 6/2 with Phenergan 25 mg IV on call    Orvil Feil, ANP-BC [facility]      LOS: 1 day    02/20/2014, 9:14 AM   Attending note:  Patient seen and examined in the ICU this evening. CT demonstrated focal fatty sparing in the liver otherwise essentially negative. White count 16,000 LFTs okay except for alkaline phosphatase of 130.  Former abnormality likely related to stress with DKA. Agree with need for EGD. Will reassess  first thing tomorrow morning and decide about timing of procedure.

## 2014-02-20 NOTE — Progress Notes (Signed)
UR chart review completed.  

## 2014-02-20 NOTE — Progress Notes (Signed)
INITIAL NUTRITION ASSESSMENT  DOCUMENTATION CODES Per approved criteria  -Not Applicable   NUTRITION DIAGNOSIS: Inadequate oral intake related to nausea and vomiting as evidenced by pt hx .   Goal: Pt will tolerate diet advancement to meet >90% of est nutrition needs  Monitor: diet advancement and adequacy of intake  Reason for Assessment: Malnutrition Screen Score =  2  53 y.o. female  Admitting Dx: DKA (diabetic ketoacidoses)  ASSESSMENT: Pt still very nauseated today and says that she has been "vomiting blood". She is s/p EGD today.  Pt prefers that I return later for her to discuss her nutrition hx. She is unable to participate in nutrition focused exam at this time. Weight hx reviewed and does not reveal significant change.  Height: Ht Readings from Last 1 Encounters:  02/20/14 5\' 5"  (1.651 m)    Weight: Wt Readings from Last 1 Encounters:  02/20/14 156 lb 4.9 oz (70.9 kg)    Ideal Body Weight: 125# (56.8 kg)  % Ideal Body Weight: 125%  Wt Readings from Last 10 Encounters:  02/20/14 156 lb 4.9 oz (70.9 kg)  03/02/13 165 lb (74.844 kg)  03/03/12 167 lb (75.751 kg)  03/02/12 157 lb (71.215 kg)  02/12/12 167 lb (75.751 kg)  02/12/12 167 lb (75.751 kg)  01/28/12 167 lb 6.4 oz (75.932 kg)  09/01/11 165 lb (74.844 kg)  04/10/11 153 lb (69.4 kg)  03/26/10 157 lb (71.215 kg)    Usual Body Weight: 165#  % Usual Body Weight: 95%  BMI:  Body mass index is 26.01 kg/(m^2).overweight  Estimated Nutritional Needs: Kcal: 1425-1710 Protein: 68-79 gr Fluid: 1.4-1.7 liters daily  Skin: intact  Diet Order: NPO  EDUCATION NEEDS: Education not appropriate at this time   Intake/Output Summary (Last 24 hours) at 02/20/14 1012 Last data filed at 02/20/14 1003  Gross per 24 hour  Intake 2442.29 ml  Output   1100 ml  Net 1342.29 ml    Last BM: 02/19/14   Labs:   Recent Labs Lab 02/20/14 0406 02/20/14 0707 02/20/14 0818  NA 143 142 140  K 4.4 4.6 4.2   CL 101 104 102  CO2 14* 16* 18*  BUN 21 21 20   CREATININE 0.88 0.72 0.72  CALCIUM 9.9 9.9 9.7  GLUCOSE 312* 198* 163*    CBG (last 3)   Recent Labs  02/20/14 0638 02/20/14 0746 02/20/14 0842  GLUCAP 165* 153* 151*    Scheduled Meds: . DULoxetine  20 mg Oral Daily  . escitalopram  10 mg Oral Daily  . gabapentin  200 mg Oral QID  . levothyroxine  137 mcg Oral QAC breakfast  . nicotine  21 mg Transdermal Daily  . ondansetron (ZOFRAN) IV  4 mg Intravenous 4 times per day  . pantoprazole (PROTONIX) IV  40 mg Intravenous Q12H  . traMADol  50 mg Oral BID  . traZODone  100 mg Oral QHS    Continuous Infusions: . sodium chloride Stopped (02/20/14 0449)  . dextrose 5 % and 0.45% NaCl 75 mL/hr at 02/20/14 0500  . insulin (NOVOLIN-R) infusion 6.8 Units/hr (02/20/14 0547)    Past Medical History  Diagnosis Date  . Hypothyroid   . Anxiety   . Diabetes mellitus     insulin  . COPD (chronic obstructive pulmonary disease)   . GERD (gastroesophageal reflux disease)   . PUD (peptic ulcer disease)     ? remote past, no reports from [place] or [facility], states possibly Dr. Laural Golden.   . Hiatal  hernia   . Chronic abdominal pain     Past Surgical History  Procedure Laterality Date  . Abdominal hysterectomy    . Tonsillectomy    . Appendectomy    . Bladder repair    . Esophagogastroduodenoscopy      in remote past, unclear who performed. No op notes from [facility] or [facility] (pt thought Dr. Laural Golden)  . Esophagogastroduodenoscopy  02/12/2012    Dr. Gala Romney:  Normal esophagus status post 27 F dilation. Small hiatal hernia. mild chronic gastritis    Colman Cater MS,RD,CSG,LDN Office: [phone] Pager: [phone]

## 2014-02-20 NOTE — Consult Note (Signed)
[name] MRN: [id number] DOB/AGE: 55-Mar-1962 55 y.o. Primary Care Physician:FANTA,TESFAYE, MD Admit date: 02/19/2014 Chief Complaint: DKA HPI:  53 year old female who has a past medical history of  Chronically uncontrolled type 1 DM x 31 years, Hypothyroidism; Anxiety;  COPD (chronic obstructive pulmonary disease); GERD (gastroesophageal reflux disease); PUD (peptic ulcer disease); Hiatal hernia; and Chronic abdominal pain.  She presents to the ED with chief complaint of nausea and vomiting  X 1 day and was found to have wide AG (33) acidosis consistent with DKA. She claims to have been taking her insulin as ordered Lantus 14 units BID, and Novolog 10-15 units TID AC. No reported fever, urinary symptoms.   She also had associated chest pain, hematemesis, abdominal pain. Her cardiac biomarkers are negative for ACS so far. She has had repeated emesis that interfered with her ability to take in oral nutrition.   Past Medical History  Diagnosis Date  . Hypothyroid   . Anxiety   . Diabetes mellitus     insulin  . COPD (chronic obstructive pulmonary disease)   . GERD (gastroesophageal reflux disease)   . PUD (peptic ulcer disease)     ? remote past, no reports from [place] or [facility], states possibly Dr. Laural Golden.   . Hiatal hernia   . Chronic abdominal pain    Family History  Problem Relation Age of Onset  . Liver cancer Mother     remission for 2 years  . Colon cancer Neg Hx    Social History:  reports that she has been smoking Cigarettes.  She has a 7.5 pack-year smoking history. She does not have any smokeless tobacco history on file. She reports that she uses illicit drugs (Cocaine). She reports that she does not drink alcohol.   Allergies:  Allergies  Allergen Reactions  . Phenytoin Anaphylaxis    Reaction: SJS (STEVENS JOHNSON SYNDROME)  . Sulfonamide Derivatives Nausea And Vomiting  . Tetracyclines & Related Nausea And Vomiting    Medications Prior to Admission   Medication Sig Dispense Refill  . albuterol (PROVENTIL HFA;VENTOLIN HFA) 108 (90 BASE) MCG/ACT inhaler Inhale 2 puffs into the lungs every 6 (six) hours as needed. For shortness of breath      . escitalopram (LEXAPRO) 10 MG tablet Take 10 mg by mouth daily.      Marland Kitchen esomeprazole (NEXIUM) 40 MG capsule Take 1 capsule (40 mg total) by mouth 2 (two) times daily.  60 capsule  5  . insulin aspart (NOVOLOG) 100 UNIT/ML injection Inject 10-15 Units into the skin 3 (three) times daily before meals. For blood sugar control  1 vial    . insulin glargine (LANTUS) 100 UNIT/ML injection Inject 14 Units into the skin 2 (two) times daily. 14 units in am and ) For blood sugar control 14 units in pm        )  10 mL    . levothyroxine (SYNTHROID, LEVOTHROID) 137 MCG tablet Take 1 tablet (137 mcg total) by mouth every morning. For thyroid hormone replacement      . polyethylene glycol powder (GLYCOLAX/MIRALAX) powder Take 17 g by mouth daily.      . promethazine (PHENERGAN) 25 MG tablet Take 25 mg by mouth 3 (three) times daily as needed. For nausea and/or vimiting      . traMADol (ULTRAM) 50 MG tablet Take 50 mg by mouth 2 (two) times daily.      . traZODone (DESYREL) 100 MG tablet Take 100 mg by mouth at bedtime.      Marland Kitchen  DULoxetine (CYMBALTA) 20 MG capsule Take 1 capsule (20 mg total) by mouth daily. For depression  30 capsule  0  . gabapentin (NEURONTIN) 100 MG capsule Take 2 capsules (200 mg total) by mouth 4 (four) times daily. For anxiety  240 capsule  0    PIR:JJOAC from the symptoms mentioned above,there are no other symptoms referable to all systems reviewed.  Physical Exam: Blood pressure 166/69, pulse 103, temperature 98.2 F (36.8 C), temperature source Oral, resp. rate 21, height 5\' 5"  (1.651 m), weight 70.9 kg (156 lb 4.9 oz), SpO2 97.00%.  GEN: sick looking in his ICU bed, no acute distress  HEENT: slightly dry mucus membranes.  Neck: no goiter nor JVP.  Chest : CTAB  CVS: RRR  Abd: Mild to  moderate  tenderness at lower quadrants.  EXT: No edema.  Skin: no rash, no hyperemia.    Recent Labs  02/19/14 2138  02/20/14 0705 02/20/14 0818  WBC 12.6*  --  16.4*  --   NEUTROABS 11.2*  --  12.9*  --   HGB 15.4*  < > 14.9 14.0  HCT 44.6  < > 43.0 40.5  MCV 90.1  --  89.8  --   PLT 365  --  389  --   < > = values in this interval not displayed.  Recent Labs  02/20/14 0707 02/20/14 0818  NA 142 140  K 4.6 4.2  CL 104 102  CO2 16* 18*  GLUCOSE 198* 163*  BUN 21 20  CREATININE 0.72 0.72  CALCIUM 9.9 9.7    Recent Labs  02/19/14 2138 02/20/14 0705  AST 10 11  ALT 8 7  ALKPHOS 138* 130*  BILITOT 0.2* <0.2*  PROT 8.6* 8.6*  ALBUMIN 4.2 4.1   A1c = No recent a1c in EPIC   Recent Results (from the past 240 hour(s))  MRSA PCR SCREENING     Status: None   Collection Time    02/20/14  1:54 AM      Result Value Ref Range Status   MRSA by PCR NEGATIVE  NEGATIVE Final   Comment:            The GeneXpert MRSA Assay (FDA     approved for NASAL specimens     only), is one component of a     comprehensive MRSA colonization     surveillance program. It is not     intended to diagnose MRSA     infection nor to guide or     monitor treatment for     MRSA infections.    Ct Abdomen Pelvis W Contrast  02/20/2014   CLINICAL DATA:  Abdominal tenderness nausea and vomiting  EXAM: CT ABDOMEN AND PELVIS WITH CONTRAST  TECHNIQUE: Multidetector CT imaging of the abdomen and pelvis was performed using the standard protocol following bolus administration of intravenous contrast.  CONTRAST:  162mL OMNIPAQUE IOHEXOL 300 MG/ML  SOLN  COMPARISON:  04/16/2011  FINDINGS: Lung bases are free of acute infiltrate or sizable effusion.  The liver is well visualized and shows a vague hypodense area adjacent to the falciform ligament likely representing an area of focal fatty infiltration. The spleen, gallbladder, adrenal glands and pancreas are all normal in their CT appearance. The kidneys  are well visualized bilaterally and reveal some mild scarring on the left. No renal calculi or obstructive changes are seen.  The appendix is not visualized consistent with a prior surgical history. The uterus has been surgically removed. The  bladder is well distended. No pelvic mass lesion or sidewall abnormality is noted. The osseous structures show show degenerative change of the thoracolumbar spine.  IMPRESSION: Hypodense lesion within the liver more prominent than that seen on the prior exam likely representing focal fatty infiltration. No other focal abnormality is noted.   Electronically Signed   By: Inez Catalina M.D.   On: 02/20/2014 12:47   Dg Abd Acute W/chest  02/20/2014   CLINICAL DATA:  HEMATEMESIS  EXAM: ACUTE ABDOMEN SERIES (ABDOMEN 2 VIEW & CHEST 1 VIEW)  COMPARISON:  Prior radiograph from 03/02/2013  FINDINGS: The cardiac and mediastinal silhouettes are stable in size and contour, and remain within normal limits.  The lungs are normally inflated. No airspace consolidation, pleural effusion, or pulmonary edema is identified. There is no pneumothorax.  A paucity of gas somewhat limits evaluation of the bowels. No evidence of obstruction or ileus. No soft tissue mass or abnormal calcification. No free intraperitoneal air.  No acute osseous abnormality.  IMPRESSION: 1. Nonobstructive bowel gas pattern with no radiographic evidence of acute intra-abdominal process. 2. No acute cardiopulmonary abnormality.   Electronically Signed   By: Jeannine Boga M.D.   On: 02/20/2014 00:46   Impression:  Principal Problem:   DKA (diabetic ketoacidoses) Active Problems:   CHEST PAIN UNSPECIFIED   Hematemesis   Nausea with vomiting  Plan:  Pt came with severe DKA with wide anion gap acidosis ( AG 33) , still recovering. Her AG is still high at 20.  She does not have recent a1c in the EPIC, I will order one. She is undergoing GI work up for intractable N/V, she  did not start oral nutrition yet. I  will keep her on the insulin drip and dextrose drip for now. She is at extremely high risk for acute and chronic complications of type 1 DM. She  is counseled briefly about long term management of type 1 DM with strict basal/bolus insulin associated with monitoring of BG ac and HS. She will need more education as we follow. I will follow and assist in transitioning her to [facility] insulin. I have given her my contacts for out patient f/u in 1 week. The risk of complications from uncontrolled type 1 DM were discussed briefly with her.  Gebreselassie Nida 02/20/2014, 1:36 PM

## 2014-02-21 ENCOUNTER — Encounter (HOSPITAL_COMMUNITY): Payer: Self-pay | Admitting: *Deleted

## 2014-02-21 ENCOUNTER — Encounter (HOSPITAL_COMMUNITY)
Admission: EM | Disposition: A | Discharge status: Home Health | Payer: Self-pay | Source: Home / Self Care | Attending: Internal Medicine

## 2014-02-21 DIAGNOSIS — K2100 Gastro-esophageal reflux disease with esophagitis, without bleeding: Secondary | ICD-10-CM

## 2014-02-21 HISTORY — PX: ESOPHAGOGASTRODUODENOSCOPY: SHX5428

## 2014-02-21 LAB — BASIC METABOLIC PANEL
BUN: 9 mg/dL (ref 6–23)
BUN: 8 mg/dL (ref 6–23)
BUN: 7 mg/dL (ref 6–23)
BUN: 7 mg/dL (ref 6–23)
BUN: 6 mg/dL (ref 6–23)
BUN: 5 mg/dL — ABNORMAL LOW (ref 6–23)
CALCIUM: 9.3 mg/dL (ref 8.4–10.5)
CALCIUM: 9.2 mg/dL (ref 8.4–10.5)
CHLORIDE: 101 meq/L (ref 96–112)
CHLORIDE: 98 meq/L (ref 96–112)
CO2: 21 meq/L (ref 19–32)
CO2: 21 meq/L (ref 19–32)
CO2: 25 meq/L (ref 19–32)
CO2: 22 meq/L (ref 19–32)
CO2: 24 mEq/L (ref 19–32)
CO2: 27 mEq/L (ref 19–32)
Calcium: 9.5 mg/dL (ref 8.4–10.5)
Calcium: 9 mg/dL (ref 8.4–10.5)
Calcium: 9 mg/dL (ref 8.4–10.5)
Calcium: 8.9 mg/dL (ref 8.4–10.5)
Chloride: 98 mEq/L (ref 96–112)
Chloride: 98 mEq/L (ref 96–112)
Chloride: 98 mEq/L (ref 96–112)
Chloride: 101 mEq/L (ref 96–112)
Creatinine, Ser: 0.61 mg/dL (ref 0.50–1.10)
Creatinine, Ser: 0.61 mg/dL (ref 0.50–1.10)
Creatinine, Ser: 0.6 mg/dL (ref 0.50–1.10)
Creatinine, Ser: 0.6 mg/dL (ref 0.50–1.10)
Creatinine, Ser: 0.58 mg/dL (ref 0.50–1.10)
Creatinine, Ser: 0.56 mg/dL (ref 0.50–1.10)
GFR calc Af Amer: >90 mL/min (ref >90)
GFR calc Af Amer: >90 mL/min (ref >90)
GFR calc Af Amer: >90 mL/min (ref >90)
GFR calc non Af Amer: >90 mL/min (ref >90)
GFR calc non Af Amer: >90 mL/min (ref >90)
GFR calc non Af Amer: >90 mL/min (ref >90)
GFR calc non Af Amer: >90 mL/min (ref >90)
GLUCOSE: 232 mg/dL — AB (ref 70–99)
Glucose, Bld: 104 mg/dL — ABNORMAL HIGH (ref 70–99)
Glucose, Bld: 237 mg/dL — ABNORMAL HIGH (ref 70–99)
Glucose, Bld: 124 mg/dL — ABNORMAL HIGH (ref 70–99)
Glucose, Bld: 211 mg/dL — ABNORMAL HIGH (ref 70–99)
Glucose, Bld: 134 mg/dL — ABNORMAL HIGH (ref 70–99)
POTASSIUM: 2.5 meq/L — AB (ref 3.7–5.3)
Potassium: 3.1 mEq/L — ABNORMAL LOW (ref 3.7–5.3)
Potassium: 3.2 mEq/L — ABNORMAL LOW (ref 3.7–5.3)
Potassium: 3.1 mEq/L — ABNORMAL LOW (ref 3.7–5.3)
Potassium: 3.2 mEq/L — ABNORMAL LOW (ref 3.7–5.3)
Potassium: 3.1 mEq/L — ABNORMAL LOW (ref 3.7–5.3)
SODIUM: 137 meq/L (ref 137–147)
SODIUM: 138 meq/L (ref 137–147)
SODIUM: 138 meq/L (ref 137–147)
SODIUM: 137 meq/L (ref 137–147)
Sodium: 140 mEq/L (ref 137–147)
Sodium: 140 mEq/L (ref 137–147)

## 2014-02-21 LAB — GLUCOSE, CAPILLARY
GLUCOSE-CAPILLARY: 117 mg/dL — AB (ref 70–99)
GLUCOSE-CAPILLARY: 119 mg/dL — AB (ref 70–99)
GLUCOSE-CAPILLARY: 135 mg/dL — AB (ref 70–99)
GLUCOSE-CAPILLARY: 148 mg/dL — AB (ref 70–99)
GLUCOSE-CAPILLARY: 184 mg/dL — AB (ref 70–99)
GLUCOSE-CAPILLARY: 187 mg/dL — AB (ref 70–99)
GLUCOSE-CAPILLARY: 199 mg/dL — AB (ref 70–99)
GLUCOSE-CAPILLARY: 218 mg/dL — AB (ref 70–99)
GLUCOSE-CAPILLARY: 243 mg/dL — AB (ref 70–99)
Glucose-Capillary: 92 mg/dL (ref 70–99)
Glucose-Capillary: 170 mg/dL — ABNORMAL HIGH (ref 70–99)
Glucose-Capillary: 145 mg/dL — ABNORMAL HIGH (ref 70–99)
Glucose-Capillary: 125 mg/dL — ABNORMAL HIGH (ref 70–99)
Glucose-Capillary: 118 mg/dL — ABNORMAL HIGH (ref 70–99)
Glucose-Capillary: 181 mg/dL — ABNORMAL HIGH (ref 70–99)
Glucose-Capillary: 132 mg/dL — ABNORMAL HIGH (ref 70–99)
Glucose-Capillary: 132 mg/dL — ABNORMAL HIGH (ref 70–99)
Glucose-Capillary: 141 mg/dL — ABNORMAL HIGH (ref 70–99)
Glucose-Capillary: 217 mg/dL — ABNORMAL HIGH (ref 70–99)

## 2014-02-21 LAB — KOH PREP

## 2014-02-21 LAB — HEMOGLOBIN A1C
Hgb A1c MFr Bld: 11 % — ABNORMAL HIGH (ref <5.7)
MEAN PLASMA GLUCOSE: 269 mg/dL — AB (ref <117)

## 2014-02-21 LAB — CBC
HCT: 39.1 % (ref 36.0–46.0)
Hemoglobin: 13.9 g/dL (ref 12.0–15.0)
MCH: 31.3 pg (ref 26.0–34.0)
MCHC: 35.5 g/dL (ref 30.0–36.0)
MCV: 88.1 fL (ref 78.0–100.0)
Platelets: 340 10*3/uL (ref 150–400)
RBC: 4.44 MIL/uL (ref 3.87–5.11)
RDW: 13.2 % (ref 11.5–15.5)
WBC: 17.4 10*3/uL — AB (ref 4.0–10.5)

## 2014-02-21 LAB — TSH: TSH: 2.67 u[IU]/mL (ref 0.350–4.500)

## 2014-02-21 LAB — T4, FREE: Free T4: 1.1 ng/dL (ref 0.80–1.80)

## 2014-02-21 SURGERY — EGD (ESOPHAGOGASTRODUODENOSCOPY)
Anesthesia: Moderate Sedation

## 2014-02-21 MED ORDER — MEPERIDINE HCL 100 MG/ML IJ SOLN
INTRAMUSCULAR | Status: AC
  Filled 2014-02-21: qty 2

## 2014-02-21 MED ORDER — LIDOCAINE VISCOUS 2 % MT SOLN
OROMUCOSAL | Status: DC | PRN
  Administered 2014-02-21: 2 mL via OROMUCOSAL

## 2014-02-21 MED ORDER — MORPHINE SULFATE 2 MG/ML IJ SOLN
1.0000 mg | INTRAMUSCULAR | Status: DC | PRN
  Administered 2014-02-21 – 2014-02-27 (×26): 1 mg via INTRAVENOUS
  Filled 2014-02-21 (×27): qty 1

## 2014-02-21 MED ORDER — METOCLOPRAMIDE HCL 5 MG/ML IJ SOLN
5.0000 mg | Freq: Once | INTRAMUSCULAR | Status: AC
  Administered 2014-02-21: 5 mg via INTRAVENOUS
  Filled 2014-02-21: qty 2

## 2014-02-21 MED ORDER — MIDAZOLAM HCL 5 MG/5ML IJ SOLN
INTRAMUSCULAR | Status: DC | PRN
  Administered 2014-02-21 (×2): 1 mg via INTRAVENOUS
  Administered 2014-02-21: 2 mg via INTRAVENOUS

## 2014-02-21 MED ORDER — PROMETHAZINE HCL 25 MG/ML IJ SOLN
25.0000 mg | INTRAMUSCULAR | Status: AC
  Administered 2014-02-21: 25 mg via INTRAVENOUS

## 2014-02-21 MED ORDER — ONDANSETRON HCL 4 MG/2ML IJ SOLN
INTRAMUSCULAR | Status: DC | PRN
  Administered 2014-02-21: 4 mg via INTRAVENOUS

## 2014-02-21 MED ORDER — NYSTATIN 100000 UNIT/GM EX POWD
Freq: Three times a day (TID) | CUTANEOUS | Status: DC
  Administered 2014-02-22 – 2014-02-27 (×16): via TOPICAL
  Filled 2014-02-21: qty 15

## 2014-02-21 MED ORDER — ONDANSETRON HCL 4 MG/2ML IJ SOLN
INTRAMUSCULAR | Status: AC
  Filled 2014-02-21: qty 2

## 2014-02-21 MED ORDER — GI COCKTAIL ~~LOC~~
30.0000 mL | Freq: Once | ORAL | Status: AC
  Administered 2014-02-21: 30 mL via ORAL
  Filled 2014-02-21: qty 30

## 2014-02-21 MED ORDER — PANTOPRAZOLE SODIUM 40 MG IV SOLR: 40.0000 mg | INTRAVENOUS | Status: DC

## 2014-02-21 MED ORDER — GI COCKTAIL ~~LOC~~
ORAL | Status: AC
  Filled 2014-02-21: qty 30

## 2014-02-21 MED ORDER — MIDAZOLAM HCL 5 MG/5ML IJ SOLN
INTRAMUSCULAR | Status: AC
Start: 2014-02-21 — End: 2014-02-22
  Filled 2014-02-21: qty 10

## 2014-02-21 MED ORDER — METOCLOPRAMIDE HCL 5 MG/ML IJ SOLN
5.0000 mg | Freq: Four times a day (QID) | INTRAMUSCULAR | Status: DC
  Administered 2014-02-21 – 2014-02-24 (×12): 5 mg via INTRAVENOUS
  Filled 2014-02-21 (×12): qty 2

## 2014-02-21 MED ORDER — POTASSIUM CHLORIDE 10 MEQ/100ML IV SOLN
10.0000 meq | INTRAVENOUS | Status: AC
  Administered 2014-02-21 (×2): 10 meq via INTRAVENOUS
  Filled 2014-02-21 (×2): qty 100

## 2014-02-21 MED ORDER — MEPERIDINE HCL 100 MG/ML IJ SOLN
INTRAMUSCULAR | Status: DC | PRN
  Administered 2014-02-21: 50 mg via INTRAVENOUS

## 2014-02-21 MED ORDER — STERILE WATER FOR IRRIGATION IR SOLN
Status: DC | PRN
  Administered 2014-02-21: 17:00:00

## 2014-02-21 MED ORDER — SODIUM CHLORIDE 0.9 % IV SOLN
INTRAVENOUS | Status: DC
  Administered 2014-02-21: 16:00:00 via INTRAVENOUS

## 2014-02-21 MED ORDER — PROMETHAZINE HCL 25 MG/ML IJ SOLN
INTRAMUSCULAR | Status: AC
  Filled 2014-02-21: qty 1

## 2014-02-21 MED ORDER — SODIUM CHLORIDE 0.9 % IJ SOLN
INTRAMUSCULAR | Status: AC
  Filled 2014-02-21: qty 10

## 2014-02-21 NOTE — Progress Notes (Signed)
02/21/14 1827 Patient c/o irritation to perineal area, stated "i think I'm getting a yeast infection". Redness and irritation noted to perineal/vaginal area. Notified Dr. Legrand Rams. Order received for nystatin powder applied to area TID. Donavan Foil, RN

## 2014-02-21 NOTE — Progress Notes (Signed)
Subjective: Patient complains of nausea and vomiting. She has coffee ground vomiting. No fever or chills. Patient is still on insulin drip.  Objective: Vital signs in last 24 hours: Temp:  [98 F (36.7 C)-99.2 F (37.3 C)] 99 F (37.2 C) (06/02 0730) Pulse Rate:  [88-106] 88 (06/02 0754) Resp:  [15-29] 20 (06/02 0754) BP: (141-180)/(59-82) 141/67 mmHg (06/02 0754) SpO2:  [94 %-99 %] 97 % (06/02 0754) Weight:  [74.1 kg (163 lb 5.8 oz)] 74.1 kg (163 lb 5.8 oz) (06/02 0500) Weight change: -0.29 kg (-10.2 oz) Last BM Date: 02/19/14  Intake/Output from previous day: 06/01 0701 - 06/02 0700 In: 1323 [I.V.:1323] Out: 2100 [Urine:2100]  PHYSICAL EXAM General appearance: alert and no distress Resp: clear to auscultation bilaterally Cardio: S1, S2 normal GI: soft, non-tender; bowel sounds normal; no masses,  no organomegaly Extremities: extremities normal, atraumatic, no cyanosis or edema  Lab Results:    @labtest @ ABGS No results found for this basename: PHART, PCO2, PO2ART, TCO2, HCO3,  in the last 72 hours CULTURES Recent Results (from the past 240 hour(s))  MRSA PCR SCREENING     Status: None   Collection Time    02/20/14  1:54 AM      Result Value Ref Range Status   MRSA by PCR NEGATIVE  NEGATIVE Final   Comment:            The GeneXpert MRSA Assay (FDA     approved for NASAL specimens     only), is one component of a     comprehensive MRSA colonization     surveillance program. It is not     intended to diagnose MRSA     infection nor to guide or     monitor treatment for     MRSA infections.   Studies/Results: Ct Abdomen Pelvis W Contrast  02/20/2014   CLINICAL DATA:  Abdominal tenderness nausea and vomiting  EXAM: CT ABDOMEN AND PELVIS WITH CONTRAST  TECHNIQUE: Multidetector CT imaging of the abdomen and pelvis was performed using the standard protocol following bolus administration of intravenous contrast.  CONTRAST:  133mL OMNIPAQUE IOHEXOL 300 MG/ML  SOLN   COMPARISON:  04/16/2011  FINDINGS: Lung bases are free of acute infiltrate or sizable effusion.  The liver is well visualized and shows a vague hypodense area adjacent to the falciform ligament likely representing an area of focal fatty infiltration. The spleen, gallbladder, adrenal glands and pancreas are all normal in their CT appearance. The kidneys are well visualized bilaterally and reveal some mild scarring on the left. No renal calculi or obstructive changes are seen.  The appendix is not visualized consistent with a prior surgical history. The uterus has been surgically removed. The bladder is well distended. No pelvic mass lesion or sidewall abnormality is noted. The osseous structures show show degenerative change of the thoracolumbar spine.  IMPRESSION: Hypodense lesion within the liver more prominent than that seen on the prior exam likely representing focal fatty infiltration. No other focal abnormality is noted.   Electronically Signed   By: Inez Catalina M.D.   On: 02/20/2014 12:47   Dg Abd Acute W/chest  02/20/2014   CLINICAL DATA:  HEMATEMESIS  EXAM: ACUTE ABDOMEN SERIES (ABDOMEN 2 VIEW & CHEST 1 VIEW)  COMPARISON:  Prior radiograph from 03/02/2013  FINDINGS: The cardiac and mediastinal silhouettes are stable in size and contour, and remain within normal limits.  The lungs are normally inflated. No airspace consolidation, pleural effusion, or pulmonary edema is identified. There  is no pneumothorax.  A paucity of gas somewhat limits evaluation of the bowels. No evidence of obstruction or ileus. No soft tissue mass or abnormal calcification. No free intraperitoneal air.  No acute osseous abnormality.  IMPRESSION: 1. Nonobstructive bowel gas pattern with no radiographic evidence of acute intra-abdominal process. 2. No acute cardiopulmonary abnormality.   Electronically Signed   By: Jeannine Boga M.D.   On: 02/20/2014 00:46    Medications: I have reviewed the patient's current  medications.  Assesment: Principal Problem:   DKA (diabetic ketoacidoses) Active Problems:   CHEST PAIN UNSPECIFIED   Hematemesis   Nausea with vomiting    Plan: Medications reviewed Will continue insulin therapy Continue IV hydration Will monitor CBC/BMP Endocrine and GI consult appreciated.    LOS: 2 days   Glenford Garis 02/21/2014, 8:37 AM

## 2014-02-21 NOTE — Progress Notes (Signed)
Subjective: Continues to complain of epigastric pain, N/V. Reports coffee ground emesis; however, nursing staff denies this, no documentation of this.   Objective: Vital signs in last 24 hours: Temp:  [98 F (36.7 C)-99.2 F (37.3 C)] 99 F (37.2 C) (06/02 0730) Pulse Rate:  [88-106] 88 (06/02 0754) Resp:  [15-29] 20 (06/02 0754) BP: (141-180)/(59-82) 141/67 mmHg (06/02 0754) SpO2:  [94 %-99 %] 97 % (06/02 0754) Weight:  [163 lb 5.8 oz (74.1 kg)] 163 lb 5.8 oz (74.1 kg) (06/02 0500) Last BM Date: 02/19/14 General:   Alert and oriented, pleasant Head:  Normocephalic and atraumatic. Abdomen:  Bowel sounds present, soft, TTP epigastric and lower abdomen, non-distended. No HSM  Msk:  Symmetrical without gross deformities. Normal posture. Extremities:  Without clubbing or edema. Neurologic:  Alert and  oriented x4;  grossly normal neurologically. Psych:  Alert and cooperative. Normal mood and affect.  Intake/Output from previous day: 06/01 0701 - 06/02 0700 In: 1323 [I.V.:1323] Out: 2100 [Urine:2100] Intake/Output this shift:    Lab Results:  Recent Labs  02/19/14 2138  02/20/14 0705 02/20/14 0818 02/20/14 1850 02/21/14 0221  WBC 12.6*  --  16.4*  --   --  17.4*  HGB 15.4*  < > 14.9 14.0 13.4 13.9  HCT 44.6  < > 43.0 40.5 38.6 39.1  PLT 365  --  389  --   --  340  < > = values in this interval not displayed. BMET  Recent Labs  02/20/14 2225 02/21/14 0221 02/21/14 0702  NA 138 140 137  K 3.4* 3.1* 3.2*  CL 98 101 98  CO2 20 21 21   GLUCOSE 249* 104* 237*  BUN 12 9 8   CREATININE 0.67 0.61 0.61  CALCIUM 9.4 9.5 9.0   LFT  Recent Labs  02/19/14 2138 02/20/14 0705  PROT 8.6* 8.6*  ALBUMIN 4.2 4.1  AST 10 11  ALT 8 7  ALKPHOS 138* 130*  BILITOT 0.2* <0.2*  BILIDIR  --  <0.2  IBILI  --  NOT CALCULATED     Studies/Results: Ct Abdomen Pelvis W Contrast  02/20/2014   CLINICAL DATA:  Abdominal tenderness nausea and vomiting  EXAM: CT ABDOMEN AND  PELVIS WITH CONTRAST  TECHNIQUE: Multidetector CT imaging of the abdomen and pelvis was performed using the standard protocol following bolus administration of intravenous contrast.  CONTRAST:  165mL OMNIPAQUE IOHEXOL 300 MG/ML  SOLN  COMPARISON:  04/16/2011  FINDINGS: Lung bases are free of acute infiltrate or sizable effusion.  The liver is well visualized and shows a vague hypodense area adjacent to the falciform ligament likely representing an area of focal fatty infiltration. The spleen, gallbladder, adrenal glands and pancreas are all normal in their CT appearance. The kidneys are well visualized bilaterally and reveal some mild scarring on the left. No renal calculi or obstructive changes are seen.  The appendix is not visualized consistent with a prior surgical history. The uterus has been surgically removed. The bladder is well distended. No pelvic mass lesion or sidewall abnormality is noted. The osseous structures show show degenerative change of the thoracolumbar spine.  IMPRESSION: Hypodense lesion within the liver more prominent than that seen on the prior exam likely representing focal fatty infiltration. No other focal abnormality is noted.   Electronically Signed   By: Inez Catalina M.D.   On: 02/20/2014 12:47   Dg Abd Acute W/chest  02/20/2014   CLINICAL DATA:  HEMATEMESIS  EXAM: ACUTE ABDOMEN SERIES (ABDOMEN 2 VIEW &  CHEST 1 VIEW)  COMPARISON:  Prior radiograph from 03/02/2013  FINDINGS: The cardiac and mediastinal silhouettes are stable in size and contour, and remain within normal limits.  The lungs are normally inflated. No airspace consolidation, pleural effusion, or pulmonary edema is identified. There is no pneumothorax.  A paucity of gas somewhat limits evaluation of the bowels. No evidence of obstruction or ileus. No soft tissue mass or abnormal calcification. No free intraperitoneal air.  No acute osseous abnormality.  IMPRESSION: 1. Nonobstructive bowel gas pattern with no radiographic  evidence of acute intra-abdominal process. 2. No acute cardiopulmonary abnormality.   Electronically Signed   By: Jeannine Boga M.D.   On: 02/20/2014 00:46    Assessment: Admitted with DKA, reported coffee-ground emesis but no change in Hgb. Persistent abdominal pain, N/V reported. CT without acute findings. Would proceed with EGD today with Dr. Gala Romney, with Phenergan 25 mg IV on call due to polypharmacy. Remain NPO for now.   Hypokalemia: will need replacement prior to EGD. Per attending. KCl 10 meq IV X 2 doses has been ordered.   Plan: Remain NPO EGD with Dr. Gala Romney today with Phenergan 25 mg IV on call Potassium replacement prior to EGD per attending.    Orvil Feil, ANP-BC [facility]    LOS: 2 days    02/21/2014, 8:06 AM

## 2014-02-21 NOTE — Progress Notes (Signed)
02/21/14 1147 Patient c/o epigastric and abdominal pain this morning, c/o persistent nausea. Not yet time for zofran as ordered PRN nausea/vomiting. Pt unable to take po medications due to nausea. NPO for EGD later today. Pt stated "can I have something through my IV for this pain. They gave me reglan yesterday for nausea and it helped".  Notified Dr. Legrand Rams. Orders received for morphine 1 mg IV every 4 hours PRN pain (while unable to take po medications) and reglan 5 mg IV x 1 dose for nausea. Donavan Foil, RN

## 2014-02-21 NOTE — Progress Notes (Deleted)
Hypoglycemic Event  CBG: 65  Treatment: 4oz coke, graham crackers w/peanut butter per pt request  Symptoms:headache  Follow-up CBG: Time:0330 CBG Result:92  Possible Reasons for Event: Unknown  Comments/MD notified:No    [name]  Remember to initiate Hypoglycemia Order Set & complete

## 2014-02-21 NOTE — Progress Notes (Addendum)
CRITICAL VALUE ALERT  Critical value received:  Potassium 2.5  Date of notification:  02/21/14  Time of notification:  2353  Critical value read back:yes  Nurse who received alert:  Harold Barban RN  MD notified (1st page):  Fanta  Time of first page:  2356  MD notified (2nd page): Fanta  Time of second page: 0011  Responding MD: Legrand Rams   Time MD responded:  0030

## 2014-02-21 NOTE — Progress Notes (Signed)
02/21/14 0848 Notified Dr Dorris Fetch of morning CBG: 243. Pt c/o nausea, no vomiting at this time. Remains NPO for EGD this am. Stated patient to remain on IVF (D5 1/2 NS) as ordered since unable to eat, and will need to remain on insulin drip until she after procedure, eating, and CBGs trending down. Donavan Foil, RN

## 2014-02-21 NOTE — Progress Notes (Signed)
Subjective: F/U for DKA and chronically uncontrolled type 1 DM.  She remains in ICU, has had episodes of coffee ground emesis. She will have EGD this AM, kept NPO. Glycemic control is fair, but remains on the insulin drip.  Objective: Vital signs in last 24 hours: Filed Vitals:   02/21/14 0400 02/21/14 0500 02/21/14 0730 02/21/14 0754  BP:    141/67  Pulse:    88  Temp: 98.4 F (36.9 C)  99 F (37.2 C)   TempSrc: Oral  Oral   Resp:    20  Height:      Weight:  74.1 kg (163 lb 5.8 oz)    SpO2:    97%    Intake/Output Summary (Last 24 hours) at 02/21/14 0953 Last data filed at 02/20/14 2200  Gross per 24 hour  Intake 1303.18 ml  Output   2100 ml  Net -796.82 ml   Weight change: -0.29 kg (-10.2 oz)   GEN: sick looking in his ICU bed, no acute distress  HEENT: slightly dry mucus membranes.  Neck: no goiter nor JVP.  Chest : CTAB  CVS: RRR  Abd: has  Moderate epigastric  Tenderness with guarding.  EXT: No edema.  Skin: no rash, no hyperemia.  Lab Results: Basic Metabolic Panel:  Recent Labs  02/21/14 0221 02/21/14 0702  NA 140 137  K 3.1* 3.2*  CL 101 98  CO2 21 21  GLUCOSE 104* 237*  BUN 9 8  CREATININE 0.61 0.61  CALCIUM 9.5 9.0   Liver Function Tests:  Recent Labs  02/19/14 2138 02/20/14 0705  AST 10 11  ALT 8 7  ALKPHOS 138* 130*  BILITOT 0.2* <0.2*  PROT 8.6* 8.6*  ALBUMIN 4.2 4.1    Recent Labs  02/19/14 2331  LIPASE 58   CBC:  Recent Labs  02/19/14 2138  02/20/14 0705  02/20/14 1850 02/21/14 0221  WBC 12.6*  --  16.4*  --   --  17.4*  NEUTROABS 11.2*  --  12.9*  --   --   --   HGB 15.4*  < > 14.9  < > 13.4 13.9  HCT 44.6  < > 43.0  < > 38.6 39.1  MCV 90.1  --  89.8  --   --  88.1  PLT 365  --  389  --   --  340  < > = values in this interval not displayed. Cardiac Enzymes:  Recent Labs  02/20/14 0215 02/20/14 0818 02/20/14 1410  TROPONINI <0.30 <0.30 <0.30   BNP: No components found with this basename: POCBNP,   D-Dimer: No results found for this basename: DDIMER,  in the last 72 hours CBG:  Recent Labs  02/21/14 0329 02/21/14 0425 02/21/14 0528 02/21/14 0649 02/21/14 0744 02/21/14 0850  GLUCAP 119* 135* 170* 199* 243* 184*   Hemoglobin A1C: Pending   Studies/Results: Ct Abdomen Pelvis W Contrast  02/20/2014   CLINICAL DATA:  Abdominal tenderness nausea and vomiting  EXAM: CT ABDOMEN AND PELVIS WITH CONTRAST  TECHNIQUE: Multidetector CT imaging of the abdomen and pelvis was performed using the standard protocol following bolus administration of intravenous contrast.  CONTRAST:  125mL OMNIPAQUE IOHEXOL 300 MG/ML  SOLN  COMPARISON:  04/16/2011  FINDINGS: Lung bases are free of acute infiltrate or sizable effusion.  The liver is well visualized and shows a vague hypodense area adjacent to the falciform ligament likely representing an area of focal fatty infiltration. The spleen, gallbladder, adrenal glands and pancreas are all  normal in their CT appearance. The kidneys are well visualized bilaterally and reveal some mild scarring on the left. No renal calculi or obstructive changes are seen.  The appendix is not visualized consistent with a prior surgical history. The uterus has been surgically removed. The bladder is well distended. No pelvic mass lesion or sidewall abnormality is noted. The osseous structures show show degenerative change of the thoracolumbar spine.  IMPRESSION: Hypodense lesion within the liver more prominent than that seen on the prior exam likely representing focal fatty infiltration. No other focal abnormality is noted.   Electronically Signed   By: Inez Catalina M.D.   On: 02/20/2014 12:47   Dg Abd Acute W/chest  02/20/2014   CLINICAL DATA:  HEMATEMESIS  EXAM: ACUTE ABDOMEN SERIES (ABDOMEN 2 VIEW & CHEST 1 VIEW)  COMPARISON:  Prior radiograph from 03/02/2013  FINDINGS: The cardiac and mediastinal silhouettes are stable in size and contour, and remain within normal limits.  The lungs  are normally inflated. No airspace consolidation, pleural effusion, or pulmonary edema is identified. There is no pneumothorax.  A paucity of gas somewhat limits evaluation of the bowels. No evidence of obstruction or ileus. No soft tissue mass or abnormal calcification. No free intraperitoneal air.  No acute osseous abnormality.  IMPRESSION: 1. Nonobstructive bowel gas pattern with no radiographic evidence of acute intra-abdominal process. 2. No acute cardiopulmonary abnormality.   Electronically Signed   By: Jeannine Boga M.D.   On: 02/20/2014 00:46     Medications:  Scheduled Meds: . escitalopram  10 mg Oral Daily  . levothyroxine  137 mcg Oral QAC breakfast  . lisinopril  20 mg Oral Daily  . nicotine  21 mg Transdermal Daily  . pantoprazole (PROTONIX) IV  40 mg Intravenous Q12H  . potassium chloride  10 mEq Intravenous Q1 Hr x 2  . promethazine  25 mg Intravenous On Call  . traMADol  50 mg Oral BID  . traZODone  100 mg Oral QHS   Continuous Infusions: . sodium chloride Stopped (02/20/14 0449)  . dextrose 5 % and 0.45% NaCl 75 mL/hr at 02/21/14 0651  . insulin (NOVOLIN-R) infusion 5.5 Units/hr (02/21/14 0751)   PRN Meds:.albuterol, dextrose, ondansetron (ZOFRAN) IV  Assessment/Plan: Principal Problem:   DKA (diabetic ketoacidoses) Active Problems:   CHEST PAIN UNSPECIFIED   Hematemesis   Nausea with vomiting Hypothyroidism  Plan:  Pt came with severe DKA with wide anion gap acidosis ( AG 33) , which has largely resolved. However , she has intractable N/V  with episode of coffee ground material. She will be having upper GI study, and kept NPO.  I will keep her on the insulin drip and dextrose support  for now.  She is at extremely high risk for acute and chronic complications of type 1 DM. She is counseled briefly about long term management of type 1 DM with strict basal/bolus insulin associated with monitoring of BG ac and HS. She will need more education as we follow.   Her a1c and TFTs  Are pending. Continue levothyroxine 137 mcg po daily. I will follow and assist in transitioning her to [facility] insulin. I have given her my contacts for out patient f/u in 1 week.  The risk of complications from uncontrolled type 1 DM were discussed briefly with her.    LOS: 2 days   Donata Reddick 02/21/2014, 9:53 AM

## 2014-02-21 NOTE — Op Note (Signed)
[place], [id number]   ENDOSCOPY PROCEDURE REPORT  PATIENT: [name], [name].  MR#: [id number] BIRTHDATE: December 23, 1960 , 52  yrs. old GENDER: Female ENDOSCOPIST: R.  Garfield Cornea, MD FACP FACG REFERRED BY:  Conni Slipper, M.D. PROCEDURE DATE:  02/21/2014 PROCEDURE:     EGD with esophageal brushing and biopsy  INDICATIONS:     Nausea/vomiting/hematemesis/DKA  INFORMED CONSENT:   The risks, benefits, limitations, alternatives and imponderables have been discussed.  The potential for biopsy, esophogeal dilation, etc. have also been reviewed.  Questions have been answered.  All parties agreeable.  Please see the history and physical in the medical record for more information.  MEDICATIONS:       Versed 4 mg IV and Demerol 50 mg IV in divided doses. Xylocaine gel orally Zofran 4 mg IV  DESCRIPTION OF PROCEDURE:   The [id number] ([id number])  endoscope was introduced through the mouth and advanced to the second portion of the duodenum without difficulty or limitations.  The mucosal surfaces were surveyed very carefully during advancement of the scope and upon withdrawal.  Retroflexion view of the proximal stomach and esophagogastric junction was performed.      FINDINGS: Exuberant inflammatory changes involving the distal one half of the tubular esophagus;  patient had linear erosions excoriations and exudates coming up in a four-quadrant distribution halfway up the tubular esophagus. Please see above photos.  Stomach empty. Normal gastric mucosa. Patent pylorus. Normal first and second portion of the duodenum  THERAPEUTIC / DIAGNOSTIC MANEUVERS PERFORMED:  Biopsies the abnormal esophagus as well as a KOH brushing taken.   COMPLICATIONS:  None  IMPRESSION:    Severe exudative reflux esophagitis-rule out Candida esophagitis.  RECOMMENDATIONS:  Follow pending studies.  Continue twice a day PPI therapy. Add Reglan 5 mg IV 4 times a day  to her regimen. I discussed the risks and benefits of this approach prior to sedation. She is agreeable. Clear liquid diet.    _______________________________ R. Garfield Cornea, MD FACP [facility] eSigned:  R. Garfield Cornea, MD FACP [facility] 02/21/2014 5:13 PM     CC:  PATIENT NAME:  [name], [name]. MR#: [id number]

## 2014-02-22 ENCOUNTER — Inpatient Hospital Stay (HOSPITAL_COMMUNITY): Payer: Medicare Other

## 2014-02-22 ENCOUNTER — Encounter (HOSPITAL_COMMUNITY): Payer: Self-pay | Admitting: Internal Medicine

## 2014-02-22 DIAGNOSIS — R1013 Epigastric pain: Secondary | ICD-10-CM

## 2014-02-22 LAB — BASIC METABOLIC PANEL
BUN: 5 mg/dL — ABNORMAL LOW (ref 6–23)
BUN: 5 mg/dL — ABNORMAL LOW (ref 6–23)
BUN: 5 mg/dL — AB (ref 6–23)
BUN: 4 mg/dL — ABNORMAL LOW (ref 6–23)
BUN: 4 mg/dL — ABNORMAL LOW (ref 6–23)
CALCIUM: 8.5 mg/dL (ref 8.4–10.5)
CHLORIDE: 102 meq/L (ref 96–112)
CHLORIDE: 98 meq/L (ref 96–112)
CO2: 27 mEq/L (ref 19–32)
CO2: 26 meq/L (ref 19–32)
CO2: 24 mEq/L (ref 19–32)
CO2: 27 meq/L (ref 19–32)
CO2: 25 meq/L (ref 19–32)
CREATININE: 0.6 mg/dL (ref 0.50–1.10)
CREATININE: 0.54 mg/dL (ref 0.50–1.10)
Calcium: 8.8 mg/dL (ref 8.4–10.5)
Calcium: 8.9 mg/dL (ref 8.4–10.5)
Calcium: 8.7 mg/dL (ref 8.4–10.5)
Calcium: 8.8 mg/dL (ref 8.4–10.5)
Chloride: 104 mEq/L (ref 96–112)
Chloride: 102 mEq/L (ref 96–112)
Chloride: 99 mEq/L (ref 96–112)
Creatinine, Ser: 0.61 mg/dL (ref 0.50–1.10)
Creatinine, Ser: 0.61 mg/dL (ref 0.50–1.10)
Creatinine, Ser: 0.54 mg/dL (ref 0.50–1.10)
GFR calc Af Amer: >90 mL/min (ref >90)
GFR calc Af Amer: >90 mL/min (ref >90)
GFR calc Af Amer: >90 mL/min (ref >90)
GFR calc non Af Amer: >90 mL/min (ref >90)
GFR calc non Af Amer: >90 mL/min (ref >90)
GFR calc non Af Amer: >90 mL/min (ref >90)
GFR calc non Af Amer: >90 mL/min (ref >90)
GLUCOSE: 199 mg/dL — AB (ref 70–99)
GLUCOSE: 239 mg/dL — AB (ref 70–99)
Glucose, Bld: 143 mg/dL — ABNORMAL HIGH (ref 70–99)
Glucose, Bld: 140 mg/dL — ABNORMAL HIGH (ref 70–99)
Glucose, Bld: 129 mg/dL — ABNORMAL HIGH (ref 70–99)
POTASSIUM: 3 meq/L — AB (ref 3.7–5.3)
POTASSIUM: 3.2 meq/L — AB (ref 3.7–5.3)
POTASSIUM: 3.4 meq/L — AB (ref 3.7–5.3)
POTASSIUM: 3.4 meq/L — AB (ref 3.7–5.3)
Potassium: 3.3 mEq/L — ABNORMAL LOW (ref 3.7–5.3)
SODIUM: 142 meq/L (ref 137–147)
SODIUM: 137 meq/L (ref 137–147)
Sodium: 140 mEq/L (ref 137–147)
Sodium: 140 mEq/L (ref 137–147)
Sodium: 137 mEq/L (ref 137–147)

## 2014-02-22 LAB — GLUCOSE, CAPILLARY
GLUCOSE-CAPILLARY: 195 mg/dL — AB (ref 70–99)
GLUCOSE-CAPILLARY: 126 mg/dL — AB (ref 70–99)
GLUCOSE-CAPILLARY: 190 mg/dL — AB (ref 70–99)
GLUCOSE-CAPILLARY: 148 mg/dL — AB (ref 70–99)
GLUCOSE-CAPILLARY: 197 mg/dL — AB (ref 70–99)
GLUCOSE-CAPILLARY: 175 mg/dL — AB (ref 70–99)
Glucose-Capillary: 107 mg/dL — ABNORMAL HIGH (ref 70–99)
Glucose-Capillary: 116 mg/dL — ABNORMAL HIGH (ref 70–99)
Glucose-Capillary: 125 mg/dL — ABNORMAL HIGH (ref 70–99)
Glucose-Capillary: 132 mg/dL — ABNORMAL HIGH (ref 70–99)
Glucose-Capillary: 138 mg/dL — ABNORMAL HIGH (ref 70–99)
Glucose-Capillary: 143 mg/dL — ABNORMAL HIGH (ref 70–99)
Glucose-Capillary: 144 mg/dL — ABNORMAL HIGH (ref 70–99)
Glucose-Capillary: 147 mg/dL — ABNORMAL HIGH (ref 70–99)
Glucose-Capillary: 147 mg/dL — ABNORMAL HIGH (ref 70–99)
Glucose-Capillary: 149 mg/dL — ABNORMAL HIGH (ref 70–99)
Glucose-Capillary: 151 mg/dL — ABNORMAL HIGH (ref 70–99)
Glucose-Capillary: 152 mg/dL — ABNORMAL HIGH (ref 70–99)
Glucose-Capillary: 152 mg/dL — ABNORMAL HIGH (ref 70–99)
Glucose-Capillary: 164 mg/dL — ABNORMAL HIGH (ref 70–99)
Glucose-Capillary: 169 mg/dL — ABNORMAL HIGH (ref 70–99)
Glucose-Capillary: 172 mg/dL — ABNORMAL HIGH (ref 70–99)
Glucose-Capillary: 215 mg/dL — ABNORMAL HIGH (ref 70–99)
Glucose-Capillary: 261 mg/dL — ABNORMAL HIGH (ref 70–99)

## 2014-02-22 LAB — CBC
HCT: 36.2 % (ref 36.0–46.0)
Hemoglobin: 12.6 g/dL (ref 12.0–15.0)
MCH: 31 pg (ref 26.0–34.0)
MCHC: 34.8 g/dL (ref 30.0–36.0)
MCV: 88.9 fL (ref 78.0–100.0)
Platelets: 297 10*3/uL (ref 150–400)
RBC: 4.07 MIL/uL (ref 3.87–5.11)
RDW: 13.2 % (ref 11.5–15.5)
WBC: 10.1 10*3/uL (ref 4.0–10.5)

## 2014-02-22 LAB — HEPATIC FUNCTION PANEL
ALBUMIN: 2.9 g/dL — AB (ref 3.5–5.2)
ALT: 5 U/L (ref 0–35)
AST: 12 U/L (ref 0–37)
Alkaline Phosphatase: 98 U/L (ref 39–117)
Bilirubin, Direct: <0.2 mg/dL (ref 0.0–0.3)
Total Bilirubin: 0.4 mg/dL (ref 0.3–1.2)
Total Protein: 6.4 g/dL (ref 6.0–8.3)

## 2014-02-22 MED ORDER — KCL IN DEXTROSE-NACL 40-5-0.45 MEQ/L-%-% IV SOLN
INTRAVENOUS | Status: AC
  Filled 2014-02-22: qty 1000

## 2014-02-22 MED ORDER — KCL IN DEXTROSE-NACL 40-5-0.45 MEQ/L-%-% IV SOLN
INTRAVENOUS | Status: DC
  Administered 2014-02-22 – 2014-02-23 (×3): via INTRAVENOUS

## 2014-02-22 MED ORDER — INSULIN REGULAR BOLUS VIA INFUSION
1.0000 [IU] | Freq: Three times a day (TID) | INTRAVENOUS | Status: DC
  Administered 2014-02-23 (×3): 0 [IU]/h via INTRAVENOUS
  Administered 2014-02-23: 4.4 [IU]/h via INTRAVENOUS
  Administered 2014-02-23: 1.9 [IU]/h via INTRAVENOUS
  Filled 2014-02-22: qty 10

## 2014-02-22 MED ORDER — SUCRALFATE 1 GM/10ML PO SUSP
1.0000 g | Freq: Three times a day (TID) | ORAL | Status: DC
  Administered 2014-02-22 – 2014-02-27 (×19): 1 g via ORAL
  Filled 2014-02-22 (×19): qty 10

## 2014-02-22 MED ORDER — POTASSIUM CHLORIDE 10 MEQ/100ML IV SOLN
10.0000 meq | INTRAVENOUS | Status: AC
  Administered 2014-02-22 (×3): 10 meq via INTRAVENOUS
  Filled 2014-02-22 (×3): qty 100

## 2014-02-22 MED ORDER — POTASSIUM CHLORIDE 10 MEQ/100ML IV SOLN
10.0000 meq | INTRAVENOUS | Status: AC
  Administered 2014-02-22 (×6): 10 meq via INTRAVENOUS
  Filled 2014-02-22 (×5): qty 100

## 2014-02-22 MED ORDER — FLUCONAZOLE 100 MG PO TABS
100.0000 mg | ORAL_TABLET | Freq: Every day | ORAL | Status: DC
  Administered 2014-02-23 – 2014-02-27 (×5): 100 mg via ORAL
  Filled 2014-02-22 (×5): qty 1

## 2014-02-22 MED ORDER — ONDANSETRON HCL 4 MG/2ML IJ SOLN
4.0000 mg | Freq: Three times a day (TID) | INTRAMUSCULAR | Status: DC
  Administered 2014-02-22 – 2014-02-24 (×10): 4 mg via INTRAVENOUS
  Filled 2014-02-22 (×8): qty 2

## 2014-02-22 MED ORDER — SODIUM CHLORIDE 0.9 % IJ SOLN
10.0000 mL | INTRAMUSCULAR | Status: DC | PRN
  Administered 2014-02-22 (×5): 10 mL via INTRAVENOUS

## 2014-02-22 MED ORDER — FLUCONAZOLE 100 MG PO TABS
200.0000 mg | ORAL_TABLET | Freq: Every day | ORAL | Status: AC
  Administered 2014-02-22: 200 mg via ORAL
  Filled 2014-02-22: qty 2

## 2014-02-22 MED ORDER — POTASSIUM CHLORIDE CRYS ER 20 MEQ PO TBCR
40.0000 meq | EXTENDED_RELEASE_TABLET | Freq: Once | ORAL | Status: AC
  Administered 2014-02-22: 40 meq via ORAL
  Filled 2014-02-22: qty 2

## 2014-02-22 MED ORDER — NYSTATIN 100000 UNIT/GM EX POWD
CUTANEOUS | Status: AC
  Filled 2014-02-22: qty 15

## 2014-02-22 NOTE — Progress Notes (Signed)
Subjective: F/U for DKA and chronically uncontrolled type 1 DM.  She remains in ICU, has had nor more  episodes of coffee ground emesis. EGD shows exudative reflux esophagitis. She will have abdominal ultrasound today. She is kept NPO. Glycemic control is fair, but remains on the insulin drip.  Objective: Vital signs in last 24 hours: Filed Vitals:   02/22/14 0300 02/22/14 0400 02/22/14 0500 02/22/14 0800  BP: 114/57   105/51  Pulse: 94   109  Temp:  99.2 F (37.3 C)  98.3 F (36.8 C)  TempSrc:  Oral  Oral  Resp: 12   15  Height:      Weight:   74.8 kg (164 lb 14.5 oz)   SpO2: 93%   93%    Intake/Output Summary (Last 24 hours) at 02/22/14 0856 Last data filed at 02/22/14 1497  Gross per 24 hour  Intake 1696.05 ml  Output    400 ml  Net 1296.05 ml   Weight change: 0.7 kg (1 lb 8.7 oz)   GEN: sick looking in his ICU bed, no acute distress  HEENT: slightly dry mucus membranes.  Neck: no goiter nor JVP.  Chest : CTAB  CVS: RRR  Abd: has  Moderate epigastric  Tenderness with guarding.  EXT: No edema.  Skin: no rash, no hyperemia.  Lab Results: Basic Metabolic Panel:  Recent Labs  02/22/14 0331 02/22/14 0711  NA 142 140  K 3.0* 3.2*  CL 104 102  CO2 27 26  GLUCOSE 143* 140*  BUN 5* 5*  CREATININE 0.61 0.60  CALCIUM 8.8 8.9   Liver Function Tests:  Recent Labs  02/20/14 0705 02/22/14 0711  AST 11 12  ALT 7 5  ALKPHOS 130* 98  BILITOT <0.2* 0.4  PROT 8.6* 6.4  ALBUMIN 4.1 2.9*    Recent Labs  02/19/14 2331  LIPASE 58   CBC:  Recent Labs  02/19/14 2138  02/20/14 0705  02/21/14 0221 02/22/14 0711  WBC 12.6*  --  16.4*  --  17.4* 10.1  NEUTROABS 11.2*  --  12.9*  --   --   --   HGB 15.4*  < > 14.9  < > 13.9 12.6  HCT 44.6  < > 43.0  < > 39.1 36.2  MCV 90.1  --  89.8  --  88.1 88.9  PLT 365  --  389  --  340 297  < > = values in this interval not displayed. Cardiac Enzymes:  Recent Labs  02/20/14 0215 02/20/14 0818 02/20/14 1410   TROPONINI <0.30 <0.30 <0.30  CBG:  Recent Labs  02/22/14 0300 02/22/14 0422 02/22/14 0535 02/22/14 0635 02/22/14 0735 02/22/14 0845  GLUCAP 147* 151* 195* 138* 126* 172*   Hemoglobin A1C: Pending   Studies/Results: Ct Abdomen Pelvis W Contrast  02/20/2014   CLINICAL DATA:  Abdominal tenderness nausea and vomiting  EXAM: CT ABDOMEN AND PELVIS WITH CONTRAST  TECHNIQUE: Multidetector CT imaging of the abdomen and pelvis was performed using the standard protocol following bolus administration of intravenous contrast.  CONTRAST:  196mL OMNIPAQUE IOHEXOL 300 MG/ML  SOLN  COMPARISON:  04/16/2011  FINDINGS: Lung bases are free of acute infiltrate or sizable effusion.  The liver is well visualized and shows a vague hypodense area adjacent to the falciform ligament likely representing an area of focal fatty infiltration. The spleen, gallbladder, adrenal glands and pancreas are all normal in their CT appearance. The kidneys are well visualized bilaterally and reveal some mild scarring on  the left. No renal calculi or obstructive changes are seen.  The appendix is not visualized consistent with a prior surgical history. The uterus has been surgically removed. The bladder is well distended. No pelvic mass lesion or sidewall abnormality is noted. The osseous structures show show degenerative change of the thoracolumbar spine.  IMPRESSION: Hypodense lesion within the liver more prominent than that seen on the prior exam likely representing focal fatty infiltration. No other focal abnormality is noted.   Electronically Signed   By: Inez Catalina M.D.   On: 02/20/2014 12:47     Medications:  Scheduled Meds: . escitalopram  10 mg Oral Daily  . levothyroxine  137 mcg Oral QAC breakfast  . lisinopril  20 mg Oral Daily  . metoCLOPramide (REGLAN) injection  5 mg Intravenous 4 times per day  . nicotine  21 mg Transdermal Daily  . nystatin   Topical TID  . ondansetron (ZOFRAN) IV  4 mg Intravenous TID WC & HS   . pantoprazole (PROTONIX) IV  40 mg Intravenous Q12H  . potassium chloride  10 mEq Intravenous Q1 Hr x 3  . traMADol  50 mg Oral BID  . traZODone  100 mg Oral QHS   Continuous Infusions: . dextrose 5 % and 0.45 % NaCl with KCl 40 mEq/L 75 mL/hr at 02/22/14 0114  . insulin (NOVOLIN-R) infusion 1.1 Units/hr (02/22/14 0846)   PRN Meds:.albuterol, dextrose, morphine injection, ondansetron (ZOFRAN) IV, sodium chloride  Assessment/Plan: Principal Problem:   DKA (diabetic ketoacidoses) Active Problems:   CHEST PAIN UNSPECIFIED   Hematemesis   Nausea with vomiting Hypothyroidism  Plan:  Her a1c is 11% consistent with chronically uncontrolled diabetes.Pt came with severe DKA with wide anion gap acidosis ( AG 33) , which has resolved. However , she has intractable N/V  with episode of coffee ground material yesterday. EGD showed exudative esophagitis, being prepared for abdominal ultrasound with possibility of HIDA. She is kept NPO.  I will keep her on the insulin drip and dextrose support  for now.  She is at extremely high risk for acute and chronic complications of type 1 DM. She is counseled briefly about long term management of type 1 DM with strict basal/bolus insulin associated with monitoring of BG ac and HS. She will need more education as we follow.   Her TFTs  C/w appropriate thyroid hormone replacement, continue levothyroxine 137 mcg po daily. I will follow and assist in transitioning her to [facility] insulin. I have given her my contacts for out patient f/u in 1 week.  The risk of complications from uncontrolled type 1 DM were discussed briefly with her.    LOS: 3 days   Chirstopher Iovino 02/22/2014, 8:56 AM

## 2014-02-22 NOTE — Progress Notes (Signed)
Subjective: Patient continued to complain of epigastric pain. Claims she couldn't eat due to the pain. EGD showed severe esophagitis.  Objective: Vital signs in last 24 hours: Temp:  [98.6 F (37 C)-99.6 F (37.6 C)] 99.2 F (37.3 C) (06/03 0400) Pulse Rate:  [86-110] 94 (06/03 0300) Resp:  [12-20] 12 (06/03 0300) BP: (106-207)/(51-82) 114/57 mmHg (06/03 0300) SpO2:  [92 %-100 %] 93 % (06/03 0300) Weight:  [74.8 kg (164 lb 14.5 oz)] 74.8 kg (164 lb 14.5 oz) (06/03 0500) Weight change: 0.7 kg (1 lb 8.7 oz) Last BM Date: 02/19/14  Intake/Output from previous day: 06/02 0701 - 06/03 0700 In: 1804.6 [I.V.:1304.6; IV Piggyback:500] Out: 400 [Urine:400]  PHYSICAL EXAM General appearance: alert and no distress Resp: clear to auscultation bilaterally Cardio: S1, S2 normal GI: soft, non-tender; bowel sounds normal; no masses,  no organomegaly Extremities: extremities normal, atraumatic, no cyanosis or edema  Lab Results:    @labtest @ ABGS No results found for this basename: PHART, PCO2, PO2ART, TCO2, HCO3,  in the last 72 hours CULTURES Recent Results (from the past 240 hour(s))  MRSA PCR SCREENING     Status: None   Collection Time    02/20/14  1:54 AM      Result Value Ref Range Status   MRSA by PCR NEGATIVE  NEGATIVE Final   Comment:            The GeneXpert MRSA Assay (FDA     approved for NASAL specimens     only), is one component of a     comprehensive MRSA colonization     surveillance program. It is not     intended to diagnose MRSA     infection nor to guide or     monitor treatment for     MRSA infections.  KOH PREP     Status: None   Collection Time    02/21/14  5:05 PM      Result Value Ref Range Status   Specimen Description ESOPHAGUS BRUSHING   Final   Special Requests NONE   Final   KOH Prep     Final   Value: ABUNDANT WBC PRESENT, PREDOMINANTLY PMN     RARE YEAST WITH PSEUDOHYPHAE     Performed at [facility]   Report Status 02/21/2014  FINAL   Final   Studies/Results: Ct Abdomen Pelvis W Contrast  02/20/2014   CLINICAL DATA:  Abdominal tenderness nausea and vomiting  EXAM: CT ABDOMEN AND PELVIS WITH CONTRAST  TECHNIQUE: Multidetector CT imaging of the abdomen and pelvis was performed using the standard protocol following bolus administration of intravenous contrast.  CONTRAST:  134mL OMNIPAQUE IOHEXOL 300 MG/ML  SOLN  COMPARISON:  04/16/2011  FINDINGS: Lung bases are free of acute infiltrate or sizable effusion.  The liver is well visualized and shows a vague hypodense area adjacent to the falciform ligament likely representing an area of focal fatty infiltration. The spleen, gallbladder, adrenal glands and pancreas are all normal in their CT appearance. The kidneys are well visualized bilaterally and reveal some mild scarring on the left. No renal calculi or obstructive changes are seen.  The appendix is not visualized consistent with a prior surgical history. The uterus has been surgically removed. The bladder is well distended. No pelvic mass lesion or sidewall abnormality is noted. The osseous structures show show degenerative change of the thoracolumbar spine.  IMPRESSION: Hypodense lesion within the liver more prominent than that seen on the prior exam likely representing focal  fatty infiltration. No other focal abnormality is noted.   Electronically Signed   By: Inez Catalina M.D.   On: 02/20/2014 12:47    Medications: I have reviewed the patient's current medications.  Assesment: Principal Problem:   DKA (diabetic ketoacidoses) Active Problems:   CHEST PAIN UNSPECIFIED   Hematemesis   Nausea with vomiting esophagitis hypokalemia   Plan: Medications reviewed Will continue insulin therapy Continue IV hydration KCl supplement Continue PPI Will monitor CBC/BMP     LOS: 3 days   Paidyn Mcferran 02/22/2014, 8:01 AM

## 2014-02-22 NOTE — Progress Notes (Signed)
UR chart review completed.  

## 2014-02-22 NOTE — Progress Notes (Addendum)
Subjective: Complains of constant epigastric pain/RUQ pain, worsened with eating. Underlying nausea is constant, worsened with eating/drinking. Not tolerating oral.   Objective: Vital signs in last 24 hours: Temp:  [98.6 F (37 C)-99.6 F (37.6 C)] 99.2 F (37.3 C) (06/03 0400) Pulse Rate:  [86-110] 94 (06/03 0300) Resp:  [12-20] 12 (06/03 0300) BP: (106-207)/(51-82) 114/57 mmHg (06/03 0300) SpO2:  [92 %-100 %] 93 % (06/03 0300) Weight:  [164 lb 14.5 oz (74.8 kg)] 164 lb 14.5 oz (74.8 kg) (06/03 0500) Last BM Date: 02/19/14 General:   Alert and oriented, pleasant Abdomen:  Bowel sounds present, soft, TTP epigastric and RUQ. No rebound or guarding.  Psych:  Alert and cooperative. Normal mood and affect.  Intake/Output from previous day: 06/02 0701 - 06/03 0700 In: 1804.6 [I.V.:1304.6; IV Piggyback:500] Out: 400 [Urine:400] Intake/Output this shift:    Lab Results:  Recent Labs  02/19/14 2138  02/20/14 0705 02/20/14 0818 02/20/14 1850 02/21/14 0221  WBC 12.6*  --  16.4*  --   --  17.4*  HGB 15.4*  < > 14.9 14.0 13.4 13.9  HCT 44.6  < > 43.0 40.5 38.6 39.1  PLT 365  --  389  --   --  340  < > = values in this interval not displayed. BMET  Recent Labs  02/21/14 2005 02/21/14 2323 02/22/14 0331  NA 137 140 142  K 3.1* 2.5* 3.0*  CL 98 101 104  CO2 24 27 27   GLUCOSE 232* 134* 143*  BUN 6 5* 5*  CREATININE 0.58 0.56 0.61  CALCIUM 9.0 8.9 8.8   LFT  Recent Labs  02/19/14 2138 02/20/14 0705  PROT 8.6* 8.6*  ALBUMIN 4.2 4.1  AST 10 11  ALT 8 7  ALKPHOS 138* 130*  BILITOT 0.2* <0.2*  BILIDIR  --  <0.2  IBILI  --  NOT CALCULATED     Studies/Results: Ct Abdomen Pelvis W Contrast  02/20/2014   CLINICAL DATA:  Abdominal tenderness nausea and vomiting  EXAM: CT ABDOMEN AND PELVIS WITH CONTRAST  TECHNIQUE: Multidetector CT imaging of the abdomen and pelvis was performed using the standard protocol following bolus administration of intravenous contrast.   CONTRAST:  135mL OMNIPAQUE IOHEXOL 300 MG/ML  SOLN  COMPARISON:  04/16/2011  FINDINGS: Lung bases are free of acute infiltrate or sizable effusion.  The liver is well visualized and shows a vague hypodense area adjacent to the falciform ligament likely representing an area of focal fatty infiltration. The spleen, gallbladder, adrenal glands and pancreas are all normal in their CT appearance. The kidneys are well visualized bilaterally and reveal some mild scarring on the left. No renal calculi or obstructive changes are seen.  The appendix is not visualized consistent with a prior surgical history. The uterus has been surgically removed. The bladder is well distended. No pelvic mass lesion or sidewall abnormality is noted. The osseous structures show show degenerative change of the thoracolumbar spine.  IMPRESSION: Hypodense lesion within the liver more prominent than that seen on the prior exam likely representing focal fatty infiltration. No other focal abnormality is noted.   Electronically Signed   By: Inez Catalina M.D.   On: 02/20/2014 12:47    Assessment: Admitted with DKA, reported coffee-ground emesis but no change in Hgb. History of chronic abdominal pain. EGD yesterday with severe exudative reflux esophagitis, +candida esophagitis. Persistent abdominal pain, N/V reported despite symptomatic measures. Somewhat out of proportion to studies thus far. Gallbladder remains in situ, although biliary  component to abdominal pain is less likely. Last [place] of abdomen 2 years ago unremarkable. Persistent N/V likely secondary to unmasked delayed gastric emptying in the setting of poorly controlled diabetes.     Plan: Reglan 5 mg IV QID Add Zofran scheduled with meals and at bedtime Protonix BID Diflucan for candida esophagitis [place] of abdomen today. Consider HIDA if necessary.  Recheck CBC, LFTs now   Orvil Feil, ANP-BC [facility]    LOS: 3 days    02/22/2014, 8:09 AM    Attending  note:  Agree with above Patient seen and examined this evening in the ICU. Remains on an insulin drip. Patient states epigastric pain maybe a little better this evening. Right upper quadrant ultrasound essentially negative.   CBC normal. LFTs okay except for albumin 2.9 Suggest we continue current regimen. Will reassess tomorrow morning.

## 2014-02-22 NOTE — Care Management Note (Addendum)
    Page 1 of 1   02/24/2014     3:10:34 PM CARE MANAGEMENT NOTE 02/24/2014  Patient:  [name], [name]   Account Number:  [id number]  Date Initiated:  02/22/2014  Documentation initiated by:  Theophilus Kinds  Subjective/Objective Assessment:   Pt admitted from home with DKA. Pt lives with her brother and will return home at discharge. Pt is indepdent with ADL's.     Action/Plan:   No CM needs noted.   Anticipated DC Date:  02/24/2014   Anticipated DC Plan:  HOME/SELF CARE         Choice offered to / List presented to:             Status of service:  Completed, signed off Medicare Important Message given?  YES (If response is "NO", the following Medicare IM given date fields will be blank) Date Medicare IM given:  02/24/2014 Date Additional Medicare IM given:    Discharge Disposition:  HOME/SELF CARE  Per UR Regulation:    If discussed at Long Length of Stay Meetings, dates discussed:    Comments:  02/22/14 [place], RN BSN CM

## 2014-02-23 ENCOUNTER — Encounter: Payer: Self-pay | Admitting: Internal Medicine

## 2014-02-23 DIAGNOSIS — K208 Other esophagitis without bleeding: Secondary | ICD-10-CM

## 2014-02-23 DIAGNOSIS — B3781 Candidal esophagitis: Secondary | ICD-10-CM

## 2014-02-23 LAB — BASIC METABOLIC PANEL
BUN: 3 mg/dL — AB (ref 6–23)
BUN: 3 mg/dL — AB (ref 6–23)
BUN: 3 mg/dL — ABNORMAL LOW (ref 6–23)
BUN: 3 mg/dL — ABNORMAL LOW (ref 6–23)
BUN: 4 mg/dL — ABNORMAL LOW (ref 6–23)
CALCIUM: 8.6 mg/dL (ref 8.4–10.5)
CALCIUM: 8.7 mg/dL (ref 8.4–10.5)
CALCIUM: 8.8 mg/dL (ref 8.4–10.5)
CHLORIDE: 100 meq/L (ref 96–112)
CO2: 25 mEq/L (ref 19–32)
CO2: 26 mEq/L (ref 19–32)
CO2: 28 mEq/L (ref 19–32)
CO2: 29 meq/L (ref 19–32)
CO2: 30 mEq/L (ref 19–32)
CREATININE: 0.54 mg/dL (ref 0.50–1.10)
CREATININE: 0.54 mg/dL (ref 0.50–1.10)
Calcium: 8.8 mg/dL (ref 8.4–10.5)
Calcium: 8.8 mg/dL (ref 8.4–10.5)
Chloride: 106 mEq/L (ref 96–112)
Chloride: 103 mEq/L (ref 96–112)
Chloride: 104 mEq/L (ref 96–112)
Chloride: 104 mEq/L (ref 96–112)
Creatinine, Ser: 0.55 mg/dL (ref 0.50–1.10)
Creatinine, Ser: 0.57 mg/dL (ref 0.50–1.10)
Creatinine, Ser: 0.61 mg/dL (ref 0.50–1.10)
GFR calc Af Amer: >90 mL/min (ref >90)
GFR calc Af Amer: >90 mL/min (ref >90)
GFR calc Af Amer: >90 mL/min (ref >90)
GFR calc Af Amer: >90 mL/min (ref >90)
GFR calc non Af Amer: >90 mL/min (ref >90)
GFR calc non Af Amer: >90 mL/min (ref >90)
GLUCOSE: 154 mg/dL — AB (ref 70–99)
GLUCOSE: 168 mg/dL — AB (ref 70–99)
GLUCOSE: 219 mg/dL — AB (ref 70–99)
Glucose, Bld: 135 mg/dL — ABNORMAL HIGH (ref 70–99)
Glucose, Bld: 110 mg/dL — ABNORMAL HIGH (ref 70–99)
POTASSIUM: 3.7 meq/L (ref 3.7–5.3)
Potassium: 3.1 mEq/L — ABNORMAL LOW (ref 3.7–5.3)
Potassium: 3.7 mEq/L (ref 3.7–5.3)
Potassium: 3.8 mEq/L (ref 3.7–5.3)
Potassium: 3.8 mEq/L (ref 3.7–5.3)
SODIUM: 140 meq/L (ref 137–147)
SODIUM: 142 meq/L (ref 137–147)
SODIUM: 145 meq/L (ref 137–147)
Sodium: 142 mEq/L (ref 137–147)
Sodium: 143 mEq/L (ref 137–147)

## 2014-02-23 LAB — GLUCOSE, CAPILLARY
GLUCOSE-CAPILLARY: 149 mg/dL — AB (ref 70–99)
GLUCOSE-CAPILLARY: 181 mg/dL — AB (ref 70–99)
GLUCOSE-CAPILLARY: 151 mg/dL — AB (ref 70–99)
GLUCOSE-CAPILLARY: 252 mg/dL — AB (ref 70–99)
GLUCOSE-CAPILLARY: 152 mg/dL — AB (ref 70–99)
GLUCOSE-CAPILLARY: 134 mg/dL — AB (ref 70–99)
Glucose-Capillary: 122 mg/dL — ABNORMAL HIGH (ref 70–99)
Glucose-Capillary: 141 mg/dL — ABNORMAL HIGH (ref 70–99)
Glucose-Capillary: 119 mg/dL — ABNORMAL HIGH (ref 70–99)
Glucose-Capillary: 153 mg/dL — ABNORMAL HIGH (ref 70–99)
Glucose-Capillary: 155 mg/dL — ABNORMAL HIGH (ref 70–99)
Glucose-Capillary: 161 mg/dL — ABNORMAL HIGH (ref 70–99)
Glucose-Capillary: 229 mg/dL — ABNORMAL HIGH (ref 70–99)
Glucose-Capillary: 157 mg/dL — ABNORMAL HIGH (ref 70–99)
Glucose-Capillary: 118 mg/dL — ABNORMAL HIGH (ref 70–99)
Glucose-Capillary: 91 mg/dL (ref 70–99)
Glucose-Capillary: 142 mg/dL — ABNORMAL HIGH (ref 70–99)
Glucose-Capillary: 250 mg/dL — ABNORMAL HIGH (ref 70–99)
Glucose-Capillary: 282 mg/dL — ABNORMAL HIGH (ref 70–99)
Glucose-Capillary: 247 mg/dL — ABNORMAL HIGH (ref 70–99)

## 2014-02-23 MED ORDER — INSULIN GLARGINE 100 UNIT/ML ~~LOC~~ SOLN
20.0000 [IU] | Freq: Every day | SUBCUTANEOUS | Status: DC
  Administered 2014-02-23: 20 [IU] via SUBCUTANEOUS
  Filled 2014-02-23 (×2): qty 0.2

## 2014-02-23 MED ORDER — GLUCERNA SHAKE PO LIQD
237.0000 mL | Freq: Three times a day (TID) | ORAL | Status: DC
  Administered 2014-02-23 – 2014-02-26 (×9): 237 mL via ORAL

## 2014-02-23 MED ORDER — POTASSIUM CHLORIDE IN NACL 40-0.9 MEQ/L-% IV SOLN
INTRAVENOUS | Status: DC
Start: 2014-02-23 — End: 2014-02-27
  Administered 2014-02-23 – 2014-02-26 (×3): via INTRAVENOUS
  Administered 2014-02-26: 75 mL/h via INTRAVENOUS
  Administered 2014-02-27: 04:00:00 via INTRAVENOUS

## 2014-02-23 MED ORDER — INSULIN ASPART 100 UNIT/ML ~~LOC~~ SOLN
0.0000 [IU] | Freq: Three times a day (TID) | SUBCUTANEOUS | Status: DC
  Administered 2014-02-23: 5 [IU] via SUBCUTANEOUS

## 2014-02-23 NOTE — Progress Notes (Signed)
Subjective: F/U for DKA and chronically uncontrolled type 1 DM.  She remains in ICU. She still c/o nausea, but would like to eat. EGD shows exudative reflux esophagitis. Her abdominal ultrasound is reportedly normal.  She  is allowed to try  Full liquid diet. Glycemic control is fair, but remains on the insulin drip.  Objective: Vital signs in last 24 hours: Filed Vitals:   02/23/14 0500 02/23/14 0615 02/23/14 0700 02/23/14 0800  BP: 111/77 109/68 84/52 108/57  Pulse: 95 87 91 90  Temp:    98.6 F (37 C)  TempSrc:    Oral  Resp: 11 16 14 11   Height:      Weight:      SpO2: 92% 92% 89% 94%    Intake/Output Summary (Last 24 hours) at 02/23/14 1039 Last data filed at 02/23/14 0600  Gross per 24 hour  Intake 997.64 ml  Output   1500 ml  Net -502.36 ml   Weight change:    GEN: sick looking in his ICU bed, no acute distress  HEENT: slightly dry mucus membranes.  Neck: no goiter nor JVP.  Chest : CTAB  CVS: RRR  Abd: has  Moderate epigastric  tenderness without guarding.  EXT: No edema.  Skin: no rash, no hyperemia.  Lab Results: Basic Metabolic Panel:  Recent Labs  02/23/14 0431 02/23/14 0721  NA 145 142  K 3.7 3.8  CL 106 103  CO2 29 28  GLUCOSE 135* 168*  BUN 3* 3*  CREATININE 0.57 0.61  CALCIUM 8.8 8.6   Liver Function Tests:  Recent Labs  02/22/14 0711  AST 12  ALT 5  ALKPHOS 98  BILITOT 0.4  PROT 6.4  ALBUMIN 2.9*   No results found for this basename: LIPASE, AMYLASE,  in the last 72 hours CBC:  Recent Labs  02/21/14 0221 02/22/14 0711  WBC 17.4* 10.1  HGB 13.9 12.6  HCT 39.1 36.2  MCV 88.1 88.9  PLT 340 297   Cardiac Enzymes:  Recent Labs  02/20/14 1410  TROPONINI <0.30  CBG:  Recent Labs  02/23/14 0459 02/23/14 0610 02/23/14 0650 02/23/14 0800 02/23/14 0854 02/23/14 0956  GLUCAP 119* 151* 153* 155* 161* 252*   Hemoglobin A1C: Pending   Studies/Results: US Abdomen Limited Ruq  02/22/2014   CLINICAL DATA:  RUQ pain   EXAM: US ABDOMEN LIMITED - RIGHT UPPER QUADRANT  COMPARISON:  None.  FINDINGS: Gallbladder:  No gallstones or wall thickening visualized, measuring 2.2 mm. No sonographic Murphy sign noted.  Common bile duct:  Diameter: 6.1 mm  Liver:  No focal lesion identified.  Heterogeneous echogenic architecture.  IMPRESSION: Common bile duct within the upper limits of normal.  Hepatic steatosis  Otherwise unremarkable   Electronically Signed   By: Margaree Mackintosh M.D.   On: 02/22/2014 16:07     Medications:  Scheduled Meds: . escitalopram  10 mg Oral Daily  . fluconazole  100 mg Oral Daily  . insulin regular  1-10 Units Intravenous TID AC & HS  . levothyroxine  137 mcg Oral QAC breakfast  . lisinopril  20 mg Oral Daily  . metoCLOPramide (REGLAN) injection  5 mg Intravenous 4 times per day  . nicotine  21 mg Transdermal Daily  . nystatin   Topical TID  . ondansetron (ZOFRAN) IV  4 mg Intravenous TID WC & HS  . pantoprazole (PROTONIX) IV  40 mg Intravenous Q12H  . sucralfate  1 g Oral TID WC & HS  . traMADol  50 mg Oral BID  . traZODone  100 mg Oral QHS   Continuous Infusions: . dextrose 5 % and 0.45 % NaCl with KCl 40 mEq/L 75 mL/hr at 02/23/14 1000  . insulin (NOVOLIN-R) infusion 3.8 mL/hr at 02/23/14 1005   PRN Meds:.albuterol, dextrose, morphine injection, ondansetron (ZOFRAN) IV, sodium chloride  Assessment/Plan: Principal Problem:   DKA (diabetic ketoacidoses) Active Problems:   CHEST PAIN UNSPECIFIED   Hematemesis   Nausea with vomiting Hypothyroidism  Plan:  Her a1c is 11% consistent with chronically uncontrolled diabetes .Pt came with severe DKA with wide anion gap acidosis ( AG 33) , which has resolved. However , she has intractable Nausea , GI work up with  EGD showed exudative esophagitis. Her  abdominal ultrasound is reportedly unremarkable. She says she is cleared to attempt to advance her diet . She may be started on glucerna.  I will keep her on the insulin drip and dextrose  support  for now.  She is at extremely high risk for acute and chronic complications of type 1 DM. She is counseled briefly about long term management of type 1 DM with strict basal/bolus insulin associated with monitoring of BG ac and HS. She will need more education as we follow.   Her TFTs  C/w appropriate thyroid hormone replacement, continue levothyroxine 137 mcg po daily. I will follow and assist in transitioning her to [facility] insulin. I have given her my contacts for out patient f/u in 1 week.  The risk of complications from uncontrolled type 1 DM were discussed briefly with her.    LOS: 4 days   Berenis Corter 02/23/2014, 10:39 AM

## 2014-02-23 NOTE — Progress Notes (Signed)
Insulin drip was stopped at 1930, two hours after lantus was given per MD order. Pt's blood sugar was checked and covered with novolog according to sliding scale. Vital signs are stable. Pt is alert and oriented. Report was given to RN and pt was transferred without telemetry due to med-surg status.

## 2014-02-23 NOTE — Progress Notes (Signed)
Subjective:  Complains of severe odynophagia. No longer vomiting. Burns with water. Substernal chest pain. +flatus. No BM.  Objective: Vital signs in last 24 hours: Temp:  [97.9 F (36.6 C)-98.6 F (37 C)] 98.6 F (37 C) (06/04 0800) Pulse Rate:  [73-100] 90 (06/04 0800) Resp:  [11-28] 11 (06/04 0800) BP: (84-167)/(44-82) 108/57 mmHg (06/04 0800) SpO2:  [89 %-98 %] 94 % (06/04 0800) Last BM Date: 02/19/14 General:   Alert,  Well-developed, well-nourished, pleasant and cooperative in NAD Head:  Normocephalic and atraumatic. Eyes:  Sclera clear, no icterus.  Abdomen:  Soft, mild epigastric tenderness and nondistended.  Normal bowel sounds, without guarding, and without rebound.   Extremities:  Without clubbing, deformity or edema. Neurologic:  Alert and  oriented x4;  grossly normal neurologically. Skin:  Intact without significant lesions or rashes. Psych:  Alert and cooperative. Normal mood and affect.  Intake/Output from previous day: 06/03 0701 - 06/04 0700 In: 1423.9 [P.O.:240; I.V.:883.9; IV Piggyback:300] Out: 1900 [Urine:1900] Intake/Output this shift:    Lab Results: CBC  Recent Labs  02/20/14 1850 02/21/14 0221 02/22/14 0711  WBC  --  17.4* 10.1  HGB 13.4 13.9 12.6  HCT 38.6 39.1 36.2  MCV  --  88.1 88.9  PLT  --  340 297    Lab Results  Component Value Date   HGBA1C 11.0* 02/21/2014    BMET  Recent Labs  02/22/14 2348 02/23/14 0431 02/23/14 0721  NA 140 145 142  K 3.1* 3.7 3.8  CL 100 106 103  CO2 26 29 28   GLUCOSE 154* 135* 168*  BUN 3* 3* 3*  CREATININE 0.55 0.57 0.61  CALCIUM 8.8 8.8 8.6   LFTs  Recent Labs  02/22/14 0711  BILITOT 0.4  BILIDIR <0.2  IBILI NOT CALCULATED  ALKPHOS 98  AST 12  ALT 5  PROT 6.4  ALBUMIN 2.9*   No results found for this basename: LIPASE,  in the last 72 hours PT/INR No results found for this basename: LABPROT, INR,  in the last 72 hours    Imaging Studies: Ct Abdomen Pelvis W  Contrast  02/20/2014   CLINICAL DATA:  Abdominal tenderness nausea and vomiting  EXAM: CT ABDOMEN AND PELVIS WITH CONTRAST  TECHNIQUE: Multidetector CT imaging of the abdomen and pelvis was performed using the standard protocol following bolus administration of intravenous contrast.  CONTRAST:  172mL OMNIPAQUE IOHEXOL 300 MG/ML  SOLN  COMPARISON:  04/16/2011  FINDINGS: Lung bases are free of acute infiltrate or sizable effusion.  The liver is well visualized and shows a vague hypodense area adjacent to the falciform ligament likely representing an area of focal fatty infiltration. The spleen, gallbladder, adrenal glands and pancreas are all normal in their CT appearance. The kidneys are well visualized bilaterally and reveal some mild scarring on the left. No renal calculi or obstructive changes are seen.  The appendix is not visualized consistent with a prior surgical history. The uterus has been surgically removed. The bladder is well distended. No pelvic mass lesion or sidewall abnormality is noted. The osseous structures show show degenerative change of the thoracolumbar spine.  IMPRESSION: Hypodense lesion within the liver more prominent than that seen on the prior exam likely representing focal fatty infiltration. No other focal abnormality is noted.   Electronically Signed   By: Inez Catalina M.D.   On: 02/20/2014 12:47   Dg Abd Acute W/chest  02/20/2014   CLINICAL DATA:  HEMATEMESIS  EXAM: ACUTE ABDOMEN SERIES (ABDOMEN 2 VIEW &  CHEST 1 VIEW)  COMPARISON:  Prior radiograph from 03/02/2013  FINDINGS: The cardiac and mediastinal silhouettes are stable in size and contour, and remain within normal limits.  The lungs are normally inflated. No airspace consolidation, pleural effusion, or pulmonary edema is identified. There is no pneumothorax.  A paucity of gas somewhat limits evaluation of the bowels. No evidence of obstruction or ileus. No soft tissue mass or abnormal calcification. No free intraperitoneal air.   No acute osseous abnormality.  IMPRESSION: 1. Nonobstructive bowel gas pattern with no radiographic evidence of acute intra-abdominal process. 2. No acute cardiopulmonary abnormality.   Electronically Signed   By: Jeannine Boga M.D.   On: 02/20/2014 00:46   US Abdomen Limited Ruq  02/22/2014   CLINICAL DATA:  RUQ pain  EXAM: US ABDOMEN LIMITED - RIGHT UPPER QUADRANT  COMPARISON:  None.  FINDINGS: Gallbladder:  No gallstones or wall thickening visualized, measuring 2.2 mm. No sonographic Murphy sign noted.  Common bile duct:  Diameter: 6.1 mm  Liver:  No focal lesion identified.  Heterogeneous echogenic architecture.  IMPRESSION: Common bile duct within the upper limits of normal.  Hepatic steatosis  Otherwise unremarkable   Electronically Signed   By: Margaree Mackintosh M.D.   On: 02/22/2014 16:07  [2 weeks]   Assessment:  Admitted with DKA, reported coffee-ground emesis but no change in Hgb. History of chronic abdominal pain. EGD yesterday with severe exudative reflux esophagitis, +candida esophagitis.  Vomiting finally improved. Mostly symptoms related to severe esophagitis.   RUQ abdominal u/s yesterday with fatty liver. CBD unremarkable.  Plan: 1. Need to sit upright today. She is laying flat in the bed. Discussed with patient and placed at 45 degrees.  2. Continue Reglan, Zofran, Protonix, carafate, diflucan. Need to avoid vomitins.  3. F/U biopsies. 4. Continue clear liquids today.   LOS: 4 days   Mahala Menghini  02/23/2014, 11:01 AM  Attending note:  Patient seen and examined in the ICU earlier today. Agree with above. Patient looks better to me today. May take a couple more days before she realizes significant improvement in dysphagia / odynophagia symptoms given severe inflammatory changes seen at recent EGD. Agree with continuing current regimen for the time being.

## 2014-02-23 NOTE — Progress Notes (Signed)
Subjective: Patient is complaining of nausea and epigastric pain. She is not tolerating po intake. Her ultrasound of the abdomen was normal.  Objective: Vital signs in last 24 hours: Temp:  [97.9 F (36.6 C)-98.6 F (37 C)] 98.6 F (37 C) (06/04 0400) Pulse Rate:  [73-109] 87 (06/04 0615) Resp:  [11-28] 16 (06/04 0615) BP: (97-167)/(44-82) 109/68 mmHg (06/04 0615) SpO2:  [90 %-98 %] 92 % (06/04 0615) Weight change:  Last BM Date: 02/19/14  Intake/Output from previous day: 06/03 0701 - 06/04 0700 In: 1423.9 [P.O.:240; I.V.:883.9; IV Piggyback:300] Out: 1900 [Urine:1900]  PHYSICAL EXAM General appearance: alert and no distress Resp: clear to auscultation bilaterally Cardio: S1, S2 normal GI: soft, non-tender; bowel sounds normal; no masses,  no organomegaly Extremities: extremities normal, atraumatic, no cyanosis or edema  Lab Results:    @labtest @ [facility] No results found for this basename: PHART, PCO2, PO2ART, TCO2, HCO3,  in the last 72 hours CULTURES Recent Results (from the past 240 hour(s))  MRSA PCR SCREENING     Status: None   Collection Time    02/20/14  1:54 AM      Result Value Ref Range Status   MRSA by PCR NEGATIVE  NEGATIVE Final   Comment:            The GeneXpert MRSA Assay (FDA     approved for NASAL specimens     only), is one component of a     comprehensive MRSA colonization     surveillance program. It is not     intended to diagnose MRSA     infection nor to guide or     monitor treatment for     MRSA infections.  KOH PREP     Status: None   Collection Time    02/21/14  5:05 PM      Result Value Ref Range Status   Specimen Description ESOPHAGUS BRUSHING   Final   Special Requests NONE   Final   KOH Prep     Final   Value: ABUNDANT WBC PRESENT, PREDOMINANTLY PMN     RARE YEAST WITH PSEUDOHYPHAE     Performed at [facility]   Report Status 02/21/2014 FINAL   Final   Studies/Results: US Abdomen Limited Ruq  02/22/2014   CLINICAL  DATA:  RUQ pain  EXAM: US ABDOMEN LIMITED - RIGHT UPPER QUADRANT  COMPARISON:  None.  FINDINGS: Gallbladder:  No gallstones or wall thickening visualized, measuring 2.2 mm. No sonographic Murphy sign noted.  Common bile duct:  Diameter: 6.1 mm  Liver:  No focal lesion identified.  Heterogeneous echogenic architecture.  IMPRESSION: Common bile duct within the upper limits of normal.  Hepatic steatosis  Otherwise unremarkable   Electronically Signed   By: Margaree Mackintosh M.D.   On: 02/22/2014 16:07    Medications: I have reviewed the patient's current medications.  Assesment: Principal Problem:   DKA (diabetic ketoacidoses) Active Problems:   CHEST PAIN UNSPECIFIED   Hematemesis   Nausea with vomiting esophagitis hypokalemia   Plan: Medications reviewed Will continue insulin therapy as per endocrine recommendation Continue IV hydration KCl supplement Continue PPI Will monitor CBC/BMP     LOS: 4 days   Zerah Hilyer 02/23/2014, 7:58 AM

## 2014-02-24 LAB — URINE MICROSCOPIC-ADD ON

## 2014-02-24 LAB — BASIC METABOLIC PANEL
BUN: 6 mg/dL (ref 6–23)
CALCIUM: 9.2 mg/dL (ref 8.4–10.5)
CHLORIDE: 101 meq/L (ref 96–112)
CO2: 28 meq/L (ref 19–32)
CREATININE: 0.68 mg/dL (ref 0.50–1.10)
GFR calc Af Amer: >90 mL/min (ref >90)
GFR calc non Af Amer: >90 mL/min (ref >90)
Glucose, Bld: 208 mg/dL — ABNORMAL HIGH (ref 70–99)
Potassium: 4.5 mEq/L (ref 3.7–5.3)
Sodium: 142 mEq/L (ref 137–147)

## 2014-02-24 LAB — URINALYSIS, ROUTINE W REFLEX MICROSCOPIC
Bilirubin Urine: NEGATIVE
Glucose, UA: 250 mg/dL — AB
NITRITE: NEGATIVE
PROTEIN: NEGATIVE mg/dL
Specific Gravity, Urine: 1.01 (ref 1.005–1.030)
Urobilinogen, UA: 0.2 mg/dL (ref 0.0–1.0)
pH: 6.5 (ref 5.0–8.0)

## 2014-02-24 LAB — GLUCOSE, CAPILLARY
GLUCOSE-CAPILLARY: 46 mg/dL — AB (ref 70–99)
GLUCOSE-CAPILLARY: 92 mg/dL (ref 70–99)
Glucose-Capillary: 242 mg/dL — ABNORMAL HIGH (ref 70–99)
Glucose-Capillary: 213 mg/dL — ABNORMAL HIGH (ref 70–99)
Glucose-Capillary: 120 mg/dL — ABNORMAL HIGH (ref 70–99)

## 2014-02-24 MED ORDER — LIDOCAINE VISCOUS 2 % MT SOLN
15.0000 mL | Freq: Four times a day (QID) | OROMUCOSAL | Status: DC | PRN
  Administered 2014-02-24 (×2): 15 mL via OROMUCOSAL
  Filled 2014-02-24 (×3): qty 15

## 2014-02-24 MED ORDER — INSULIN ASPART 100 UNIT/ML ~~LOC~~ SOLN
0.0000 [IU] | Freq: Three times a day (TID) | SUBCUTANEOUS | Status: DC
  Administered 2014-02-25: 4 [IU] via SUBCUTANEOUS
  Administered 2014-02-25 – 2014-02-26 (×2): 2 [IU] via SUBCUTANEOUS
  Administered 2014-02-26: 4 [IU] via SUBCUTANEOUS

## 2014-02-24 MED ORDER — INSULIN ASPART 100 UNIT/ML ~~LOC~~ SOLN
0.0000 [IU] | SUBCUTANEOUS | Status: DC
  Administered 2014-02-24 (×2): 6 [IU] via SUBCUTANEOUS

## 2014-02-24 MED ORDER — DEXTROSE 50 % IV SOLN
25.0000 mL | Freq: Once | INTRAVENOUS | Status: AC | PRN
  Administered 2014-02-24: 25 mL via INTRAVENOUS

## 2014-02-24 MED ORDER — BISACODYL 5 MG PO TBEC
10.0000 mg | DELAYED_RELEASE_TABLET | Freq: Once | ORAL | Status: AC
  Administered 2014-02-24: 10 mg via ORAL
  Filled 2014-02-24: qty 2

## 2014-02-24 MED ORDER — INSULIN GLARGINE 100 UNIT/ML ~~LOC~~ SOLN
30.0000 [IU] | Freq: Every day | SUBCUTANEOUS | Status: DC
  Administered 2014-02-24: 30 [IU] via SUBCUTANEOUS
  Filled 2014-02-24 (×4): qty 0.3

## 2014-02-24 MED ORDER — DEXTROSE 50 % IV SOLN
INTRAVENOUS | Status: AC
  Filled 2014-02-24: qty 50

## 2014-02-24 MED ORDER — ONDANSETRON HCL 4 MG/2ML IJ SOLN
4.0000 mg | Freq: Three times a day (TID) | INTRAMUSCULAR | Status: DC
  Administered 2014-02-24 – 2014-02-27 (×11): 4 mg via INTRAVENOUS
  Filled 2014-02-24 (×10): qty 2

## 2014-02-24 MED ORDER — PANTOPRAZOLE SODIUM 40 MG PO TBEC
40.0000 mg | DELAYED_RELEASE_TABLET | Freq: Two times a day (BID) | ORAL | Status: DC
  Administered 2014-02-24 – 2014-02-27 (×6): 40 mg via ORAL
  Filled 2014-02-24 (×6): qty 1

## 2014-02-24 MED ORDER — METOCLOPRAMIDE HCL 5 MG/ML IJ SOLN
5.0000 mg | Freq: Three times a day (TID) | INTRAMUSCULAR | Status: DC
  Administered 2014-02-24 – 2014-02-27 (×11): 5 mg via INTRAVENOUS
  Filled 2014-02-24 (×11): qty 2

## 2014-02-24 MED ORDER — INSULIN GLARGINE 100 UNIT/ML ~~LOC~~ SOLN
20.0000 [IU] | Freq: Every day | SUBCUTANEOUS | Status: DC
  Administered 2014-02-25 – 2014-02-26 (×2): 20 [IU] via SUBCUTANEOUS
  Filled 2014-02-24 (×3): qty 0.2

## 2014-02-24 NOTE — Plan of Care (Signed)
Problem: Phase II Progression Outcomes Goal: CBGs stable on SQ insulin Outcome: Progressing Changes and adjustments were made to the insulin regimen. Goal: Tolerating PO clear liquid diet Outcome: Progressing Patient states that she wants to eat.  Her painful esophagus inhibits her from eating as she wants.  New orders were given for the pain.

## 2014-02-24 NOTE — Progress Notes (Signed)
Notified Dr. Dorris Fetch of the patients CBG for dinner 42.  Voiced to him that patient is now on a full liquid diet, but not eating a lot due to her esophagus hurting.  Her CBG's through out the day day was greater than 200.  New orders given and followed.

## 2014-02-24 NOTE — Progress Notes (Signed)
Subjective:  Patient complains of feeling constipated. Last bowel movement May 31 although she has not been eating any solid food. Continues to have severe odynophagia even with water. Not interested in an advancing diet. Vomiting resolved.  Objective: Vital signs in last 24 hours: Temp:  [98 F (36.7 C)-98.9 F (37.2 C)] 98 F (36.7 C) (06/05 0448) Pulse Rate:  [83-103] 103 (06/05 0448) Resp:  [10-23] 14 (06/05 0448) BP: (90-157)/(47-69) 103/68 mmHg (06/05 0448) SpO2:  [89 %-96 %] 96 % (06/05 0448) Weight:  [159 lb 6.4 oz (72.303 kg)] 159 lb 6.4 oz (72.303 kg) (06/04 2110) Last BM Date: 02/19/14 General:   Alert,  Well-developed, well-nourished, pleasant and cooperative in NAD Head:  Normocephalic and atraumatic. Eyes:  Sclera clear, no icterus.  Abdomen:  Soft, nontender and nondistended. Normal bowel sounds, without guarding, and without rebound.   Extremities:  Without clubbing, deformity or edema. Neurologic:  Alert and  oriented x4;  grossly normal neurologically. Skin:  Intact without significant lesions or rashes. Psych:  Alert and cooperative. Normal mood and affect.  Intake/Output from previous day: 06/04 0701 - 06/05 0700 In: 1645 [I.V.:1645] Out: 400 [Urine:400] Intake/Output this shift:    Lab Results: CBC  Recent Labs  02/22/14 0711  WBC 10.1  HGB 12.6  HCT 36.2  MCV 88.9  PLT 297   BMET  Recent Labs  02/23/14 1104 02/23/14 1505 02/24/14 0540  NA 142 143 142  K 3.8 3.7 4.5  CL 104 104 101  CO2 25 30 28   GLUCOSE 219* 110* 208*  BUN 4* 3* 6  CREATININE 0.54 0.54 0.68  CALCIUM 8.7 8.8 9.2   LFTs  Recent Labs  02/22/14 0711  BILITOT 0.4  BILIDIR <0.2  IBILI NOT CALCULATED  ALKPHOS 98  AST 12  ALT 5  PROT 6.4  ALBUMIN 2.9*   No results found for this basename: LIPASE,  in the last 72 hours PT/INR No results found for this basename: LABPROT, INR,  in the last 72 hours    Imaging Studies: Ct Abdomen Pelvis W  Contrast  02/20/2014   CLINICAL DATA:  Abdominal tenderness nausea and vomiting  EXAM: CT ABDOMEN AND PELVIS WITH CONTRAST  TECHNIQUE: Multidetector CT imaging of the abdomen and pelvis was performed using the standard protocol following bolus administration of intravenous contrast.  CONTRAST:  117mL OMNIPAQUE IOHEXOL 300 MG/ML  SOLN  COMPARISON:  04/16/2011  FINDINGS: Lung bases are free of acute infiltrate or sizable effusion.  The liver is well visualized and shows a vague hypodense area adjacent to the falciform ligament likely representing an area of focal fatty infiltration. The spleen, gallbladder, adrenal glands and pancreas are all normal in their CT appearance. The kidneys are well visualized bilaterally and reveal some mild scarring on the left. No renal calculi or obstructive changes are seen.  The appendix is not visualized consistent with a prior surgical history. The uterus has been surgically removed. The bladder is well distended. No pelvic mass lesion or sidewall abnormality is noted. The osseous structures show show degenerative change of the thoracolumbar spine.  IMPRESSION: Hypodense lesion within the liver more prominent than that seen on the prior exam likely representing focal fatty infiltration. No other focal abnormality is noted.   Electronically Signed   By: Inez Catalina M.D.   On: 02/20/2014 12:47      Assessment: Admitted with DKA, reported coffee-ground emesis but no change in Hgb. History of chronic abdominal pain. EGD with  severe exudative reflux esophagitis, +candida esophagitis. Vomiting finally resolve. Mostly symptoms related to severe esophagitis.   RUQ abdominal u/s with fatty liver. CBD unremarkable.  Plan: 1. Followup biopsies 2. Encouraged patient to sit up today. Only getting out of bed to go the bathroom. 3. Continue clear liquids. 4. Continue Reglan, Zofran, recurrence, Carafate, Diflucan. 5. Dulcolax 10 mg today for constipation.   LOS: 5 days    Laureen Ochs. Bobby Rumpf, PA-C 6/5/201511:25 AM

## 2014-02-24 NOTE — Progress Notes (Signed)
Subjective: Patient continued to complain of epigastric pain and nausea/vomiting. She is not able to eat much. She is of insulin drip. Her blood sugar is fl actuating in the range of 200 mg/dl..  Objective: Vital signs in last 24 hours: Temp:  [98 F (36.7 C)-98.9 F (37.2 C)] 98 F (36.7 C) (06/05 0448) Pulse Rate:  [83-103] 103 (06/05 0448) Resp:  [10-23] 14 (06/05 0448) BP: (90-157)/(47-69) 103/68 mmHg (06/05 0448) SpO2:  [89 %-96 %] 96 % (06/05 0448) Weight:  [72.303 kg (159 lb 6.4 oz)] 72.303 kg (159 lb 6.4 oz) (06/04 2110) Weight change:  Last BM Date: 02/19/14  Intake/Output from previous day: 06/04 0701 - 06/05 0700 In: 1645 [I.V.:1645] Out: 400 [Urine:400]  PHYSICAL EXAM General appearance: alert and no distress Resp: clear to auscultation bilaterally Cardio: S1, S2 normal GI: soft, non-tender; bowel sounds normal; no masses,  no organomegaly Extremities: extremities normal, atraumatic, no cyanosis or edema  Lab Results:    @labtest @ [facility] No results found for this basename: PHART, PCO2, PO2ART, TCO2, HCO3,  in the last 72 hours CULTURES Recent Results (from the past 240 hour(s))  MRSA PCR SCREENING     Status: None   Collection Time    02/20/14  1:54 AM      Result Value Ref Range Status   MRSA by PCR NEGATIVE  NEGATIVE Final   Comment:            The GeneXpert MRSA Assay (FDA     approved for NASAL specimens     only), is one component of a     comprehensive MRSA colonization     surveillance program. It is not     intended to diagnose MRSA     infection nor to guide or     monitor treatment for     MRSA infections.  KOH PREP     Status: None   Collection Time    02/21/14  5:05 PM      Result Value Ref Range Status   Specimen Description ESOPHAGUS BRUSHING   Final   Special Requests NONE   Final   KOH Prep     Final   Value: ABUNDANT WBC PRESENT, PREDOMINANTLY PMN     RARE YEAST WITH PSEUDOHYPHAE     Performed at [facility]   Report  Status 02/21/2014 FINAL   Final   Studies/Results: US Abdomen Limited Ruq  02/22/2014   CLINICAL DATA:  RUQ pain  EXAM: US ABDOMEN LIMITED - RIGHT UPPER QUADRANT  COMPARISON:  None.  FINDINGS: Gallbladder:  No gallstones or wall thickening visualized, measuring 2.2 mm. No sonographic Murphy sign noted.  Common bile duct:  Diameter: 6.1 mm  Liver:  No focal lesion identified.  Heterogeneous echogenic architecture.  IMPRESSION: Common bile duct within the upper limits of normal.  Hepatic steatosis  Otherwise unremarkable   Electronically Signed   By: Margaree Mackintosh M.D.   On: 02/22/2014 16:07    Medications: I have reviewed the patient's current medications.  Assesment: Principal Problem:   DKA (diabetic ketoacidoses) Active Problems:   CHEST PAIN UNSPECIFIED   Hematemesis   Nausea with vomiting esophagitis Hypokalemia-resolved   Plan: Medications reviewed Will continue insulin therapy as per endocrine recommendation Continue IV hydration Continue PPI Will monitor CBC/BMP     LOS: 5 days   Stormy Connon 02/24/2014, 7:55 AM

## 2014-02-24 NOTE — Progress Notes (Signed)
Notified Dr. Legrand Rams due to the patients continuing c/o her throat burning the suggestion to the MD was GI cocktail.  The patient also c/o UTI new orders given and followed.    Notified Hart Robinsons, [facility] of the contradiction for GI cocktail due to th e allergy of Phenytoin he suggested/recommended viscous lidocaine. New orders placed.

## 2014-02-24 NOTE — Progress Notes (Signed)
Subjective: F/U for DKA and chronically uncontrolled type 1 DM.  She is out of ICU.  She still c/o nausea, but tolerated liquid diet.  EGD shows exudative reflux esophagitis. Her abdominal ultrasound is reportedly normal. She has had rebound hyperglycemia when insulin drip was stopped.  Objective: Vital signs in last 24 hours: Filed Vitals:   02/23/14 1941 02/23/14 2000 02/23/14 2110 02/24/14 0448  BP:  90/47 102/61 103/68  Pulse:  90 83 103  Temp: 98.3 F (36.8 C)  98.2 F (36.8 C) 98 F (36.7 C)  TempSrc: Oral  Oral Oral  Resp:  10 16 14   Height:   5\' 5"  (1.651 m)   Weight:   72.303 kg (159 lb 6.4 oz)   SpO2:  93% 96% 96%    Intake/Output Summary (Last 24 hours) at 02/24/14 0818 Last data filed at 02/24/14 0631  Gross per 24 hour  Intake   1645 ml  Output    400 ml  Net   1245 ml   Weight change:    GEN: looks better,  no acute distress  HEENT: slightly dry mucus membranes.  Neck: no goiter nor JVP.  Chest : CTAB  CVS: RRR  Abd: has mild  epigastric  tenderness without guarding.  EXT: No edema.  Skin: no rash, no hyperemia.  Lab Results: Basic Metabolic Panel:  Recent Labs  02/23/14 1505 02/24/14 0540  NA 143 142  K 3.7 4.5  CL 104 101  CO2 30 28  GLUCOSE 110* 208*  BUN 3* 6  CREATININE 0.54 0.68  CALCIUM 8.8 9.2   Liver Function Tests:  Recent Labs  02/22/14 0711  AST 12  ALT 5  ALKPHOS 98  BILITOT 0.4  PROT 6.4  ALBUMIN 2.9*   No results found for this basename: LIPASE, AMYLASE,  in the last 72 hours CBC:  Recent Labs  02/22/14 0711  WBC 10.1  HGB 12.6  HCT 36.2  MCV 88.9  PLT 297   Cardiac Enzymes: No results found for this basename: CKTOTAL, CKMB, CKMBINDEX, TROPONINI,  in the last 72 hoursCBG:  Recent Labs  02/23/14 1558 02/23/14 1655 02/23/14 1757 02/23/14 1851 02/23/14 1944 02/24/14 0801  GLUCAP 91 142* 250* 282* 247* 242*   Hemoglobin A1C: Pending   Studies/Results: US Abdomen Limited Ruq  02/22/2014    CLINICAL DATA:  RUQ pain  EXAM: US ABDOMEN LIMITED - RIGHT UPPER QUADRANT  COMPARISON:  None.  FINDINGS: Gallbladder:  No gallstones or wall thickening visualized, measuring 2.2 mm. No sonographic Murphy sign noted.  Common bile duct:  Diameter: 6.1 mm  Liver:  No focal lesion identified.  Heterogeneous echogenic architecture.  IMPRESSION: Common bile duct within the upper limits of normal.  Hepatic steatosis  Otherwise unremarkable   Electronically Signed   By: Margaree Mackintosh M.D.   On: 02/22/2014 16:07     Medications:  Scheduled Meds: . escitalopram  10 mg Oral Daily  . feeding supplement (GLUCERNA SHAKE)  237 mL Oral TID WC  . fluconazole  100 mg Oral Daily  . insulin aspart  0-15 Units Subcutaneous TID WC  . insulin glargine  20 Units Subcutaneous Daily  . levothyroxine  137 mcg Oral QAC breakfast  . lisinopril  20 mg Oral Daily  . metoCLOPramide (REGLAN) injection  5 mg Intravenous 4 times per day  . nicotine  21 mg Transdermal Daily  . nystatin   Topical TID  . ondansetron (ZOFRAN) IV  4 mg Intravenous TID WC & HS  .  pantoprazole (PROTONIX) IV  40 mg Intravenous Q12H  . sucralfate  1 g Oral TID WC & HS  . traMADol  50 mg Oral BID  . traZODone  100 mg Oral QHS   Continuous Infusions: . 0.9 % NaCl with KCl 40 mEq / L 75 mL/hr at 02/24/14 0631   PRN Meds:.albuterol, morphine injection, ondansetron (ZOFRAN) IV, sodium chloride  Assessment/Plan: Principal Problem:   DKA (diabetic ketoacidoses) Active Problems:   CHEST PAIN UNSPECIFIED   Hematemesis   Nausea with vomiting Hypothyroidism  Plan:  Her a1c is 11% consistent with chronically uncontrolled diabetes . Increase basal insulin Lantus to 30 units qhs, and Novolog correction dose, associated with monitoring of BG AC and HS. She will need Novolog premeal when she tolerates solid diet. She is at extremely high risk for acute and chronic complications of type 1 DM. She is counseled briefly about long term management of type  1 DM with strict basal/bolus insulin associated with monitoring of BG ac and HS. She will need more education as we follow.   Her TFTs  C/w appropriate thyroid hormone replacement, continue levothyroxine 137 mcg po daily. I will follow and assist in transitioning her to [facility] insulin. I have given her my contacts for out patient f/u in 1 week.  The risk of complications from uncontrolled type 1 DM were discussed briefly with her.    LOS: 5 days   Navarro Nine 02/24/2014, 8:18 AM

## 2014-02-24 NOTE — Progress Notes (Signed)
REVIEWED. AGREE. ADVANCE TO FULL LIQUIDS.

## 2014-02-24 NOTE — Progress Notes (Signed)
Nutrition Follow-up INTERVENTION:  Glucerna Shake po TID, each supplement provides 220 kcal and 10 grams of protein   NUTRITION DIAGNOSIS: Inadequate oral intake related to diet restrictions and DKA at admission; ongoing  Goal: Pt will tolerate diet advancement to meet >90% of est nutrition needs; not met  Monitor: diet advancement and adequacy of intake  53 y.o. female  Admitting Dx: DKA (diabetic ketoacidoses)  ASSESSMENT: Pt still very nauseated today and says that she has been "vomiting blood". She is s/p EGD today.  Pt prefers that I return later for her to discuss her nutrition hx. She is unable to participate in nutrition focused exam at this time. Weight hx reviewed and does not reveal significant change.  Pt diet advanced to full liquids now and pt is saying she's hungry. Will add oral nutrition supplement and continue to follow. Noted last BM 02/19/14  Height: Ht Readings from Last 1 Encounters:  02/23/14 5' 5"  (1.651 m)    Weight: Wt Readings from Last 1 Encounters:  02/23/14 159 lb 6.4 oz (72.303 kg)    Ideal Body Weight: 125# (56.8 kg)  Wt Readings from Last 10 Encounters:  02/23/14 159 lb 6.4 oz (72.303 kg)  02/23/14 159 lb 6.4 oz (72.303 kg)  03/02/13 165 lb (74.844 kg)  03/03/12 167 lb (75.751 kg)  03/02/12 157 lb (71.215 kg)  02/12/12 167 lb (75.751 kg)  02/12/12 167 lb (75.751 kg)  01/28/12 167 lb 6.4 oz (75.932 kg)  09/01/11 165 lb (74.844 kg)  04/10/11 153 lb (69.4 kg)    Usual Body Weight: 165#  BMI:  Body mass index is 26.53 kg/(m^2).overweight  Estimated Nutritional Needs: Kcal: 1425-1710 Protein: 68-79 gr Fluid: 1.4-1.7 liters daily  Skin: intact  Diet Order: Full Liquid  EDUCATION NEEDS: Education not appropriate at this time   Intake/Output Summary (Last 24 hours) at 02/24/14 1533 Last data filed at 02/24/14 1343  Gross per 24 hour  Intake 486.25 ml  Output    750 ml  Net -263.75 ml    Last BM: 02/19/14    Labs:   Recent Labs Lab 02/23/14 1104 02/23/14 1505 02/24/14 0540  NA 142 143 142  K 3.8 3.7 4.5  CL 104 104 101  CO2 25 30 28   BUN 4* 3* 6  CREATININE 0.54 0.54 0.68  CALCIUM 8.7 8.8 9.2  GLUCOSE 219* 110* 208*    CBG (last 3)   Recent Labs  02/23/14 1944 02/24/14 0801 02/24/14 1142  GLUCAP 247* 242* 213*    Scheduled Meds: . escitalopram  10 mg Oral Daily  . feeding supplement (GLUCERNA SHAKE)  237 mL Oral TID WC  . fluconazole  100 mg Oral Daily  . insulin aspart  0-24 Units Subcutaneous 6 times per day  . insulin glargine  30 Units Subcutaneous Daily  . levothyroxine  137 mcg Oral QAC breakfast  . lisinopril  20 mg Oral Daily  . metoCLOPramide (REGLAN) injection  5 mg Intravenous TID AC & HS  . nicotine  21 mg Transdermal Daily  . nystatin   Topical TID  . ondansetron (ZOFRAN) IV  4 mg Intravenous TID AC & HS  . pantoprazole  40 mg Oral BID AC  . sucralfate  1 g Oral TID WC & HS  . traMADol  50 mg Oral BID  . traZODone  100 mg Oral QHS    Continuous Infusions: . 0.9 % NaCl with KCl 40 mEq / L 75 mL/hr at 02/24/14 0631    Past  Medical History  Diagnosis Date  . Hypothyroid   . Anxiety   . COPD (chronic obstructive pulmonary disease)   . GERD (gastroesophageal reflux disease)   . PUD (peptic ulcer disease)     ? remote past, no reports from [facility] or [facility], states possibly Dr. Laural Golden.   . Hiatal hernia   . Chronic abdominal pain   . Diabetes mellitus     type I    Past Surgical History  Procedure Laterality Date  . Abdominal hysterectomy    . Tonsillectomy    . Appendectomy    . Bladder repair    . Esophagogastroduodenoscopy      in remote past, unclear who performed. No op notes from [facility] or [facility] (pt thought Dr. Laural Golden)  . Esophagogastroduodenoscopy  02/12/2012    Dr. Gala Romney:  Normal esophagus status post 30 F dilation. Small hiatal hernia. mild chronic gastritis  . Esophagogastroduodenoscopy N/A 02/21/2014    Procedure:  ESOPHAGOGASTRODUODENOSCOPY (EGD);  Surgeon: Daneil Dolin, MD;  Location: [facility];  Service: Endoscopy;  Laterality: N/A;  PHENERGAN 25 MG IV on call prior to procedure    Colman Cater MS,RD,CSG,LDN Office: [phone] Pager: [phone]

## 2014-02-25 LAB — GLUCOSE, CAPILLARY
GLUCOSE-CAPILLARY: 163 mg/dL — AB (ref 70–99)
GLUCOSE-CAPILLARY: 136 mg/dL — AB (ref 70–99)
Glucose-Capillary: 46 mg/dL — ABNORMAL LOW (ref 70–99)
Glucose-Capillary: 132 mg/dL — ABNORMAL HIGH (ref 70–99)
Glucose-Capillary: 79 mg/dL (ref 70–99)
Glucose-Capillary: 155 mg/dL — ABNORMAL HIGH (ref 70–99)

## 2014-02-25 MED ORDER — DEXTROSE 50 % IV SOLN
25.0000 mL | Freq: Once | INTRAVENOUS | Status: AC | PRN
  Administered 2014-02-25: 25 mL via INTRAVENOUS

## 2014-02-25 MED ORDER — CIPROFLOXACIN HCL 250 MG PO TABS
500.0000 mg | ORAL_TABLET | Freq: Two times a day (BID) | ORAL | Status: DC
  Administered 2014-02-25 – 2014-02-27 (×5): 500 mg via ORAL
  Filled 2014-02-25 (×5): qty 2

## 2014-02-25 MED ORDER — TRAZODONE HCL 50 MG PO TABS
50.0000 mg | ORAL_TABLET | Freq: Every day | ORAL | Status: DC
  Administered 2014-02-25 – 2014-02-26 (×2): 50 mg via ORAL
  Filled 2014-02-25 (×2): qty 1

## 2014-02-25 MED ORDER — GLUCERNA SHAKE PO LIQD
237.0000 mL | Freq: Three times a day (TID) | ORAL | Status: DC
  Administered 2014-02-26 – 2014-02-27 (×4): 237 mL via ORAL

## 2014-02-25 NOTE — Progress Notes (Signed)
Subjective: Since I last evaluated the patient PT IS BEING TREATED FOR UTI. ON DIFLUCAN AND TRAZODONE. CONTINUE WITH BURNING CHEST PAIN/ODYNOPHAGIA-SAME AS YESTERDAY. TOLERATING POs. NO VOMITING.  Objective: Vital signs in last 24 hours: Temp:  [97.8 F (36.6 C)-98.5 F (36.9 C)] 98.5 F (36.9 C) (06/06 0525) Pulse Rate:  [81-85] 83 (06/06 0525) Resp:  [16-18] 18 (06/06 0525) BP: (91-149)/(43-83) 149/83 mmHg (06/06 0853) SpO2:  [94 %-97 %] 94 % (06/06 0525) Last BM Date: 02/24/14  Intake/Output from previous day: 06/05 0701 - 06/06 0700 In: 300 [P.O.:300] Out: 1350 [Urine:1350] Intake/Output this shift:    General appearance: alert, cooperative and no distress Resp: clear to auscultation bilaterally Cardio: regular rate and rhythm GI: soft, MILD TTP IN THE EPIGASTRIUM & BUQS, NO REBOUND OR GUARDING; bowel sounds normal;   Lab Results: No results found for this basename: WBC, HGB, HCT, PLT,  in the last 72 hours BMET  Recent Labs  02/23/14 1104 02/23/14 1505 02/24/14 0540  NA 142 143 142  K 3.8 3.7 4.5  CL 104 104 101  CO2 25 30 28   GLUCOSE 219* 110* 208*  BUN 4* 3* 6  CREATININE 0.54 0.54 0.68  CALCIUM 8.7 8.8 9.2    Medications: I have reviewed the patient's current medications.  Assessment/Plan: ADMITTED WITH ODYNOPHAGIA AND VOMITING DUE TO ESOPHAGITIS. VOMITING IMPROVED. ODYNOPHAGIA/CHEST PAIN UNCHANGED AFTER 8 HRS OF DIFLUCAN.  PLAN: 1. DISCUSSED WITH PHARMACY. REDUCE TRAZODONE IN THE SETTING OF DIFLUCAN DUE TO RISK OF QT PROLONGATION. PT AWARE AND UNDERSTANDS. 2. PROTONIX BID 3. CONTINUE FULL LIQUIDS. ENCOURAGED PT TO DRINK GLUCERNA IMPROVE NUTRITION/SPEED UP RECOVERY.  4. VISCOUS LIDOCAINE PRN. 5. BIOPSIES PENDING.   LOS: 6 days   Sandi L Fields 02/25/2014, 9:27 AM

## 2014-02-25 NOTE — Progress Notes (Signed)
Subjective: Feels slightly better today. Her abdominal pain is better. Patient has urinary symptoms including dysuria and urgency. Her urine is showing leucocyte and to many to count WBC.  Objective: Vital signs in last 24 hours: Temp:  [97.8 F (36.6 C)-98.5 F (36.9 C)] 98.5 F (36.9 C) (06/06 0525) Pulse Rate:  [81-85] 83 (06/06 0525) Resp:  [16-18] 18 (06/06 0525) BP: (91-123)/(43-81) 91/43 mmHg (06/06 0525) SpO2:  [94 %-97 %] 94 % (06/06 0525) Weight change:  Last BM Date: 02/24/14  Intake/Output from previous day: 06/05 0701 - 06/06 0700 In: 300 [P.O.:300] Out: 1350 [Urine:1350]  PHYSICAL EXAM General appearance: alert and no distress Resp: clear to auscultation bilaterally Cardio: S1, S2 normal GI: soft, non-tender; bowel sounds normal; no masses,  no organomegaly Extremities: extremities normal, atraumatic, no cyanosis or edema  Lab Results:    @labtest @ [facility] No results found for this basename: PHART, PCO2, PO2ART, TCO2, HCO3,  in the last 72 hours CULTURES Recent Results (from the past 240 hour(s))  MRSA PCR SCREENING     Status: None   Collection Time    02/20/14  1:54 AM      Result Value Ref Range Status   MRSA by PCR NEGATIVE  NEGATIVE Final   Comment:            The GeneXpert MRSA Assay (FDA     approved for NASAL specimens     only), is one component of a     comprehensive MRSA colonization     surveillance program. It is not     intended to diagnose MRSA     infection nor to guide or     monitor treatment for     MRSA infections.  KOH PREP     Status: None   Collection Time    02/21/14  5:05 PM      Result Value Ref Range Status   Specimen Description ESOPHAGUS BRUSHING   Final   Special Requests NONE   Final   KOH Prep     Final   Value: ABUNDANT WBC PRESENT, PREDOMINANTLY PMN     RARE YEAST WITH PSEUDOHYPHAE     Performed at [facility]   Report Status 02/21/2014 FINAL   Final   Studies/Results: No results  found.  Medications: I have reviewed the patient's current medications.  Assesment: Principal Problem:   DKA (diabetic ketoacidoses) Active Problems:   CHEST PAIN UNSPECIFIED   Hematemesis   Nausea with vomiting esophagitis Hypokalemia-resolved UTi  Plan: Medications reviewed Will continue insulin therapy as per endocrine recommendation Continue IV hydration Continue PPI Will start on ciprofloxacin 500 mg po BID. Will monitor CBC/BMP     LOS: 6 days   Perle Gibbon 02/25/2014, 8:37 AM

## 2014-02-25 NOTE — Plan of Care (Signed)
Problem: Phase II Progression Outcomes Goal: Nausea & vomiting resolved Outcome: Progressing Patient is no longer vomiting.  Still c/o nausea.

## 2014-02-26 LAB — GLUCOSE, CAPILLARY
GLUCOSE-CAPILLARY: 135 mg/dL — AB (ref 70–99)
Glucose-Capillary: 75 mg/dL (ref 70–99)
Glucose-Capillary: 184 mg/dL — ABNORMAL HIGH (ref 70–99)
Glucose-Capillary: 136 mg/dL — ABNORMAL HIGH (ref 70–99)

## 2014-02-26 NOTE — Progress Notes (Signed)
Subjective: Since I last evaluated the patient IS TOLERATING POS. TOLERATING POs. LAST VOMITED x1 THIS AM. ABD PAIN/CHEST PAIN IMPROVED.  Objective: Vital signs in last 24 hours: Temp:  [97.3 F (36.3 C)-98.7 F (37.1 C)] 97.3 F (36.3 C) (06/07 0629) Pulse Rate:  [72-81] 77 (06/07 0629) Resp:  [20] 20 (06/07 0629) BP: (95-171)/(63-94) 118/74 mmHg (06/07 0934) SpO2:  [94 %-97 %] 95 % (06/07 0629) Last BM Date: 02/24/14  Intake/Output from previous day: 06/06 0701 - 06/07 0700 In: 240 [P.O.:240] Out: 1900 [Urine:1900] Intake/Output this shift: Total I/O In: 120 [P.O.:120] Out: -   General appearance: alert, cooperative and no distress Resp: clear to auscultation bilaterally Cardio: regular rate and rhythm GI: soft, MILD TTP IN EPIGASTRIUM; bowel sounds normal; no masses,  no organomegaly  Lab Results: No results found for this basename: WBC, HGB, HCT, PLT,  in the last 72 hours BMET  Recent Labs  02/23/14 1104 02/23/14 1505 02/24/14 0540  NA 142 143 142  K 3.8 3.7 4.5  CL 104 104 101  CO2 25 30 28   GLUCOSE 219* 110* 208*  BUN 4* 3* 6  CREATININE 0.54 0.54 0.68  CALCIUM 8.7 8.8 9.2    Studies/Results: No results found.  Medications: I have reviewed the patient's current medications.  Assessment/Plan: ADMITTED WITH HEMATEMESIS. CLINICALLY IMPROVED. TOLERATING POs.  PLAN: 1. ANTICIPATE D/C HOME JUN 9 2. SOFT MECH/LOW FAT/CAFFEINE FREE DIET 3. BID PPI 4. COMPLETE DIFLUCAN 5. CONTINUE REDUCED DOSE OR TRAZODONE ON D/C    LOS: 7 days   Kourtnee Lahey L Jaxson Anglin 02/26/2014, 10:59 AM

## 2014-02-26 NOTE — Progress Notes (Signed)
Subjective: Patient continued to complain of epigastric pain and nausea. No vomiting. She is started on cipro for UTI and her urinary symptoms feels better..  Objective: Vital signs in last 24 hours: Temp:  [97.3 F (36.3 C)-98.7 F (37.1 C)] 97.3 F (36.3 C) (06/07 0629) Pulse Rate:  [72-81] 77 (06/07 0629) Resp:  [20] 20 (06/07 0629) BP: (95-171)/(63-94) 95/63 mmHg (06/07 0629) SpO2:  [94 %-97 %] 95 % (06/07 0629) Weight change:  Last BM Date: 02/24/14  Intake/Output from previous day: 06/06 0701 - 06/07 0700 In: 240 [P.O.:240] Out: 1900 [Urine:1900]  PHYSICAL EXAM General appearance: alert and no distress Resp: clear to auscultation bilaterally Cardio: S1, S2 normal GI: soft, non-tender; bowel sounds normal; no masses,  no organomegaly Extremities: extremities normal, atraumatic, no cyanosis or edema  Lab Results:    @labtest @ [facility] No results found for this basename: PHART, PCO2, PO2ART, TCO2, HCO3,  in the last 72 hours CULTURES Recent Results (from the past 240 hour(s))  MRSA PCR SCREENING     Status: None   Collection Time    02/20/14  1:54 AM      Result Value Ref Range Status   MRSA by PCR NEGATIVE  NEGATIVE Final   Comment:            The GeneXpert MRSA Assay (FDA     approved for NASAL specimens     only), is one component of a     comprehensive MRSA colonization     surveillance program. It is not     intended to diagnose MRSA     infection nor to guide or     monitor treatment for     MRSA infections.  KOH PREP     Status: None   Collection Time    02/21/14  5:05 PM      Result Value Ref Range Status   Specimen Description ESOPHAGUS BRUSHING   Final   Special Requests NONE   Final   KOH Prep     Final   Value: ABUNDANT WBC PRESENT, PREDOMINANTLY PMN     RARE YEAST WITH PSEUDOHYPHAE     Performed at [facility]   Report Status 02/21/2014 FINAL   Final   Studies/Results: No results found.  Medications: I have reviewed the patient's  current medications.  Assesment: Principal Problem:   DKA (diabetic ketoacidoses) Active Problems:   CHEST PAIN UNSPECIFIED   Hematemesis   Nausea with vomiting esophagitis Hypokalemia-resolved UTi  Plan: Medications reviewed Will continue insulin therapy as per endocrine recommendation Continue IV hydration Continue PPI Continue ciprofloxacin 500 mg po BID    LOS: 7 days   Yacine Droz 02/26/2014, 8:22 AM

## 2014-02-27 ENCOUNTER — Telehealth: Payer: Self-pay | Admitting: Gastroenterology

## 2014-02-27 LAB — GLUCOSE, CAPILLARY: GLUCOSE-CAPILLARY: 68 mg/dL — AB (ref 70–99)

## 2014-02-27 MED ORDER — CIPROFLOXACIN HCL 500 MG PO TABS: 500.0000 mg | ORAL_TABLET | Freq: Two times a day (BID) | ORAL | Status: DC

## 2014-02-27 MED ORDER — SUCRALFATE 1 GM/10ML PO SUSP: 1.0000 g | Freq: Three times a day (TID) | ORAL | Status: DC

## 2014-02-27 MED ORDER — INSULIN GLARGINE 100 UNIT/ML ~~LOC~~ SOLN: 20.0000 [IU] | Freq: Every day | SUBCUTANEOUS | Status: DC

## 2014-02-27 MED ORDER — FLUCONAZOLE 100 MG PO TABS: 100.0000 mg | ORAL_TABLET | Freq: Every day | ORAL | Status: DC

## 2014-02-27 MED ORDER — LISINOPRIL 20 MG PO TABS: 20.0000 mg | ORAL_TABLET | Freq: Every day | ORAL | Status: DC

## 2014-02-27 MED ORDER — PANTOPRAZOLE SODIUM 40 MG PO TBEC: 40.0000 mg | DELAYED_RELEASE_TABLET | Freq: Two times a day (BID) | ORAL | Status: DC

## 2014-02-27 MED ORDER — TRAZODONE HCL 50 MG PO TABS: 50.0000 mg | ORAL_TABLET | Freq: Every day | ORAL | Status: DC

## 2014-02-27 NOTE — Discharge Summary (Signed)
Physician Discharge Summary  [name] MRN: [id number] DOB/AGE: [birth year] 53 y.o. Primary Care Physician:Kassondra Geil, MD Admit date: 02/19/2014 Discharge date: 02/27/2014    Discharge Diagnoses:   Principal Problem:   DKA (diabetic ketoacidoses) Active Problems:   CHEST PAIN UNSPECIFIED   Hematemesis   Nausea with vomiting severe erosive esophagitis candidial esophagitis UTI     Medication List    STOP taking these medications       esomeprazole 40 MG capsule  Commonly known as:  NEXIUM      TAKE these medications       albuterol 108 (90 BASE) MCG/ACT inhaler  Commonly known as:  PROVENTIL HFA;VENTOLIN HFA  Inhale 2 puffs into the lungs every 6 (six) hours as needed. For shortness of breath     ciprofloxacin 500 MG tablet  Commonly known as:  CIPRO  Take 1 tablet (500 mg total) by mouth 2 (two) times daily.     DULoxetine 20 MG capsule  Commonly known as:  CYMBALTA  Take 1 capsule (20 mg total) by mouth daily. For depression     escitalopram 10 MG tablet  Commonly known as:  LEXAPRO  Take 10 mg by mouth daily.     fluconazole 100 MG tablet  Commonly known as:  DIFLUCAN  Take 1 tablet (100 mg total) by mouth daily.     gabapentin 100 MG capsule  Commonly known as:  NEURONTIN  Take 2 capsules (200 mg total) by mouth 4 (four) times daily. For anxiety     insulin aspart 100 UNIT/ML injection  Commonly known as:  novoLOG  Inject 10-15 Units into the skin 3 (three) times daily before meals. For blood sugar control     insulin glargine 100 UNIT/ML injection  Commonly known as:  LANTUS  Inject 0.2 mLs (20 Units total) into the skin at bedtime.     levothyroxine 137 MCG tablet  Commonly known as:  SYNTHROID, LEVOTHROID  Take 1 tablet (137 mcg total) by mouth every morning. For thyroid hormone replacement     lisinopril 20 MG tablet  Commonly known as:  PRINIVIL,ZESTRIL  Take 1 tablet (20 mg total) by mouth daily.     pantoprazole 40  MG tablet  Commonly known as:  PROTONIX  Take 1 tablet (40 mg total) by mouth 2 (two) times daily before a meal.     polyethylene glycol powder powder  Commonly known as:  GLYCOLAX/MIRALAX  Take 17 g by mouth daily.     promethazine 25 MG tablet  Commonly known as:  PHENERGAN  Take 25 mg by mouth 3 (three) times daily as needed. For nausea and/or vimiting     sucralfate 1 GM/10ML suspension  Commonly known as:  CARAFATE  Take 10 mLs (1 g total) by mouth 4 (four) times daily -  with meals and at bedtime.     traMADol 50 MG tablet  Commonly known as:  ULTRAM  Take 50 mg by mouth 2 (two) times daily.     traZODone 50 MG tablet  Commonly known as:  DESYREL  Take 1 tablet (50 mg total) by mouth at bedtime.        Discharged Condition: improved    Consults: GI and endocrine  Significant Diagnostic Studies: Ct Abdomen Pelvis W Contrast  02/20/2014   CLINICAL DATA:  Abdominal tenderness nausea and vomiting  EXAM: CT ABDOMEN AND PELVIS WITH CONTRAST  TECHNIQUE: Multidetector CT imaging of the abdomen and pelvis was performed using the standard  protocol following bolus administration of intravenous contrast.  CONTRAST:  118mL OMNIPAQUE IOHEXOL 300 MG/ML  SOLN  COMPARISON:  04/16/2011  FINDINGS: Lung bases are free of acute infiltrate or sizable effusion.  The liver is well visualized and shows a vague hypodense area adjacent to the falciform ligament likely representing an area of focal fatty infiltration. The spleen, gallbladder, adrenal glands and pancreas are all normal in their CT appearance. The kidneys are well visualized bilaterally and reveal some mild scarring on the left. No renal calculi or obstructive changes are seen.  The appendix is not visualized consistent with a prior surgical history. The uterus has been surgically removed. The bladder is well distended. No pelvic mass lesion or sidewall abnormality is noted. The osseous structures show show degenerative change of the  thoracolumbar spine.  IMPRESSION: Hypodense lesion within the liver more prominent than that seen on the prior exam likely representing focal fatty infiltration. No other focal abnormality is noted.   Electronically Signed   By: Inez Catalina M.D.   On: 02/20/2014 12:47   Dg Abd Acute W/chest  02/20/2014   CLINICAL DATA:  HEMATEMESIS  EXAM: ACUTE ABDOMEN SERIES (ABDOMEN 2 VIEW & CHEST 1 VIEW)  COMPARISON:  Prior radiograph from 03/02/2013  FINDINGS: The cardiac and mediastinal silhouettes are stable in size and contour, and remain within normal limits.  The lungs are normally inflated. No airspace consolidation, pleural effusion, or pulmonary edema is identified. There is no pneumothorax.  A paucity of gas somewhat limits evaluation of the bowels. No evidence of obstruction or ileus. No soft tissue mass or abnormal calcification. No free intraperitoneal air.  No acute osseous abnormality.  IMPRESSION: 1. Nonobstructive bowel gas pattern with no radiographic evidence of acute intra-abdominal process. 2. No acute cardiopulmonary abnormality.   Electronically Signed   By: Jeannine Boga M.D.   On: 02/20/2014 00:46   US Abdomen Limited Ruq  02/22/2014   CLINICAL DATA:  RUQ pain  EXAM: US ABDOMEN LIMITED - RIGHT UPPER QUADRANT  COMPARISON:  None.  FINDINGS: Gallbladder:  No gallstones or wall thickening visualized, measuring 2.2 mm. No sonographic Murphy sign noted.  Common bile duct:  Diameter: 6.1 mm  Liver:  No focal lesion identified.  Heterogeneous echogenic architecture.  IMPRESSION: Common bile duct within the upper limits of normal.  Hepatic steatosis  Otherwise unremarkable   Electronically Signed   By: Margaree Mackintosh M.D.   On: 02/22/2014 16:07    Lab Results: Basic Metabolic Panel: No results found for this basename: NA, K, CL, CO2, GLUCOSE, BUN, CREATININE, CALCIUM, MG, PHOS,  in the last 72 hours Liver Function Tests: No results found for this basename: AST, ALT, ALKPHOS, BILITOT, PROT,  ALBUMIN,  in the last 72 hours   CBC: No results found for this basename: WBC, NEUTROABS, HGB, HCT, MCV, PLT,  in the last 72 hours  Recent Results (from the past 240 hour(s))  MRSA PCR SCREENING     Status: None   Collection Time    02/20/14  1:54 AM      Result Value Ref Range Status   MRSA by PCR NEGATIVE  NEGATIVE Final   Comment:            The GeneXpert MRSA Assay (FDA     approved for NASAL specimens     only), is one component of a     comprehensive MRSA colonization     surveillance program. It is not     intended to diagnose  MRSA     infection nor to guide or     monitor treatment for     MRSA infections.  KOH PREP     Status: None   Collection Time    02/21/14  5:05 PM      Result Value Ref Range Status   Specimen Description ESOPHAGUS BRUSHING   Final   Special Requests NONE   Final   KOH Prep     Final   Value: ABUNDANT WBC PRESENT, PREDOMINANTLY PMN     RARE YEAST WITH PSEUDOHYPHAE     Performed at [facility]   Report Status 02/21/2014 FINAL   Final     Hospital Course:  This is a 53 years old female with history of multiple medical illnesses was admitted due to DKA. Patient had also recurrent nausea and vomiting. GI consult was done. EGD was done which showed severe esophagitis and. She had candida on her biopsy. He DKA was controlled, however, patient had a prolonged hospital stay due nausea and vomiting. Patient gradually improved and discharged home to be followed in out patient.   Discharge Exam: Blood pressure 91/54, pulse 73, temperature 98.2 F (36.8 C), temperature source Oral, resp. rate 20, height 5\' 5"  (1.651 m), weight 72.303 kg (159 lb 6.4 oz), SpO2 98.00%.   Disposition:  Home         Follow-up Information   Follow up with [facility], [place] In 2 weeks.   Specialty:  Internal Medicine   Contact information:   [place] [address] [phone]       Signed: Rosita Fire   02/27/2014, 8:29  AM

## 2014-02-27 NOTE — Telephone Encounter (Signed)
Please have patient follow-up with me or Magda Paganini in 3-4 weeks, hospital follow-up.

## 2014-02-27 NOTE — Progress Notes (Signed)
Dr. Legrand Rams notified of patient low BP and that lisinopril was held. Also patient request more pain medication that ultram. Will order home health for patient per physician

## 2014-02-28 ENCOUNTER — Encounter: Payer: Self-pay | Admitting: Gastroenterology

## 2014-02-28 NOTE — Telephone Encounter (Signed)
Pt is aware of OV on 7/13 at 230 with [facility] and appt card mailed

## 2014-03-03 DIAGNOSIS — Z794 Long term (current) use of insulin: Secondary | ICD-10-CM | POA: Diagnosis not present

## 2014-03-03 DIAGNOSIS — N390 Urinary tract infection, site not specified: Secondary | ICD-10-CM | POA: Diagnosis not present

## 2014-03-03 DIAGNOSIS — K208 Other esophagitis without bleeding: Secondary | ICD-10-CM | POA: Diagnosis not present

## 2014-03-03 DIAGNOSIS — E119 Type 2 diabetes mellitus without complications: Secondary | ICD-10-CM | POA: Diagnosis not present

## 2014-03-03 DIAGNOSIS — K449 Diaphragmatic hernia without obstruction or gangrene: Secondary | ICD-10-CM | POA: Diagnosis not present

## 2014-03-03 DIAGNOSIS — E1010 Type 1 diabetes mellitus with ketoacidosis without coma: Secondary | ICD-10-CM | POA: Diagnosis not present

## 2014-03-03 DIAGNOSIS — F17200 Nicotine dependence, unspecified, uncomplicated: Secondary | ICD-10-CM | POA: Diagnosis not present

## 2014-03-03 DIAGNOSIS — B3781 Candidal esophagitis: Secondary | ICD-10-CM | POA: Diagnosis not present

## 2014-03-03 DIAGNOSIS — J449 Chronic obstructive pulmonary disease, unspecified: Secondary | ICD-10-CM | POA: Diagnosis not present

## 2014-03-03 DIAGNOSIS — R109 Unspecified abdominal pain: Secondary | ICD-10-CM | POA: Diagnosis not present

## 2014-03-07 DIAGNOSIS — R109 Unspecified abdominal pain: Secondary | ICD-10-CM | POA: Diagnosis not present

## 2014-03-07 DIAGNOSIS — B3781 Candidal esophagitis: Secondary | ICD-10-CM | POA: Diagnosis not present

## 2014-03-07 DIAGNOSIS — J449 Chronic obstructive pulmonary disease, unspecified: Secondary | ICD-10-CM | POA: Diagnosis not present

## 2014-03-07 DIAGNOSIS — E1010 Type 1 diabetes mellitus with ketoacidosis without coma: Secondary | ICD-10-CM | POA: Diagnosis not present

## 2014-03-07 DIAGNOSIS — K208 Other esophagitis without bleeding: Secondary | ICD-10-CM | POA: Diagnosis not present

## 2014-03-07 DIAGNOSIS — K449 Diaphragmatic hernia without obstruction or gangrene: Secondary | ICD-10-CM | POA: Diagnosis not present

## 2014-03-09 DIAGNOSIS — J449 Chronic obstructive pulmonary disease, unspecified: Secondary | ICD-10-CM | POA: Diagnosis not present

## 2014-03-09 DIAGNOSIS — B3781 Candidal esophagitis: Secondary | ICD-10-CM | POA: Diagnosis not present

## 2014-03-09 DIAGNOSIS — R109 Unspecified abdominal pain: Secondary | ICD-10-CM | POA: Diagnosis not present

## 2014-03-09 DIAGNOSIS — K208 Other esophagitis without bleeding: Secondary | ICD-10-CM | POA: Diagnosis not present

## 2014-03-09 DIAGNOSIS — E1010 Type 1 diabetes mellitus with ketoacidosis without coma: Secondary | ICD-10-CM | POA: Diagnosis not present

## 2014-03-09 DIAGNOSIS — K449 Diaphragmatic hernia without obstruction or gangrene: Secondary | ICD-10-CM | POA: Diagnosis not present

## 2014-03-14 DIAGNOSIS — B3781 Candidal esophagitis: Secondary | ICD-10-CM | POA: Diagnosis not present

## 2014-03-14 DIAGNOSIS — J449 Chronic obstructive pulmonary disease, unspecified: Secondary | ICD-10-CM | POA: Diagnosis not present

## 2014-03-14 DIAGNOSIS — K449 Diaphragmatic hernia without obstruction or gangrene: Secondary | ICD-10-CM | POA: Diagnosis not present

## 2014-03-14 DIAGNOSIS — K208 Other esophagitis without bleeding: Secondary | ICD-10-CM | POA: Diagnosis not present

## 2014-03-14 DIAGNOSIS — E1010 Type 1 diabetes mellitus with ketoacidosis without coma: Secondary | ICD-10-CM | POA: Diagnosis not present

## 2014-03-14 DIAGNOSIS — R109 Unspecified abdominal pain: Secondary | ICD-10-CM | POA: Diagnosis not present

## 2014-03-16 DIAGNOSIS — N390 Urinary tract infection, site not specified: Secondary | ICD-10-CM | POA: Diagnosis not present

## 2014-03-16 DIAGNOSIS — K449 Diaphragmatic hernia without obstruction or gangrene: Secondary | ICD-10-CM | POA: Diagnosis not present

## 2014-03-16 DIAGNOSIS — K208 Other esophagitis without bleeding: Secondary | ICD-10-CM | POA: Diagnosis not present

## 2014-03-16 DIAGNOSIS — R109 Unspecified abdominal pain: Secondary | ICD-10-CM | POA: Diagnosis not present

## 2014-03-16 DIAGNOSIS — B3781 Candidal esophagitis: Secondary | ICD-10-CM | POA: Diagnosis not present

## 2014-03-16 DIAGNOSIS — E1010 Type 1 diabetes mellitus with ketoacidosis without coma: Secondary | ICD-10-CM | POA: Diagnosis not present

## 2014-03-16 DIAGNOSIS — J449 Chronic obstructive pulmonary disease, unspecified: Secondary | ICD-10-CM | POA: Diagnosis not present

## 2014-03-21 DIAGNOSIS — K449 Diaphragmatic hernia without obstruction or gangrene: Secondary | ICD-10-CM | POA: Diagnosis not present

## 2014-03-21 DIAGNOSIS — B3781 Candidal esophagitis: Secondary | ICD-10-CM | POA: Diagnosis not present

## 2014-03-21 DIAGNOSIS — E7800 Pure hypercholesterolemia, unspecified: Secondary | ICD-10-CM | POA: Diagnosis not present

## 2014-03-21 DIAGNOSIS — R109 Unspecified abdominal pain: Secondary | ICD-10-CM | POA: Diagnosis not present

## 2014-03-21 DIAGNOSIS — K208 Other esophagitis without bleeding: Secondary | ICD-10-CM | POA: Diagnosis not present

## 2014-03-21 DIAGNOSIS — E1149 Type 2 diabetes mellitus with other diabetic neurological complication: Secondary | ICD-10-CM | POA: Diagnosis not present

## 2014-03-21 DIAGNOSIS — I10 Essential (primary) hypertension: Secondary | ICD-10-CM | POA: Diagnosis not present

## 2014-03-21 DIAGNOSIS — E119 Type 2 diabetes mellitus without complications: Secondary | ICD-10-CM | POA: Diagnosis not present

## 2014-03-21 DIAGNOSIS — J449 Chronic obstructive pulmonary disease, unspecified: Secondary | ICD-10-CM | POA: Diagnosis not present

## 2014-03-21 DIAGNOSIS — E1010 Type 1 diabetes mellitus with ketoacidosis without coma: Secondary | ICD-10-CM | POA: Diagnosis not present

## 2014-03-23 DIAGNOSIS — E1065 Type 1 diabetes mellitus with hyperglycemia: Secondary | ICD-10-CM | POA: Diagnosis not present

## 2014-03-23 DIAGNOSIS — Z91199 Patient's noncompliance with other medical treatment and regimen due to unspecified reason: Secondary | ICD-10-CM | POA: Diagnosis not present

## 2014-03-23 DIAGNOSIS — Z9119 Patient's noncompliance with other medical treatment and regimen: Secondary | ICD-10-CM | POA: Diagnosis not present

## 2014-03-23 DIAGNOSIS — E039 Hypothyroidism, unspecified: Secondary | ICD-10-CM | POA: Diagnosis not present

## 2014-03-23 DIAGNOSIS — IMO0002 Reserved for concepts with insufficient information to code with codable children: Secondary | ICD-10-CM | POA: Diagnosis not present

## 2014-03-23 DIAGNOSIS — I10 Essential (primary) hypertension: Secondary | ICD-10-CM | POA: Diagnosis not present

## 2014-03-30 DIAGNOSIS — E119 Type 2 diabetes mellitus without complications: Secondary | ICD-10-CM | POA: Diagnosis not present

## 2014-04-03 ENCOUNTER — Encounter: Payer: Self-pay | Admitting: Gastroenterology

## 2014-04-03 ENCOUNTER — Telehealth: Payer: Self-pay | Admitting: Gastroenterology

## 2014-04-03 ENCOUNTER — Ambulatory Visit: Payer: Self-pay | Admitting: Gastroenterology

## 2014-04-03 NOTE — Telephone Encounter (Signed)
Please send note

## 2014-04-03 NOTE — Telephone Encounter (Signed)
Pt was a no show

## 2014-04-06 ENCOUNTER — Encounter: Payer: Self-pay | Admitting: Gastroenterology

## 2014-04-06 NOTE — Telephone Encounter (Signed)
Mailed letter °

## 2014-04-07 ENCOUNTER — Encounter: Payer: Self-pay | Admitting: *Deleted

## 2014-04-12 DIAGNOSIS — B3781 Candidal esophagitis: Secondary | ICD-10-CM | POA: Diagnosis not present

## 2014-04-12 DIAGNOSIS — K208 Other esophagitis without bleeding: Secondary | ICD-10-CM | POA: Diagnosis not present

## 2014-04-12 DIAGNOSIS — R109 Unspecified abdominal pain: Secondary | ICD-10-CM | POA: Diagnosis not present

## 2014-04-12 DIAGNOSIS — J449 Chronic obstructive pulmonary disease, unspecified: Secondary | ICD-10-CM | POA: Diagnosis not present

## 2014-04-12 DIAGNOSIS — K449 Diaphragmatic hernia without obstruction or gangrene: Secondary | ICD-10-CM | POA: Diagnosis not present

## 2014-04-12 DIAGNOSIS — E1010 Type 1 diabetes mellitus with ketoacidosis without coma: Secondary | ICD-10-CM | POA: Diagnosis not present

## 2014-04-17 ENCOUNTER — Encounter (HOSPITAL_COMMUNITY): Payer: Self-pay | Admitting: Emergency Medicine

## 2014-04-17 ENCOUNTER — Encounter: Payer: Self-pay | Admitting: Obstetrics and Gynecology

## 2014-04-17 ENCOUNTER — Emergency Department (HOSPITAL_COMMUNITY)
Admission: EM | Admit: 2014-04-17 | Discharge: 2014-04-17 | Disposition: A | Discharge status: Home / Self Care | Payer: Medicare Other | Attending: Emergency Medicine | Admitting: Emergency Medicine

## 2014-04-17 DIAGNOSIS — Z8719 Personal history of other diseases of the digestive system: Secondary | ICD-10-CM | POA: Insufficient documentation

## 2014-04-17 DIAGNOSIS — G8929 Other chronic pain: Secondary | ICD-10-CM | POA: Diagnosis not present

## 2014-04-17 DIAGNOSIS — N898 Other specified noninflammatory disorders of vagina: Secondary | ICD-10-CM | POA: Insufficient documentation

## 2014-04-17 DIAGNOSIS — L03319 Cellulitis of trunk, unspecified: Principal | ICD-10-CM

## 2014-04-17 DIAGNOSIS — B3731 Acute candidiasis of vulva and vagina: Secondary | ICD-10-CM | POA: Insufficient documentation

## 2014-04-17 DIAGNOSIS — L02215 Cutaneous abscess of perineum: Secondary | ICD-10-CM

## 2014-04-17 DIAGNOSIS — L02219 Cutaneous abscess of trunk, unspecified: Secondary | ICD-10-CM | POA: Insufficient documentation

## 2014-04-17 DIAGNOSIS — E1142 Type 2 diabetes mellitus with diabetic polyneuropathy: Secondary | ICD-10-CM | POA: Diagnosis not present

## 2014-04-17 DIAGNOSIS — J449 Chronic obstructive pulmonary disease, unspecified: Secondary | ICD-10-CM | POA: Insufficient documentation

## 2014-04-17 DIAGNOSIS — J4489 Other specified chronic obstructive pulmonary disease: Secondary | ICD-10-CM | POA: Diagnosis not present

## 2014-04-17 DIAGNOSIS — Z794 Long term (current) use of insulin: Secondary | ICD-10-CM | POA: Insufficient documentation

## 2014-04-17 DIAGNOSIS — F17200 Nicotine dependence, unspecified, uncomplicated: Secondary | ICD-10-CM | POA: Diagnosis not present

## 2014-04-17 DIAGNOSIS — E039 Hypothyroidism, unspecified: Secondary | ICD-10-CM | POA: Insufficient documentation

## 2014-04-17 DIAGNOSIS — M130 Polyarthritis, unspecified: Secondary | ICD-10-CM | POA: Insufficient documentation

## 2014-04-17 DIAGNOSIS — E1049 Type 1 diabetes mellitus with other diabetic neurological complication: Secondary | ICD-10-CM | POA: Diagnosis not present

## 2014-04-17 DIAGNOSIS — B373 Candidiasis of vulva and vagina: Secondary | ICD-10-CM | POA: Diagnosis not present

## 2014-04-17 DIAGNOSIS — Z791 Long term (current) use of non-steroidal anti-inflammatories (NSAID): Secondary | ICD-10-CM | POA: Insufficient documentation

## 2014-04-17 DIAGNOSIS — Z8673 Personal history of transient ischemic attack (TIA), and cerebral infarction without residual deficits: Secondary | ICD-10-CM | POA: Diagnosis not present

## 2014-04-17 DIAGNOSIS — E280 Estrogen excess: Secondary | ICD-10-CM | POA: Diagnosis not present

## 2014-04-17 DIAGNOSIS — F411 Generalized anxiety disorder: Secondary | ICD-10-CM | POA: Insufficient documentation

## 2014-04-17 DIAGNOSIS — Z79899 Other long term (current) drug therapy: Secondary | ICD-10-CM | POA: Insufficient documentation

## 2014-04-17 LAB — URINE MICROSCOPIC-ADD ON

## 2014-04-17 LAB — URINALYSIS, ROUTINE W REFLEX MICROSCOPIC
Bilirubin Urine: NEGATIVE
Glucose, UA: NEGATIVE mg/dL
Hgb urine dipstick: NEGATIVE
Ketones, ur: NEGATIVE mg/dL
Nitrite: NEGATIVE
Protein, ur: NEGATIVE mg/dL
Specific Gravity, Urine: <1.005 — ABNORMAL LOW (ref 1.005–1.030)
Urobilinogen, UA: 0.2 mg/dL (ref 0.0–1.0)
pH: 5.5 (ref 5.0–8.0)

## 2014-04-17 MED ORDER — OXYCODONE-ACETAMINOPHEN 5-325 MG PO TABS
2.0000 | ORAL_TABLET | Freq: Once | ORAL | Status: AC
  Administered 2014-04-17: 2 via ORAL
  Filled 2014-04-17: qty 2

## 2014-04-17 MED ORDER — CLINDAMYCIN HCL 150 MG PO CAPS
600.0000 mg | ORAL_CAPSULE | Freq: Once | ORAL | Status: AC
  Administered 2014-04-17: 600 mg via ORAL
  Filled 2014-04-17: qty 4

## 2014-04-17 MED ORDER — ONDANSETRON 4 MG PO TBDP
4.0000 mg | ORAL_TABLET | Freq: Once | ORAL | Status: AC
  Administered 2014-04-17: 4 mg via ORAL
  Filled 2014-04-17: qty 1

## 2014-04-17 MED ORDER — SULFAMETHOXAZOLE-TMP DS 800-160 MG PO TABS: 1.0000 | ORAL_TABLET | Freq: Two times a day (BID) | ORAL | Status: DC

## 2014-04-17 MED ORDER — FLUCONAZOLE 200 MG PO TABS: 200.0000 mg | ORAL_TABLET | Freq: Every day | ORAL | Status: AC

## 2014-04-17 MED ORDER — OXYCODONE-ACETAMINOPHEN 5-325 MG PO TABS: 1.0000 | ORAL_TABLET | ORAL | Status: DC | PRN

## 2014-04-17 NOTE — ED Notes (Signed)
Pt abscess to the left posterior thigh and also c/o white creamy vaginal discharge per pt.

## 2014-04-17 NOTE — Discharge Instructions (Signed)

## 2014-04-21 ENCOUNTER — Encounter: Payer: Self-pay | Admitting: Obstetrics and Gynecology

## 2014-04-21 ENCOUNTER — Encounter: Payer: Self-pay | Admitting: *Deleted

## 2014-04-22 NOTE — ED Provider Notes (Signed)
CSN: [id number]     Arrival date & time 04/17/14  1847 History   First MD Initiated Contact with Patient 04/17/14 1905     Chief Complaint  Patient presents with  . Vaginal Discharge     (Consider location/radiation/quality/duration/timing/severity/associated sxs/prior Treatment) HPI  46yF with painful lesion to perineum. Onset a couple days ago and progressively worsening. No drainage. No fever or chills. Also vaginal itching. White discharge. Hx of yeast infection and recently treated for same. Attributes current symptoms to this. No fever or chills. No urinary complaints.   Past Medical History  Diagnosis Date  . Hypothyroid   . Anxiety   . COPD (chronic obstructive pulmonary disease)   . GERD (gastroesophageal reflux disease)   . PUD (peptic ulcer disease)     ? remote past, no reports from [place] or [facility], states possibly Dr. Laural Golden.   . Hiatal hernia   . Chronic abdominal pain   . Diabetes mellitus     type I  . Hyperlipidemia   . Stroke   . Polyneuropathy in diabetes   . Unspecified polyarthropathy or polyarthritis, site unspecified    Past Surgical History  Procedure Laterality Date  . Abdominal hysterectomy    . Tonsillectomy    . Appendectomy    . Bladder repair    . Esophagogastroduodenoscopy      in remote past, unclear who performed. No op notes from [facility] or [facility] (pt thought Dr. Laural Golden)  . Esophagogastroduodenoscopy  02/12/2012    Dr. Gala Romney:  Normal esophagus status post 86 F dilation. Small hiatal hernia. mild chronic gastritis  . Esophagogastroduodenoscopy N/A 02/21/2014    Procedure: ESOPHAGOGASTRODUODENOSCOPY (EGD);  Surgeon: Daneil Dolin, MD;  Location: [facility];  Service: Endoscopy;  Laterality: N/A;  PHENERGAN 25 MG IV on call prior to procedure   Family History  Problem Relation Age of Onset  . Liver cancer Mother     remission for 2 years  . Colon cancer Neg Hx    History  Substance Use Topics  . Smoking status: Current Every Day  Smoker -- 0.50 packs/day for 15 years    Types: Cigarettes  . Smokeless tobacco: Not on file  . Alcohol Use: No   OB History   Grav Para Term Preterm Abortions TAB SAB Ect Mult Living                 Review of Systems  All systems reviewed and negative, other than as noted in HPI.   Allergies  Phenytoin; Sulfonamide derivatives; and Tetracyclines & related  Home Medications   Prior to Admission medications   Medication Sig Start Date End Date Taking? Authorizing Provider  albuterol (PROVENTIL HFA;VENTOLIN HFA) 108 (90 BASE) MCG/ACT inhaler Inhale 2 puffs into the lungs every 6 (six) hours as needed. For shortness of breath 03/08/12  Yes Encarnacion Slates, NP  DULoxetine (CYMBALTA) 20 MG capsule Take 20 mg by mouth every evening.    Yes Historical Provider, MD  escitalopram (LEXAPRO) 10 MG tablet Take 10 mg by mouth at bedtime.    Yes Historical Provider, MD  gabapentin (NEURONTIN) 100 MG capsule Take 200 mg by mouth 4 (four) times daily.    Yes Historical Provider, MD  insulin aspart (NOVOLOG FLEXPEN) 100 UNIT/ML FlexPen Inject 5 Units into the skin 3 (three) times daily with meals. Takes 5 units with meals in addition to sliding scale for levels over 150   Yes Historical Provider, MD  Insulin Glargine (LANTUS SOLOSTAR) 100 UNIT/ML  Solostar Pen Inject 16 Units into the skin at bedtime.   Yes Historical Provider, MD  levothyroxine (SYNTHROID, LEVOTHROID) 137 MCG tablet Take 1 tablet (137 mcg total) by mouth every morning. For thyroid hormone replacement 03/08/12  Yes Encarnacion Slates, NP  lisinopril (PRINIVIL,ZESTRIL) 20 MG tablet Take 1 tablet (20 mg total) by mouth daily. 02/27/14  Yes Rosita Fire, MD  polyethylene glycol powder (GLYCOLAX/MIRALAX) powder Take 17 g by mouth daily. 02/03/14  Yes Historical Provider, MD  promethazine (PHENERGAN) 25 MG tablet Take 25 mg by mouth 3 (three) times daily as needed. For nausea and/or vimiting 02/03/14  Yes Historical Provider, MD  traMADol (ULTRAM) 50  MG tablet Take 50 mg by mouth 2 (two) times daily. 02/03/14  Yes Historical Provider, MD  traZODone (DESYREL) 50 MG tablet Take 1 tablet (50 mg total) by mouth at bedtime. 02/27/14  Yes Rosita Fire, MD  fluconazole (DIFLUCAN) 200 MG tablet Take 1 tablet (200 mg total) by mouth daily. 04/17/14 04/24/14  Virgel Manifold, MD  oxyCODONE-acetaminophen (PERCOCET/ROXICET) 5-325 MG per tablet Take 1-2 tablets by mouth every 4 (four) hours as needed for moderate pain or severe pain. 04/17/14   Virgel Manifold, MD  sulfamethoxazole-trimethoprim (BACTRIM DS) 800-160 MG per tablet Take 1 tablet by mouth 2 (two) times daily. 04/17/14   Virgel Manifold, MD   BP 119/92  Pulse 91  Temp(Src) 98.2 F (36.8 C)  Resp 18  Ht 5\' 5"  (1.651 m)  Wt 153 lb (69.4 kg)  BMI 25.46 kg/m2  SpO2 100% Physical Exam  Nursing note and vitals reviewed. Constitutional: She appears well-developed and well-nourished. No distress.  HENT:  Head: Normocephalic and atraumatic.  Eyes: Conjunctivae are normal. Right eye exhibits no discharge. Left eye exhibits no discharge.  Neck: Neck supple.  Cardiovascular: Normal rate, regular rhythm and normal heart sounds.  Exam reveals no gallop and no friction rub.   No murmur heard. Pulmonary/Chest: Effort normal and breath sounds normal. No respiratory distress.  Abdominal: Soft. She exhibits no distension. There is no tenderness.  Genitourinary:  Just below L labia majora there is a tender, indurated lesion. Erythematous. Bedside US w/o drain able collection.   Chaperone present for pelvic. Thick white vaginal discharge.   Musculoskeletal: She exhibits no edema and no tenderness.  Neurological: She is alert.  Skin: Skin is warm and dry.  Psychiatric: She has a normal mood and affect. Her behavior is normal. Thought content normal.    ED Course  Procedures (including critical care time) Labs Review Labs Reviewed  URINALYSIS, ROUTINE W REFLEX MICROSCOPIC - Abnormal; Notable for the  following:    Color, Urine STRAW (*)    Specific Gravity, Urine <1.005 (*)    Leukocytes, UA MODERATE (*)    All other components within normal limits  URINE MICROSCOPIC-ADD ON - Abnormal; Notable for the following:    Squamous Epithelial / LPF FEW (*)    Bacteria, UA FEW (*)    All other components within normal limits    Imaging Review No results found.   EKG Interpretation None      MDM   Final diagnoses:  Abscess, perineum  Yeast infection of the vagina    52yf with pain lesion to perineum. Likely developing abscess. Bedside US w/o drain able collection though. Will start bactrim. Likely yeast infection too. Itching. Clumpy white vaginal discharge. Unfortunately sample taken for GC, not wet prep. Will pt presumptively.     Virgel Manifold, MD 04/22/14 [phone]

## 2014-05-01 DIAGNOSIS — K208 Other esophagitis without bleeding: Secondary | ICD-10-CM | POA: Diagnosis not present

## 2014-05-01 DIAGNOSIS — B3781 Candidal esophagitis: Secondary | ICD-10-CM | POA: Diagnosis not present

## 2014-05-01 DIAGNOSIS — R109 Unspecified abdominal pain: Secondary | ICD-10-CM | POA: Diagnosis not present

## 2014-05-01 DIAGNOSIS — E1010 Type 1 diabetes mellitus with ketoacidosis without coma: Secondary | ICD-10-CM | POA: Diagnosis not present

## 2014-05-01 DIAGNOSIS — J449 Chronic obstructive pulmonary disease, unspecified: Secondary | ICD-10-CM | POA: Diagnosis not present

## 2014-05-01 DIAGNOSIS — K449 Diaphragmatic hernia without obstruction or gangrene: Secondary | ICD-10-CM | POA: Diagnosis not present

## 2014-05-16 ENCOUNTER — Ambulatory Visit: Payer: Self-pay | Admitting: Gastroenterology

## 2014-05-29 ENCOUNTER — Encounter (HOSPITAL_COMMUNITY): Payer: Self-pay | Admitting: Emergency Medicine

## 2014-05-29 ENCOUNTER — Emergency Department (HOSPITAL_COMMUNITY)
Admission: EM | Admit: 2014-05-29 | Discharge: 2014-05-29 | Disposition: A | Discharge status: Home / Self Care | Payer: Medicare Other | Attending: Emergency Medicine | Admitting: Emergency Medicine

## 2014-05-29 ENCOUNTER — Emergency Department (HOSPITAL_COMMUNITY): Payer: Medicare Other

## 2014-05-29 DIAGNOSIS — Z794 Long term (current) use of insulin: Secondary | ICD-10-CM | POA: Insufficient documentation

## 2014-05-29 DIAGNOSIS — G8929 Other chronic pain: Secondary | ICD-10-CM | POA: Diagnosis not present

## 2014-05-29 DIAGNOSIS — F411 Generalized anxiety disorder: Secondary | ICD-10-CM | POA: Diagnosis not present

## 2014-05-29 DIAGNOSIS — E1142 Type 2 diabetes mellitus with diabetic polyneuropathy: Secondary | ICD-10-CM | POA: Diagnosis not present

## 2014-05-29 DIAGNOSIS — S1093XA Contusion of unspecified part of neck, initial encounter: Principal | ICD-10-CM

## 2014-05-29 DIAGNOSIS — Z79899 Other long term (current) drug therapy: Secondary | ICD-10-CM | POA: Diagnosis not present

## 2014-05-29 DIAGNOSIS — S298XXA Other specified injuries of thorax, initial encounter: Secondary | ICD-10-CM | POA: Diagnosis not present

## 2014-05-29 DIAGNOSIS — K219 Gastro-esophageal reflux disease without esophagitis: Secondary | ICD-10-CM | POA: Diagnosis not present

## 2014-05-29 DIAGNOSIS — E1149 Type 2 diabetes mellitus with other diabetic neurological complication: Secondary | ICD-10-CM | POA: Insufficient documentation

## 2014-05-29 DIAGNOSIS — R739 Hyperglycemia, unspecified: Secondary | ICD-10-CM

## 2014-05-29 DIAGNOSIS — E109 Type 1 diabetes mellitus without complications: Secondary | ICD-10-CM | POA: Diagnosis not present

## 2014-05-29 DIAGNOSIS — S59909A Unspecified injury of unspecified elbow, initial encounter: Secondary | ICD-10-CM | POA: Diagnosis not present

## 2014-05-29 DIAGNOSIS — M25529 Pain in unspecified elbow: Secondary | ICD-10-CM | POA: Diagnosis not present

## 2014-05-29 DIAGNOSIS — J449 Chronic obstructive pulmonary disease, unspecified: Secondary | ICD-10-CM | POA: Diagnosis not present

## 2014-05-29 DIAGNOSIS — R079 Chest pain, unspecified: Secondary | ICD-10-CM | POA: Diagnosis not present

## 2014-05-29 DIAGNOSIS — J4489 Other specified chronic obstructive pulmonary disease: Secondary | ICD-10-CM | POA: Insufficient documentation

## 2014-05-29 DIAGNOSIS — S0083XA Contusion of other part of head, initial encounter: Secondary | ICD-10-CM | POA: Diagnosis not present

## 2014-05-29 DIAGNOSIS — S0003XA Contusion of scalp, initial encounter: Secondary | ICD-10-CM | POA: Diagnosis not present

## 2014-05-29 DIAGNOSIS — Z8673 Personal history of transient ischemic attack (TIA), and cerebral infarction without residual deficits: Secondary | ICD-10-CM | POA: Diagnosis not present

## 2014-05-29 DIAGNOSIS — T148XXA Other injury of unspecified body region, initial encounter: Secondary | ICD-10-CM

## 2014-05-29 DIAGNOSIS — F29 Unspecified psychosis not due to a substance or known physiological condition: Secondary | ICD-10-CM | POA: Diagnosis not present

## 2014-05-29 DIAGNOSIS — F17200 Nicotine dependence, unspecified, uncomplicated: Secondary | ICD-10-CM | POA: Insufficient documentation

## 2014-05-29 DIAGNOSIS — E039 Hypothyroidism, unspecified: Secondary | ICD-10-CM | POA: Diagnosis not present

## 2014-05-29 DIAGNOSIS — S6990XA Unspecified injury of unspecified wrist, hand and finger(s), initial encounter: Secondary | ICD-10-CM | POA: Diagnosis not present

## 2014-05-29 DIAGNOSIS — S0990XA Unspecified injury of head, initial encounter: Secondary | ICD-10-CM | POA: Insufficient documentation

## 2014-05-29 DIAGNOSIS — R110 Nausea: Secondary | ICD-10-CM | POA: Diagnosis not present

## 2014-05-29 LAB — CBG MONITORING, ED: Glucose-Capillary: 321 mg/dL — ABNORMAL HIGH (ref 70–99)

## 2014-05-29 MED ORDER — HYDROCODONE-ACETAMINOPHEN 5-325 MG PO TABS
1.0000 | ORAL_TABLET | ORAL | Status: AC
  Administered 2014-05-29: 1 via ORAL
  Filled 2014-05-29: qty 1

## 2014-05-29 MED ORDER — ONDANSETRON 4 MG PO TBDP
4.0000 mg | ORAL_TABLET | Freq: Once | ORAL | Status: AC
  Administered 2014-05-29: 4 mg via ORAL
  Filled 2014-05-29: qty 1

## 2014-05-29 MED ORDER — NAPROXEN 500 MG PO TABS: 500.0000 mg | ORAL_TABLET | Freq: Two times a day (BID) | ORAL | Status: DC

## 2014-05-29 MED ORDER — INSULIN ASPART 100 UNIT/ML ~~LOC~~ SOLN
4.0000 [IU] | Freq: Once | SUBCUTANEOUS | Status: AC
  Administered 2014-05-29: 4 [IU] via SUBCUTANEOUS
  Filled 2014-05-29: qty 1
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
## 2014-06-13 ENCOUNTER — Encounter: Payer: Self-pay | Admitting: Obstetrics & Gynecology

## 2014-06-13 ENCOUNTER — Other Ambulatory Visit (HOSPITAL_COMMUNITY)
Admission: RE | Admit: 2014-06-13 | Discharge: 2014-06-13 | Disposition: A | Discharge status: Home / Self Care | Payer: Medicare Other | Source: Ambulatory Visit | Attending: Obstetrics & Gynecology | Admitting: Obstetrics & Gynecology

## 2014-06-13 ENCOUNTER — Ambulatory Visit (INDEPENDENT_AMBULATORY_CARE_PROVIDER_SITE_OTHER): Payer: Medicare Other | Admitting: Obstetrics & Gynecology

## 2014-06-13 VITALS — BP 150/90 | Ht 65.0 in | Wt 156.0 lb

## 2014-06-13 DIAGNOSIS — Z01419 Encounter for gynecological examination (general) (routine) without abnormal findings: Secondary | ICD-10-CM

## 2014-06-13 DIAGNOSIS — Z124 Encounter for screening for malignant neoplasm of cervix: Secondary | ICD-10-CM | POA: Insufficient documentation

## 2014-06-13 DIAGNOSIS — A5901 Trichomonal vulvovaginitis: Secondary | ICD-10-CM | POA: Diagnosis not present

## 2014-06-13 DIAGNOSIS — Z1151 Encounter for screening for human papillomavirus (HPV): Secondary | ICD-10-CM | POA: Insufficient documentation

## 2014-06-13 MED ORDER — METRONIDAZOLE 500 MG PO TABS: 500.0000 mg | ORAL_TABLET | Freq: Two times a day (BID) | ORAL | Status: DC

## 2014-06-13 NOTE — Addendum Note (Signed)
Addended by: Doyne Keel on: 06/13/2014 03:05 PM   Modules accepted: Orders

## 2014-06-13 NOTE — Progress Notes (Signed)
[name], female   DOB: 1961/08/31, 53 y.o.   MRN: [id number] Subjective:     [name] is a 53 y.o. female here for a routine exam.  No LMP recorded. Patient has had a hysterectomy. No obstetric history on file. Birth Control Method:  hyst Menstrual Calendar(currently): hyst  Current complaints: vaginal discharge.   Current acute medical issues:  diabetes   Recent Gynecologic History No LMP recorded. Patient has had a hysterectomy. Last Pap: 2014,  normal Last mammogram: ?,    Past Medical History  Diagnosis Date  . Hypothyroid   . Anxiety   . COPD (chronic obstructive pulmonary disease)   . GERD (gastroesophageal reflux disease)   . PUD (peptic ulcer disease)     ? remote past, no reports from [facility] or [facility], states possibly Dr. Laural Golden.   . Hiatal hernia   . Chronic abdominal pain   . Diabetes mellitus     type I  . Hyperlipidemia   . Stroke   . Polyneuropathy in diabetes   . Unspecified polyarthropathy or polyarthritis, site unspecified     Past Surgical History  Procedure Laterality Date  . Abdominal hysterectomy    . Tonsillectomy    . Appendectomy    . Bladder repair    . Esophagogastroduodenoscopy      in remote past, unclear who performed. No op notes from [facility] or [facility] (pt thought Dr. Laural Golden)  . Esophagogastroduodenoscopy  02/12/2012    Dr. Gala Romney:  Normal esophagus status post 49 F dilation. Small hiatal hernia. mild chronic gastritis  . Esophagogastroduodenoscopy N/A 02/21/2014    LKJ:ZPHXTA exudative reflux esophagitis-rule out Candida esophagitis.    OB History   Grav Para Term Preterm Abortions TAB SAB Ect Mult Living                  History   Social History  . Marital Status: Divorced    Spouse Name: N/A    Number of Children: N/A  . Years of Education: N/A   Social History Main Topics  . Smoking status: Current Every Day Smoker -- 0.50 packs/day for 15 years    Types: Cigarettes  . Smokeless tobacco: None  .  Alcohol Use: No  . Drug Use: Yes    Special: Cocaine     Comment: last used several months ago  . Sexual Activity: None   Other Topics Concern  . None   Social History Narrative  . None    Family History  Problem Relation Age of Onset  . Liver cancer Mother     remission for 2 years  . Colon cancer Neg Hx      Review of Systems  Review of Systems  Constitutional: Negative for fever, chills, weight loss, malaise/fatigue and diaphoresis.  HENT: Negative for hearing loss, ear pain, nosebleeds, congestion, sore throat, neck pain, tinnitus and ear discharge.   Eyes: Negative for blurred vision, double vision, photophobia, pain, discharge and redness.  Respiratory: Negative for cough, hemoptysis, sputum production, shortness of breath, wheezing and stridor.   Cardiovascular: Negative for chest pain, palpitations, orthopnea, claudication, leg swelling and PND.  Gastrointestinal: negative for abdominal pain. Negative for heartburn, nausea, vomiting, diarrhea, constipation, blood in stool and melena.  Genitourinary: Negative for dysuria, urgency, frequency, hematuria and flank pain.  Musculoskeletal: Negative for myalgias, back pain, joint pain and falls.  Skin: Negative for itching and rash.  Neurological: Negative for dizziness, tingling, tremors, sensory change, speech change, focal weakness, seizures,  loss of consciousness, weakness and headaches.  Endo/Heme/Allergies: Negative for environmental allergies and polydipsia. Does not bruise/bleed easily.  Psychiatric/Behavioral: Negative for depression, suicidal ideas, hallucinations, memory loss and substance abuse. The patient is not nervous/anxious and does not have insomnia.        Objective:    Physical Exam  Vitals reviewed. Constitutional: She is oriented to person, place, and time. She appears well-developed and well-nourished.  HENT:  Head: Normocephalic and atraumatic.        Right Ear: External ear normal.  Left Ear:  External ear normal.  Nose: Nose normal.  Mouth/Throat: Oropharynx is clear and moist.  Eyes: Conjunctivae and EOM are normal. Pupils are equal, round, and reactive to light. Right eye exhibits no discharge. Left eye exhibits no discharge. No scleral icterus.  Neck: Normal range of motion. Neck supple. No tracheal deviation present. No thyromegaly present.  Cardiovascular: Normal rate, regular rhythm, normal heart sounds and intact distal pulses.  Exam reveals no gallop and no friction rub.   No murmur heard. Respiratory: Effort normal and breath sounds normal. No respiratory distress. She has no wheezes. She has no rales. She exhibits no tenderness.  GI: Soft. Bowel sounds are normal. She exhibits no distension and no mass. There is no tenderness. There is no rebound and no guarding.  Genitourinary:  Breasts no masses skin changes or nipple changes bilaterally      Vulva is normal without lesions Vagina is pink moist without discharge Cervix absent Uterus is normal size shape and contour Adnexa is negative  Rectal    hemoccult negative, normal tone, no masses  Musculoskeletal: Normal range of motion. She exhibits no edema and no tenderness.  Neurological: She is alert and oriented to person, place, and time. She has normal reflexes. She displays normal reflexes. No cranial nerve deficit. She exhibits normal muscle tone. Coordination normal.  Skin: Skin is warm and dry. No rash noted. No erythema. No pallor.  Psychiatric: She has a normal mood and affect. Her behavior is normal. Judgment and thought content normal.  Wet prep:  Positive for trichomonas     Assessment:   Trichomonas vaginalis.    Plan:    Follow up in: 3 weeks. metronidazole 500 BID x 7 days

## 2014-06-15 LAB — CYTOLOGY - PAP

## 2014-06-19 ENCOUNTER — Ambulatory Visit: Payer: Self-pay | Admitting: Gastroenterology

## 2014-06-19 ENCOUNTER — Encounter: Payer: Self-pay | Admitting: Gastroenterology

## 2014-07-04 ENCOUNTER — Ambulatory Visit: Payer: Medicare Other | Admitting: Obstetrics & Gynecology

## 2014-08-14 ENCOUNTER — Encounter (HOSPITAL_COMMUNITY): Payer: Self-pay

## 2014-08-14 ENCOUNTER — Emergency Department (HOSPITAL_COMMUNITY)
Admission: EM | Admit: 2014-08-14 | Discharge: 2014-08-14 | Disposition: A | Discharge status: Home / Self Care | Payer: Medicare Other | Attending: Emergency Medicine | Admitting: Emergency Medicine

## 2014-08-14 DIAGNOSIS — F419 Anxiety disorder, unspecified: Secondary | ICD-10-CM | POA: Insufficient documentation

## 2014-08-14 DIAGNOSIS — H538 Other visual disturbances: Secondary | ICD-10-CM | POA: Diagnosis not present

## 2014-08-14 DIAGNOSIS — R51 Headache: Secondary | ICD-10-CM | POA: Insufficient documentation

## 2014-08-14 DIAGNOSIS — Z720 Tobacco use: Secondary | ICD-10-CM | POA: Diagnosis not present

## 2014-08-14 DIAGNOSIS — K219 Gastro-esophageal reflux disease without esophagitis: Secondary | ICD-10-CM | POA: Insufficient documentation

## 2014-08-14 DIAGNOSIS — Z8711 Personal history of peptic ulcer disease: Secondary | ICD-10-CM | POA: Diagnosis not present

## 2014-08-14 DIAGNOSIS — Z79899 Other long term (current) drug therapy: Secondary | ICD-10-CM | POA: Insufficient documentation

## 2014-08-14 DIAGNOSIS — G8929 Other chronic pain: Secondary | ICD-10-CM | POA: Diagnosis not present

## 2014-08-14 DIAGNOSIS — R079 Chest pain, unspecified: Secondary | ICD-10-CM | POA: Insufficient documentation

## 2014-08-14 DIAGNOSIS — E1042 Type 1 diabetes mellitus with diabetic polyneuropathy: Secondary | ICD-10-CM | POA: Insufficient documentation

## 2014-08-14 DIAGNOSIS — Z791 Long term (current) use of non-steroidal anti-inflammatories (NSAID): Secondary | ICD-10-CM | POA: Diagnosis not present

## 2014-08-14 DIAGNOSIS — Z8673 Personal history of transient ischemic attack (TIA), and cerebral infarction without residual deficits: Secondary | ICD-10-CM | POA: Insufficient documentation

## 2014-08-14 DIAGNOSIS — R110 Nausea: Secondary | ICD-10-CM | POA: Diagnosis not present

## 2014-08-14 DIAGNOSIS — E039 Hypothyroidism, unspecified: Secondary | ICD-10-CM | POA: Diagnosis not present

## 2014-08-14 DIAGNOSIS — J449 Chronic obstructive pulmonary disease, unspecified: Secondary | ICD-10-CM | POA: Diagnosis not present

## 2014-08-14 DIAGNOSIS — L0211 Cutaneous abscess of neck: Secondary | ICD-10-CM | POA: Diagnosis not present

## 2014-08-14 DIAGNOSIS — Z794 Long term (current) use of insulin: Secondary | ICD-10-CM | POA: Insufficient documentation

## 2014-08-14 DIAGNOSIS — M159 Polyosteoarthritis, unspecified: Secondary | ICD-10-CM | POA: Diagnosis not present

## 2014-08-14 LAB — CBG MONITORING, ED: Glucose-Capillary: 242 mg/dL — ABNORMAL HIGH (ref 70–99)

## 2014-08-14 MED ORDER — DOXYCYCLINE HYCLATE 100 MG PO CAPS: 100.0000 mg | ORAL_CAPSULE | Freq: Two times a day (BID) | ORAL | Status: DC

## 2014-08-14 MED ORDER — HYDROCODONE-ACETAMINOPHEN 5-325 MG PO TABS: 1.0000 | ORAL_TABLET | Freq: Four times a day (QID) | ORAL | Status: DC | PRN

## 2014-08-14 MED ORDER — HYDROCODONE-ACETAMINOPHEN 5-325 MG PO TABS
1.0000 | ORAL_TABLET | Freq: Once | ORAL | Status: AC
Start: 2014-08-14 — End: 2014-08-14
  Administered 2014-08-14: 1 via ORAL
  Filled 2014-08-14: qty 1

## 2014-08-14 MED ORDER — DOXYCYCLINE HYCLATE 100 MG PO TABS
100.0000 mg | ORAL_TABLET | Freq: Once | ORAL | Status: AC
  Administered 2014-08-14: 100 mg via ORAL
  Filled 2014-08-14: qty 1

## 2014-08-14 MED ORDER — ONDANSETRON 4 MG PO TBDP: 4.0000 mg | ORAL_TABLET | Freq: Three times a day (TID) | ORAL | Status: DC | PRN

## 2014-08-14 MED ORDER — ONDANSETRON 4 MG PO TBDP
4.0000 mg | ORAL_TABLET | Freq: Once | ORAL | Status: AC
  Administered 2014-08-14: 4 mg via ORAL
  Filled 2014-08-14: qty 1

## 2014-08-14 NOTE — ED Provider Notes (Signed)
CSN: [id number]     Arrival date & time 08/14/14  1350 History  This chart was scribed for Fredia Sorrow, MD by Edison Simon, ED Scribe. This patient was seen in room [facility] and the patient's care was started at 3:44 PM.    Chief Complaint  Patient presents with  . Abscess   Patient is a 53 y.o. female presenting with abscess. The history is provided by the patient. No language interpreter was used.  Abscess Location:  Head/neck Head/neck abscess location:  R neck Size:  2x3cm Abscess quality: draining, painful and redness   Abscess quality: no fluctuance and no induration   Red streaking: no   Duration:  2 weeks Progression:  Worsening Pain details:    Severity:  Moderate   Timing:  Constant   Progression:  Worsening Chronicity:  New (new to this location, but has had similar abscesses elsewhere) Context: diabetes   Relieved by:  None tried Worsened by:  Nothing tried Ineffective treatments:  None tried Associated symptoms: fever, headaches and nausea   Associated symptoms: no vomiting     HPI Comments: [name] is a 53 y.o. female with history of diabetes who presents to the Emergency Department complaining of painful abscess to the base of her neck on the right side with onset 2 weeks ago. She rates pain at 10/10. She states it drained a little this morning. She reports prior similar cysts to her rectum, axilla, and genital area. She reports associated fever last night.  Past Medical History  Diagnosis Date  . Hypothyroid   . Anxiety   . COPD (chronic obstructive pulmonary disease)   . GERD (gastroesophageal reflux disease)   . PUD (peptic ulcer disease)     ? remote past, no reports from [facility] or [facility], states possibly Dr. Laural Golden.   . Hiatal hernia   . Chronic abdominal pain   . Diabetes mellitus     type I  . Hyperlipidemia   . Stroke   . Polyneuropathy in diabetes   . Unspecified polyarthropathy or polyarthritis, site unspecified    Past Surgical  History  Procedure Laterality Date  . Abdominal hysterectomy    . Tonsillectomy    . Appendectomy    . Bladder repair    . Esophagogastroduodenoscopy      in remote past, unclear who performed. No op notes from [facility] or [facility] (pt thought Dr. Laural Golden)  . Esophagogastroduodenoscopy  02/12/2012    Dr. Gala Romney:  Normal esophagus status post 27 F dilation. Small hiatal hernia. mild chronic gastritis  . Esophagogastroduodenoscopy N/A 02/21/2014    [facility]:YSAYTK exudative reflux esophagitis-rule out Candida esophagitis.   Family History  Problem Relation Age of Onset  . Liver cancer Mother     remission for 2 years  . Colon cancer Neg Hx    History  Substance Use Topics  . Smoking status: Current Every Day Smoker -- 0.50 packs/day for 15 years    Types: Cigarettes  . Smokeless tobacco: Not on file  . Alcohol Use: No   OB History    No data available     Review of Systems  Constitutional: Positive for fever. Negative for chills.  HENT: Negative for rhinorrhea and sore throat.   Eyes: Positive for visual disturbance (blurry).  Respiratory: Negative for cough and shortness of breath.   Cardiovascular: Positive for chest pain (right-sided). Negative for leg swelling.  Gastrointestinal: Positive for nausea. Negative for vomiting, abdominal pain and diarrhea.  Genitourinary: Negative for dysuria.  Musculoskeletal: Negative for back pain.  Skin: Negative for rash.       Erythematous abscess to base of neck on right side  Neurological: Positive for headaches.  Hematological: Does not bruise/bleed easily.  Psychiatric/Behavioral: Negative for confusion.      Allergies  Phenytoin; Sulfonamide derivatives; and Tetracyclines & related  Home Medications   Prior to Admission medications   Medication Sig Start Date End Date Taking? Authorizing Provider  albuterol (PROVENTIL HFA;VENTOLIN HFA) 108 (90 BASE) MCG/ACT inhaler Inhale 2 puffs into the lungs every 6 (six) hours as needed. For  shortness of breath 03/08/12   Encarnacion Slates, NP  doxycycline (VIBRAMYCIN) 100 MG capsule Take 1 capsule (100 mg total) by mouth 2 (two) times daily. 08/14/14   Fredia Sorrow, MD  DULoxetine (CYMBALTA) 20 MG capsule Take 20 mg by mouth every evening.     Historical Provider, MD  escitalopram (LEXAPRO) 10 MG tablet Take 10 mg by mouth at bedtime.     Historical Provider, MD  gabapentin (NEURONTIN) 100 MG capsule Take 200 mg by mouth 4 (four) times daily.     Historical Provider, MD  HYDROcodone-acetaminophen (NORCO/VICODIN) 5-325 MG per tablet Take 1-2 tablets by mouth every 6 (six) hours as needed. 08/14/14   Fredia Sorrow, MD  insulin aspart (NOVOLOG FLEXPEN) 100 UNIT/ML FlexPen Inject 5 Units into the skin 3 (three) times daily with meals. Takes 5 units with meals in addition to sliding scale for levels over 150    Historical Provider, MD  Insulin Glargine (LANTUS SOLOSTAR) 100 UNIT/ML Solostar Pen Inject 16 Units into the skin at bedtime.    Historical Provider, MD  levothyroxine (SYNTHROID, LEVOTHROID) 137 MCG tablet Take 1 tablet (137 mcg total) by mouth every morning. For thyroid hormone replacement 03/08/12   Encarnacion Slates, NP  lisinopril (PRINIVIL,ZESTRIL) 20 MG tablet Take 1 tablet (20 mg total) by mouth daily. 02/27/14   Rosita Fire, MD  metroNIDAZOLE (FLAGYL) 500 MG tablet Take 1 tablet (500 mg total) by mouth 2 (two) times daily. 06/13/14   Florian Buff, MD  naproxen (NAPROSYN) 500 MG tablet Take 1 tablet (500 mg total) by mouth 2 (two) times daily. 05/29/14   Dorie Rank, MD  NEXIUM 40 MG capsule Take 49 mg by mouth every morning.  05/13/14   Historical Provider, MD  ondansetron (ZOFRAN ODT) 4 MG disintegrating tablet Take 1 tablet (4 mg total) by mouth every 8 (eight) hours as needed. 08/14/14   Fredia Sorrow, MD  polyethylene glycol powder (GLYCOLAX/MIRALAX) powder Take 17 g by mouth daily. 02/03/14   Historical Provider, MD  promethazine (PHENERGAN) 25 MG tablet Take 25 mg by mouth 3  (three) times daily as needed. For nausea and/or vimiting 02/03/14   Historical Provider, MD  traMADol (ULTRAM) 50 MG tablet Take 50 mg by mouth 2 (two) times daily. 02/03/14   Historical Provider, MD  traZODone (DESYREL) 50 MG tablet Take 1 tablet (50 mg total) by mouth at bedtime. 02/27/14   Rosita Fire, MD   BP 159/75 mmHg  Pulse 91  Temp(Src) 97.7 F (36.5 C) (Oral)  Resp 18  Ht 5\' 5"  (1.651 m)  Wt 162 lb (73.483 kg)  BMI 26.96 kg/m2  SpO2 100% Physical Exam  Constitutional: She is oriented to person, place, and time. She appears well-developed and well-nourished.  HENT:  Head: Normocephalic and atraumatic.  Eyes: Conjunctivae and EOM are normal.  Neck: Normal range of motion. Neck supple.  Abscess to base of neck on right  side measuring 3x2cm in size with some redness, white head present No fluctuance or induration  Cardiovascular: Normal rate and regular rhythm.   Pulmonary/Chest: Effort normal and breath sounds normal. No respiratory distress. She has no wheezes. She has no rales.  Lungs clear on both sides  Abdominal: Soft. Bowel sounds are normal. She exhibits no distension. There is no tenderness. There is no rebound and no guarding.  Musculoskeletal: Normal range of motion. She exhibits no edema.  Lymphadenopathy:    She has no cervical adenopathy.  Neurological: She is alert and oriented to person, place, and time. No cranial nerve deficit. She exhibits normal muscle tone. Coordination normal.  Skin: Skin is warm and dry.  Psychiatric: She has a normal mood and affect.  Nursing note and vitals reviewed.   ED Course  Procedures (including critical care time)  DIAGNOSTIC STUDIES: Oxygen Saturation is 100% on room air, normal by my interpretation.    COORDINATION OF CARE: 4:54 PM Discussed treatment plan with patient at beside, the patient agrees with the plan and has no further questions at this time.   Labs Review Labs Reviewed  CBG MONITORING, ED - Abnormal;  Notable for the following:    Glucose-Capillary 242 (*)    All other components within normal limits    Imaging Review No results found.   EKG Interpretation None      MDM   Final diagnoses:  Abscess of neck    Patient with the abscess to the base of the neck on the right side. No fluctuance. Not needing IND at this time. Mostly induration. Will treat with antibiotics. Symptoms aren't present for 2 weeks. Patient will return if it does not improve on antibiotics. Wound care instructions provided. No trouble swallowing. No adenopathy. Patient is nontoxic no acute distress.  I personally performed the services described in this documentation, which was scribed in my presence. The recorded information has been reviewed and is accurate.     Fredia Sorrow, MD 08/14/14 [phone]

## 2014-08-14 NOTE — ED Notes (Signed)
MD at the bedside  

## 2014-08-14 NOTE — ED Notes (Signed)
Abscess to rt side of neck x2 weeks per pt.

## 2014-08-14 NOTE — Discharge Instructions (Signed)
Take antibiotic as directed. Also take antinausea medicine since then by tends to making nausea. Pain medicine as needed. Warm compresses to the abscess area.  Return if not improving over the next few days. Return for any new worse symptoms.

## 2014-08-14 NOTE — ED Notes (Signed)
Patient given discharge instruction, verbalized understand. Patient ambulatory out of the department.  

## 2014-09-03 ENCOUNTER — Emergency Department (HOSPITAL_COMMUNITY)
Admission: EM | Admit: 2014-09-03 | Discharge: 2014-09-04 | Disposition: A | Discharge status: Home / Self Care | Payer: Medicare Other | Attending: Emergency Medicine | Admitting: Emergency Medicine

## 2014-09-03 ENCOUNTER — Emergency Department (HOSPITAL_COMMUNITY): Payer: Medicare Other

## 2014-09-03 ENCOUNTER — Encounter (HOSPITAL_COMMUNITY): Payer: Self-pay | Admitting: Emergency Medicine

## 2014-09-03 DIAGNOSIS — S8261XA Displaced fracture of lateral malleolus of right fibula, initial encounter for closed fracture: Secondary | ICD-10-CM | POA: Diagnosis not present

## 2014-09-03 DIAGNOSIS — S8264XA Nondisplaced fracture of lateral malleolus of right fibula, initial encounter for closed fracture: Secondary | ICD-10-CM | POA: Diagnosis not present

## 2014-09-03 DIAGNOSIS — E1042 Type 1 diabetes mellitus with diabetic polyneuropathy: Secondary | ICD-10-CM | POA: Diagnosis not present

## 2014-09-03 DIAGNOSIS — S8992XA Unspecified injury of left lower leg, initial encounter: Secondary | ICD-10-CM | POA: Diagnosis not present

## 2014-09-03 DIAGNOSIS — Z8711 Personal history of peptic ulcer disease: Secondary | ICD-10-CM | POA: Insufficient documentation

## 2014-09-03 DIAGNOSIS — S0990XA Unspecified injury of head, initial encounter: Secondary | ICD-10-CM | POA: Diagnosis not present

## 2014-09-03 DIAGNOSIS — Z794 Long term (current) use of insulin: Secondary | ICD-10-CM | POA: Insufficient documentation

## 2014-09-03 DIAGNOSIS — S99911A Unspecified injury of right ankle, initial encounter: Secondary | ICD-10-CM | POA: Diagnosis present

## 2014-09-03 DIAGNOSIS — W19XXXA Unspecified fall, initial encounter: Secondary | ICD-10-CM

## 2014-09-03 DIAGNOSIS — S3992XA Unspecified injury of lower back, initial encounter: Secondary | ICD-10-CM | POA: Insufficient documentation

## 2014-09-03 DIAGNOSIS — Z792 Long term (current) use of antibiotics: Secondary | ICD-10-CM | POA: Diagnosis not present

## 2014-09-03 DIAGNOSIS — W01198A Fall on same level from slipping, tripping and stumbling with subsequent striking against other object, initial encounter: Secondary | ICD-10-CM | POA: Diagnosis not present

## 2014-09-03 DIAGNOSIS — Z720 Tobacco use: Secondary | ICD-10-CM | POA: Insufficient documentation

## 2014-09-03 DIAGNOSIS — F419 Anxiety disorder, unspecified: Secondary | ICD-10-CM | POA: Insufficient documentation

## 2014-09-03 DIAGNOSIS — Y92009 Unspecified place in unspecified non-institutional (private) residence as the place of occurrence of the external cause: Secondary | ICD-10-CM

## 2014-09-03 DIAGNOSIS — Z79899 Other long term (current) drug therapy: Secondary | ICD-10-CM | POA: Diagnosis not present

## 2014-09-03 DIAGNOSIS — Z791 Long term (current) use of non-steroidal anti-inflammatories (NSAID): Secondary | ICD-10-CM | POA: Insufficient documentation

## 2014-09-03 DIAGNOSIS — Y9389 Activity, other specified: Secondary | ICD-10-CM | POA: Diagnosis not present

## 2014-09-03 DIAGNOSIS — M159 Polyosteoarthritis, unspecified: Secondary | ICD-10-CM | POA: Diagnosis not present

## 2014-09-03 DIAGNOSIS — R609 Edema, unspecified: Secondary | ICD-10-CM | POA: Diagnosis not present

## 2014-09-03 DIAGNOSIS — M542 Cervicalgia: Secondary | ICD-10-CM | POA: Diagnosis not present

## 2014-09-03 DIAGNOSIS — M549 Dorsalgia, unspecified: Secondary | ICD-10-CM | POA: Diagnosis not present

## 2014-09-03 DIAGNOSIS — Y998 Other external cause status: Secondary | ICD-10-CM | POA: Diagnosis not present

## 2014-09-03 DIAGNOSIS — J449 Chronic obstructive pulmonary disease, unspecified: Secondary | ICD-10-CM | POA: Diagnosis not present

## 2014-09-03 DIAGNOSIS — Y92018 Other place in single-family (private) house as the place of occurrence of the external cause: Secondary | ICD-10-CM | POA: Insufficient documentation

## 2014-09-03 DIAGNOSIS — S82831A Other fracture of upper and lower end of right fibula, initial encounter for closed fracture: Secondary | ICD-10-CM | POA: Diagnosis not present

## 2014-09-03 DIAGNOSIS — Z8673 Personal history of transient ischemic attack (TIA), and cerebral infarction without residual deficits: Secondary | ICD-10-CM | POA: Diagnosis not present

## 2014-09-03 DIAGNOSIS — E039 Hypothyroidism, unspecified: Secondary | ICD-10-CM | POA: Insufficient documentation

## 2014-09-03 DIAGNOSIS — S299XXA Unspecified injury of thorax, initial encounter: Secondary | ICD-10-CM | POA: Insufficient documentation

## 2014-09-03 DIAGNOSIS — K219 Gastro-esophageal reflux disease without esophagitis: Secondary | ICD-10-CM | POA: Diagnosis not present

## 2014-09-03 DIAGNOSIS — S199XXA Unspecified injury of neck, initial encounter: Secondary | ICD-10-CM | POA: Diagnosis not present

## 2014-09-03 DIAGNOSIS — M25562 Pain in left knee: Secondary | ICD-10-CM | POA: Diagnosis not present

## 2014-09-03 DIAGNOSIS — G8929 Other chronic pain: Secondary | ICD-10-CM | POA: Insufficient documentation

## 2014-09-03 DIAGNOSIS — M25571 Pain in right ankle and joints of right foot: Secondary | ICD-10-CM | POA: Diagnosis not present

## 2014-09-03 MED ORDER — ONDANSETRON HCL 4 MG/2ML IJ SOLN
4.0000 mg | Freq: Once | INTRAMUSCULAR | Status: AC
  Administered 2014-09-03: 4 mg via INTRAVENOUS
  Filled 2014-09-03: qty 2

## 2014-09-03 MED ORDER — MORPHINE SULFATE 4 MG/ML IJ SOLN
4.0000 mg | Freq: Once | INTRAMUSCULAR | Status: AC
  Administered 2014-09-03: 4 mg via INTRAVENOUS
  Filled 2014-09-03: qty 1

## 2014-09-03 NOTE — ED Notes (Signed)
Pt used female urinal with assistance

## 2014-09-03 NOTE — ED Notes (Signed)
Pt fell off porch about a 12 inch drop. Turned right ankle, no loc or dizzness at scene. Given 66mcg of fentanyl in field via ems. Swelling to ankle, no obvious deformity. Positive pedal pulses.

## 2014-09-03 NOTE — ED Provider Notes (Signed)
CSN: [id number]     Arrival date & time 09/03/14  2304 History  This chart was scribed for Mandy Fuel, MD by Lowella Petties, ED Scribe. The patient was seen in room APA17/APA17. Patient's care was started at 11:29 PM.   Chief Complaint  Patient presents with  . Fall   The history is provided by the patient. No language interpreter was used.   HPI Comments: [name] is a 53 y.o. female who was brought by EMs to the Emergency Department after falling backwards off of her 12 inch porch and turning her right ankle earlier this evening. She denies head injury or LOC. She reports constant, throbbing, pain in her back and right ankle that she rates as a 10/10. She reports moderate pain in her neck. She denies additional injury.   Past Medical History  Diagnosis Date  . Hypothyroid   . Anxiety   . COPD (chronic obstructive pulmonary disease)   . GERD (gastroesophageal reflux disease)   . PUD (peptic ulcer disease)     ? remote past, no reports from [facility] or [facility], states possibly Dr. Laural Golden.   . Hiatal hernia   . Chronic abdominal pain   . Diabetes mellitus     type I  . Hyperlipidemia   . Stroke   . Polyneuropathy in diabetes   . Unspecified polyarthropathy or polyarthritis, site unspecified    Past Surgical History  Procedure Laterality Date  . Abdominal hysterectomy    . Tonsillectomy    . Appendectomy    . Bladder repair    . Esophagogastroduodenoscopy      in remote past, unclear who performed. No op notes from [facility] or [facility] (pt thought Dr. Laural Golden)  . Esophagogastroduodenoscopy  02/12/2012    Dr. Gala Romney:  Normal esophagus status post 30 F dilation. Small hiatal hernia. mild chronic gastritis  . Esophagogastroduodenoscopy N/A 02/21/2014    [facility]:QIONGE exudative reflux esophagitis-rule out Candida esophagitis.   Family History  Problem Relation Age of Onset  . Liver cancer Mother     remission for 2 years  . Colon cancer Neg Hx    History  Substance Use Topics  .  Smoking status: Current Every Day Smoker -- 0.50 packs/day for 15 years    Types: Cigarettes  . Smokeless tobacco: Not on file  . Alcohol Use: No   OB History    No data available     Review of Systems  Musculoskeletal: Positive for back pain and arthralgias (right ankle).  All other systems reviewed and are negative.  Allergies  Phenytoin; Sulfonamide derivatives; and Tetracyclines & related  Home Medications   Prior to Admission medications   Medication Sig Start Date End Date Taking? Authorizing Provider  albuterol (PROVENTIL HFA;VENTOLIN HFA) 108 (90 BASE) MCG/ACT inhaler Inhale 2 puffs into the lungs every 6 (six) hours as needed. For shortness of breath 03/08/12   Encarnacion Slates, NP  doxycycline (VIBRAMYCIN) 100 MG capsule Take 1 capsule (100 mg total) by mouth 2 (two) times daily. 08/14/14   Fredia Sorrow, MD  DULoxetine (CYMBALTA) 20 MG capsule Take 20 mg by mouth every evening.     Historical Provider, MD  escitalopram (LEXAPRO) 10 MG tablet Take 10 mg by mouth at bedtime.     Historical Provider, MD  gabapentin (NEURONTIN) 100 MG capsule Take 200 mg by mouth 4 (four) times daily.     Historical Provider, MD  HYDROcodone-acetaminophen (NORCO/VICODIN) 5-325 MG per tablet Take 1-2 tablets by mouth every  6 (six) hours as needed. 08/14/14   Fredia Sorrow, MD  insulin aspart (NOVOLOG FLEXPEN) 100 UNIT/ML FlexPen Inject 5 Units into the skin 3 (three) times daily with meals. Takes 5 units with meals in addition to sliding scale for levels over 150    Historical Provider, MD  Insulin Glargine (LANTUS SOLOSTAR) 100 UNIT/ML Solostar Pen Inject 16 Units into the skin at bedtime.    Historical Provider, MD  levothyroxine (SYNTHROID, LEVOTHROID) 137 MCG tablet Take 1 tablet (137 mcg total) by mouth every morning. For thyroid hormone replacement 03/08/12   Encarnacion Slates, NP  lisinopril (PRINIVIL,ZESTRIL) 20 MG tablet Take 1 tablet (20 mg total) by mouth daily. 02/27/14   Rosita Fire, MD   metroNIDAZOLE (FLAGYL) 500 MG tablet Take 1 tablet (500 mg total) by mouth 2 (two) times daily. 06/13/14   Florian Buff, MD  naproxen (NAPROSYN) 500 MG tablet Take 1 tablet (500 mg total) by mouth 2 (two) times daily. 05/29/14   Dorie Rank, MD  NEXIUM 40 MG capsule Take 49 mg by mouth every morning.  05/13/14   Historical Provider, MD  ondansetron (ZOFRAN ODT) 4 MG disintegrating tablet Take 1 tablet (4 mg total) by mouth every 8 (eight) hours as needed. 08/14/14   Fredia Sorrow, MD  polyethylene glycol powder (GLYCOLAX/MIRALAX) powder Take 17 g by mouth daily. 02/03/14   Historical Provider, MD  promethazine (PHENERGAN) 25 MG tablet Take 25 mg by mouth 3 (three) times daily as needed. For nausea and/or vimiting 02/03/14   Historical Provider, MD  traMADol (ULTRAM) 50 MG tablet Take 50 mg by mouth 2 (two) times daily. 02/03/14   Historical Provider, MD  traZODone (DESYREL) 50 MG tablet Take 1 tablet (50 mg total) by mouth at bedtime. 02/27/14   Rosita Fire, MD   Triage Vitals: BP 185/91 mmHg  Pulse 95  Temp(Src) 98.4 F (36.9 C) (Oral)  Resp 21  Ht 5\' 5"  (1.651 m)  Wt 161 lb (73.029 kg)  BMI 26.79 kg/m2  SpO2 96% Physical Exam  Constitutional: She is oriented to person, place, and time. She appears well-developed and well-nourished. No distress.  On spine board with stiff C-collar in place.  HENT:  Head: Normocephalic and atraumatic.  Eyes: Conjunctivae and EOM are normal.  Neck: No JVD present. No tracheal deviation present.  Mild tenderness in midline posteriorly.   Cardiovascular: Normal rate, regular rhythm and normal heart sounds.   No murmur heard. Pulmonary/Chest: Effort normal and breath sounds normal. She has no wheezes. She has no rales.  Abdominal: Soft. Bowel sounds are normal. She exhibits no distension and no mass. There is no tenderness.  Musculoskeletal: Normal range of motion. She exhibits no edema.  Moderate tenderness from mid thoracic spine through lower lumbar spine.  Mild pain with passive range of motion of the left knee, no swelling or deformity. Moderate swelling and moderate tenderness of the lateral aspect of the right ankle, with distal neurovascular intact.   Lymphadenopathy:    She has no cervical adenopathy.  Neurological: She is alert and oriented to person, place, and time. She has normal reflexes. No cranial nerve deficit.  Skin: Skin is warm and dry. No rash noted.  Psychiatric: She has a normal mood and affect. Her behavior is normal. Thought content normal.  Nursing note and vitals reviewed.   ED Course  Procedures (including critical care time) DIAGNOSTIC STUDIES: Oxygen Saturation is 96% on room air, normal by my interpretation.    COORDINATION OF CARE: 11:37  PM-Discussed treatment plan which includes morphine, Zofran, ankle and knee X-rays, head and C-Spine CT-scans, with pt at bedside and pt agreed to plan.   Imaging Review Dg Ankle Complete Right  09/04/2014   CLINICAL DATA:  Acute right ankle pain after falling off porch.  EXAM: RIGHT ANKLE - COMPLETE 3+ VIEW  COMPARISON:  None.  FINDINGS: Minimally displaced fracture is seen involving the distal portion of the fibula with overlying soft tissue swelling. The distal tibia and talar dome appear to be intact.  IMPRESSION: Minimally displaced fracture involving the distal fibula with overlying soft tissue swelling.   Electronically Signed   By: Sabino Dick M.D.   On: 09/04/2014 00:42   Ct Head Wo Contrast  09/04/2014   CLINICAL DATA:  Fall from porch to ground.  Neck stiffness.  EXAM: CT HEAD WITHOUT CONTRAST  CT CERVICAL SPINE WITHOUT CONTRAST  TECHNIQUE: Multidetector CT imaging of the head and cervical spine was performed following the standard protocol without intravenous contrast. Multiplanar CT image reconstructions of the cervical spine were also generated.  COMPARISON:  02/15/2006 head CT  FINDINGS: CT HEAD FINDINGS  Skull and Sinuses:Negative for fracture or destructive process.  The mastoids, middle ears, and imaged paranasal sinuses are clear.  Orbits: No acute abnormality.  Brain: No evidence of acute infarction, hemorrhage, hydrocephalus, or mass lesion/mass effect.  CT CERVICAL SPINE FINDINGS  Negative for acute fracture or subluxation. No prevertebral edema. No gross cervical canal hematoma. No significant osseous canal or foraminal stenosis.  Atherosclerotic calcification at the carotid bifurcations.  Prominent, but symmetric lingual tonsils.  IMPRESSION: No acute intracranial or cervical spine injury.   Electronically Signed   By: Jorje Guild M.D.   On: 09/04/2014 00:52   Ct Cervical Spine Wo Contrast  09/04/2014   CLINICAL DATA:  Fall from porch to ground.  Neck stiffness.  EXAM: CT HEAD WITHOUT CONTRAST  CT CERVICAL SPINE WITHOUT CONTRAST  TECHNIQUE: Multidetector CT imaging of the head and cervical spine was performed following the standard protocol without intravenous contrast. Multiplanar CT image reconstructions of the cervical spine were also generated.  COMPARISON:  02/15/2006 head CT  FINDINGS: CT HEAD FINDINGS  Skull and Sinuses:Negative for fracture or destructive process. The mastoids, middle ears, and imaged paranasal sinuses are clear.  Orbits: No acute abnormality.  Brain: No evidence of acute infarction, hemorrhage, hydrocephalus, or mass lesion/mass effect.  CT CERVICAL SPINE FINDINGS  Negative for acute fracture or subluxation. No prevertebral edema. No gross cervical canal hematoma. No significant osseous canal or foraminal stenosis.  Atherosclerotic calcification at the carotid bifurcations.  Prominent, but symmetric lingual tonsils.  IMPRESSION: No acute intracranial or cervical spine injury.   Electronically Signed   By: Jorje Guild M.D.   On: 09/04/2014 00:52   Dg Knee Complete 4 Views Left  09/04/2014   CLINICAL DATA:  [address] 2 feet off porch, felt RIGHT ankle pop. LEFT knee pain.  EXAM: LEFT KNEE - COMPLETE 4+ VIEW  COMPARISON:  None.  FINDINGS:  There is no evidence of fracture, dislocation, or joint effusion. There is no evidence of arthropathy or other focal bone abnormality. Soft tissues are unremarkable.  IMPRESSION: Negative.   Electronically Signed   By: Elon Alas   On: 09/04/2014 00:46  Images viewed by me.   MDM   Final diagnoses:  Fall at home, initial encounter  Closed avulsion fracture of lateral malleolus of right fibula, initial encounter    Fall with injury to right ankle. X-rays show  nondisplaced fracture of the lateral malleolus. She is placed in a cam walker. She has crutches at home but I'm concerned about her ability to use them so she is given a prescription for a walker. She is given a prescription for oxycodone-acetaminophen for pain. She is referred to orthopedics for follow-up.  I personally performed the services described in this documentation, which was scribed in my presence. The recorded information has been reviewed and is accurate.      Mandy Fuel, MD 72/89/79 [id number]

## 2014-09-03 NOTE — ED Notes (Signed)
EDP notified of patient pain and status on back board. Pain meds ordered and given.

## 2014-09-04 DIAGNOSIS — S8992XA Unspecified injury of left lower leg, initial encounter: Secondary | ICD-10-CM | POA: Diagnosis not present

## 2014-09-04 DIAGNOSIS — S199XXA Unspecified injury of neck, initial encounter: Secondary | ICD-10-CM | POA: Diagnosis not present

## 2014-09-04 DIAGNOSIS — M542 Cervicalgia: Secondary | ICD-10-CM | POA: Diagnosis not present

## 2014-09-04 DIAGNOSIS — M25562 Pain in left knee: Secondary | ICD-10-CM | POA: Diagnosis not present

## 2014-09-04 DIAGNOSIS — S8264XA Nondisplaced fracture of lateral malleolus of right fibula, initial encounter for closed fracture: Secondary | ICD-10-CM | POA: Diagnosis not present

## 2014-09-04 DIAGNOSIS — S82831A Other fracture of upper and lower end of right fibula, initial encounter for closed fracture: Secondary | ICD-10-CM | POA: Diagnosis not present

## 2014-09-04 DIAGNOSIS — S0990XA Unspecified injury of head, initial encounter: Secondary | ICD-10-CM | POA: Diagnosis not present

## 2014-09-04 MED ORDER — OXYCODONE-ACETAMINOPHEN 5-325 MG PO TABS: 1.0000 | ORAL_TABLET | ORAL | Status: DC | PRN

## 2014-09-04 MED ORDER — MORPHINE SULFATE 4 MG/ML IJ SOLN
4.0000 mg | Freq: Once | INTRAMUSCULAR | Status: AC
  Administered 2014-09-04: 4 mg via INTRAVENOUS
  Filled 2014-09-04: qty 1

## 2014-09-04 NOTE — Discharge Instructions (Signed)
Use crutches or walker or cane as needed. Wear the Cam Walker until told by orthopedic doctor that you don't need it.  Ankle Fracture A fracture is a break in a bone. The ankle joint is made up of three bones. These include the lower (distal)sections of your lower leg bones, called the tibia and fibula, along with a bone in your foot, called the talus. Depending on how bad the break is and if more than one ankle joint bone is broken, a cast or splint is used to protect and keep your injured bone from moving while it heals. Sometimes, surgery is required to help the fracture heal properly.  There are two general types of fractures:  Stable fracture. This includes a single fracture line through one bone, with no injury to ankle ligaments. A fracture of the talus that does not have any displacement (movement of the bone on either side of the fracture line) is also stable.  Unstable fracture. This includes more than one fracture line through one or more bones in the ankle joint. It also includes fractures that have displacement of the bone on either side of the fracture line. CAUSES  A direct blow to the ankle.   Quickly and severely twisting your ankle.  Trauma, such as a car accident or falling from a significant height. RISK FACTORS You may be at a higher risk of ankle fracture if:  You have certain medical conditions.  You are involved in high-impact sports.  You are involved in a high-impact car accident. SIGNS AND SYMPTOMS   Tender and swollen ankle.  Bruising around the injured ankle.  Pain on movement of the ankle.  Difficulty walking or putting weight on the ankle.  A cold foot below the site of the ankle injury. This can occur if the blood vessels passing through your injured ankle were also damaged.  Numbness in the foot below the site of the ankle injury. DIAGNOSIS  An ankle fracture is usually diagnosed with a physical exam and X-rays. A CT scan may also be required  for complex fractures. TREATMENT  Stable fractures are treated with a cast or splint and using crutches to avoid putting weight on your injured ankle. This is followed by an ankle strengthening program. Some patients require a special type of cast, depending on other medical problems they may have. Unstable fractures require surgery to ensure the bones heal properly. Your health care provider will tell you what type of fracture you have and the best treatment for your condition. HOME CARE INSTRUCTIONS   Review correct crutch use with your health care provider and use your crutches as directed. Safe use of crutches is extremely important. Misuse of crutches can cause you to fall or cause injury to nerves in your hands or armpits.  Do not put weight or pressure on the injured ankle until directed by your health care provider.  To lessen the swelling, keep the injured leg elevated while sitting or lying down.  Apply ice to the injured area:  Put ice in a plastic bag.  Place a towel between your cast and the bag.  Leave the ice on for 20 minutes, 2-3 times a day.  If you have a plaster or fiberglass cast:  Do not try to scratch the skin under the cast with any objects. This can increase your risk of skin infection.  Check the skin around the cast every day. You may put lotion on any red or sore areas.  Keep your  cast dry and clean.  If you have a plaster splint:  Wear the splint as directed.  You may loosen the elastic around the splint if your toes become numb, tingle, or turn cold or blue.  Do not put pressure on any part of your cast or splint; it may break. Rest your cast only on a pillow the first 24 hours until it is fully hardened.  Your cast or splint can be protected during bathing with a plastic bag sealed to your skin with medical tape. Do not lower the cast or splint into water.  Take medicines as directed by your health care provider. Only take over-the-counter or  prescription medicines for pain, discomfort, or fever as directed by your health care provider.  Do not drive a vehicle until your health care provider specifically tells you it is safe to do so.  If your health care provider has given you a follow-up appointment, it is very important to keep that appointment. Not keeping the appointment could result in a chronic or permanent injury, pain, and disability. If you have any problem keeping the appointment, call the facility for assistance. SEEK MEDICAL CARE IF: You develop increased swelling or discomfort. SEEK IMMEDIATE MEDICAL CARE IF:   Your cast gets damaged or breaks.  You have continued severe pain.  You develop new pain or swelling after the cast was put on.  Your skin or toenails below the injury turn blue or gray.  Your skin or toenails below the injury feel cold, numb, or have loss of sensitivity to touch.  There is a bad smell or pus draining from under the cast. MAKE SURE YOU:   Understand these instructions.  Will watch your condition.  Will get help right away if you are not doing well or get worse. Document Released: 09/05/2000 Document Revised: 09/13/2013 Document Reviewed: 04/07/2013 [facility] Patient Information 2015 [place], [place]. This information is not intended to replace advice given to you by your health care provider. Make sure you discuss any questions you have with your health care provider.  Fall Prevention and Home Safety Falls cause injuries and can affect all age groups. It is possible to use preventive measures to significantly decrease the likelihood of falls. There are many simple measures which can make your home safer and prevent falls. OUTDOORS  Repair cracks and edges of walkways and driveways.  Remove high doorway thresholds.  Trim shrubbery on the main path into your home.  Have good outside lighting.  Clear walkways of tools, rocks, debris, and clutter.  Check that handrails are not  broken and are securely fastened. Both sides of steps should have handrails.  Have leaves, snow, and ice cleared regularly.  Use sand or salt on walkways during winter months.  In the garage, clean up grease or oil spills. BATHROOM  Install night lights.  Install grab bars by the toilet and in the tub and shower.  Use non-skid mats or decals in the tub or shower.  Place a plastic non-slip stool in the shower to sit on, if needed.  Keep floors dry and clean up all water on the floor immediately.  Remove soap buildup in the tub or shower on a regular basis.  Secure bath mats with non-slip, double-sided rug tape.  Remove throw rugs and tripping hazards from the floors. BEDROOMS  Install night lights.  Make sure a bedside light is easy to reach.  Do not use oversized bedding.  Keep a telephone by your bedside.  Have  a firm chair with side arms to use for getting dressed.  Remove throw rugs and tripping hazards from the floor. KITCHEN  Keep handles on pots and pans turned toward the center of the stove. Use back burners when possible.  Clean up spills quickly and allow time for drying.  Avoid walking on wet floors.  Avoid hot utensils and knives.  Position shelves so they are not too high or low.  Place commonly used objects within easy reach.  If necessary, use a sturdy step stool with a grab bar when reaching.  Keep electrical cables out of the way.  Do not use floor polish or wax that makes floors slippery. If you must use wax, use non-skid floor wax.  Remove throw rugs and tripping hazards from the floor. STAIRWAYS  Never leave objects on stairs.  Place handrails on both sides of stairways and use them. Fix any loose handrails. Make sure handrails on both sides of the stairways are as long as the stairs.  Check carpeting to make sure it is firmly attached along stairs. Make repairs to worn or loose carpet promptly.  Avoid placing throw rugs at the top  or bottom of stairways, or properly secure the rug with carpet tape to prevent slippage. Get rid of throw rugs, if possible.  Have an electrician put in a light switch at the top and bottom of the stairs. OTHER FALL PREVENTION TIPS  Wear low-heel or rubber-soled shoes that are supportive and fit well. Wear closed toe shoes.  When using a stepladder, make sure it is fully opened and both spreaders are firmly locked. Do not climb a closed stepladder.  Add color or contrast paint or tape to grab bars and handrails in your home. Place contrasting color strips on first and last steps.  Learn and use mobility aids as needed. Install an electrical emergency response system.  Turn on lights to avoid dark areas. Replace light bulbs that burn out immediately. Get light switches that glow.  Arrange furniture to create clear pathways. Keep furniture in the same place.  Firmly attach carpet with non-skid or double-sided tape.  Eliminate uneven floor surfaces.  Select a carpet pattern that does not visually hide the edge of steps.  Be aware of all pets. OTHER HOME SAFETY TIPS  Set the water temperature for 120 F (48.8 C).  Keep emergency numbers on or near the telephone.  Keep smoke detectors on every level of the home and near sleeping areas. Document Released: 08/29/2002 Document Revised: 03/09/2012 Document Reviewed: 11/28/2011 [facility] Patient Information 2015 [place], [place]. This information is not intended to replace advice given to you by your health care provider. Make sure you discuss any questions you have with your health care provider.  Acetaminophen; Oxycodone tablets What is this medicine? ACETAMINOPHEN; OXYCODONE (a set a MEE noe fen; ox i KOE done) is a pain reliever. It is used to treat mild to moderate pain. This medicine may be used for other purposes; ask your health care provider or pharmacist if you have questions. COMMON BRAND NAME(S): Endocet, Magnacet, Narvox,  Percocet, Perloxx, Primalev, Primlev, Roxicet, Xolox What should I tell my health care provider before I take this medicine? They need to know if you have any of these conditions: -brain tumor -Crohn's disease, inflammatory bowel disease, or ulcerative colitis -drug abuse or addiction -head injury -heart or circulation problems -if you often drink alcohol -kidney disease or problems going to the bathroom -liver disease -lung disease, asthma, or breathing problems -  an unusual or allergic reaction to acetaminophen, oxycodone, other opioid analgesics, other medicines, foods, dyes, or preservatives -pregnant or trying to get pregnant -breast-feeding How should I use this medicine? Take this medicine by mouth with a full glass of water. Follow the directions on the prescription label. Take your medicine at regular intervals. Do not take your medicine more often than directed. Talk to your pediatrician regarding the use of this medicine in children. Special care may be needed. Patients over 67 years old may have a stronger reaction and need a smaller dose. Overdosage: If you think you have taken too much of this medicine contact a poison control center or emergency room at once. NOTE: This medicine is only for you. Do not share this medicine with others. What if I miss a dose? If you miss a dose, take it as soon as you can. If it is almost time for your next dose, take only that dose. Do not take double or extra doses. What may interact with this medicine? -alcohol -antihistamines -barbiturates like amobarbital, butalbital, butabarbital, methohexital, pentobarbital, phenobarbital, thiopental, and secobarbital -benztropine -drugs for bladder problems like solifenacin, trospium, oxybutynin, tolterodine, hyoscyamine, and methscopolamine -drugs for breathing problems like ipratropium and tiotropium -drugs for certain stomach or intestine problems like propantheline, homatropine methylbromide,  glycopyrrolate, atropine, belladonna, and dicyclomine -general anesthetics like etomidate, ketamine, nitrous oxide, propofol, desflurane, enflurane, halothane, isoflurane, and sevoflurane -medicines for depression, anxiety, or psychotic disturbances -medicines for sleep -muscle relaxants -naltrexone -narcotic medicines (opiates) for pain -phenothiazines like perphenazine, thioridazine, chlorpromazine, mesoridazine, fluphenazine, prochlorperazine, promazine, and trifluoperazine -scopolamine -tramadol -trihexyphenidyl This list may not describe all possible interactions. Give your health care provider a list of all the medicines, herbs, non-prescription drugs, or dietary supplements you use. Also tell them if you smoke, drink alcohol, or use illegal drugs. Some items may interact with your medicine. What should I watch for while using this medicine? Tell your doctor or health care professional if your pain does not go away, if it gets worse, or if you have new or a different type of pain. You may develop tolerance to the medicine. Tolerance means that you will need a higher dose of the medication for pain relief. Tolerance is normal and is expected if you take this medicine for a long time. Do not suddenly stop taking your medicine because you may develop a severe reaction. Your body becomes used to the medicine. This does NOT mean you are addicted. Addiction is a behavior related to getting and using a drug for a non-medical reason. If you have pain, you have a medical reason to take pain medicine. Your doctor will tell you how much medicine to take. If your doctor wants you to stop the medicine, the dose will be slowly lowered over time to avoid any side effects. You may get drowsy or dizzy. Do not drive, use machinery, or do anything that needs mental alertness until you know how this medicine affects you. Do not stand or sit up quickly, especially if you are an older patient. This reduces the risk  of dizzy or fainting spells. Alcohol may interfere with the effect of this medicine. Avoid alcoholic drinks. There are different types of narcotic medicines (opiates) for pain. If you take more than one type at the same time, you may have more side effects. Give your health care provider a list of all medicines you use. Your doctor will tell you how much medicine to take. Do not take more medicine than directed. Call  emergency for help if you have problems breathing. The medicine will cause constipation. Try to have a bowel movement at least every 2 to 3 days. If you do not have a bowel movement for 3 days, call your doctor or health care professional. Do not take Tylenol (acetaminophen) or medicines that have acetaminophen with this medicine. Too much acetaminophen can be very dangerous. Many nonprescription medicines contain acetaminophen. Always read the labels carefully to avoid taking more acetaminophen. What side effects may I notice from receiving this medicine? Side effects that you should report to your doctor or health care professional as soon as possible: -allergic reactions like skin rash, itching or hives, swelling of the face, lips, or tongue -breathing difficulties, wheezing -confusion -light headedness or fainting spells -severe stomach pain -unusually weak or tired -yellowing of the skin or the whites of the eyes Side effects that usually do not require medical attention (report to your doctor or health care professional if they continue or are bothersome): -dizziness -drowsiness -nausea -vomiting This list may not describe all possible side effects. Call your doctor for medical advice about side effects. You may report side effects to FDA at 1-800-FDA-1088. Where should I keep my medicine? Keep out of the reach of children. This medicine can be abused. Keep your medicine in a safe place to protect it from theft. Do not share this medicine with anyone. Selling or giving away  this medicine is dangerous and against the law. Store at room temperature between 20 and 25 degrees C (68 and 77 degrees F). Keep container tightly closed. Protect from light. This medicine may cause accidental overdose and death if it is taken by other adults, children, or pets. Flush any unused medicine down the toilet to reduce the chance of harm. Do not use the medicine after the expiration date. NOTE: This sheet is a summary. It may not cover all possible information. If you have questions about this medicine, talk to your doctor, pharmacist, or health care provider.  2015, [employer]. (2013-05-02 13:17:35)

## 2014-09-04 NOTE — ED Notes (Signed)
Discharge instructions given, pt demonstrated teach back and verbal understanding of boot and follow up. No concerns voiced.

## 2014-09-07 ENCOUNTER — Encounter: Payer: Self-pay | Admitting: Orthopedic Surgery

## 2014-09-07 ENCOUNTER — Ambulatory Visit (INDEPENDENT_AMBULATORY_CARE_PROVIDER_SITE_OTHER): Payer: Medicare Other | Admitting: Orthopedic Surgery

## 2014-09-07 VITALS — BP 143/74 | Ht 65.0 in | Wt 161.0 lb

## 2014-09-07 DIAGNOSIS — S82401A Unspecified fracture of shaft of right fibula, initial encounter for closed fracture: Secondary | ICD-10-CM | POA: Diagnosis not present

## 2014-09-07 DIAGNOSIS — S82409A Unspecified fracture of shaft of unspecified fibula, initial encounter for closed fracture: Secondary | ICD-10-CM | POA: Insufficient documentation

## 2014-09-07 MED ORDER — OXYCODONE-ACETAMINOPHEN 5-325 MG PO TABS: 1.0000 | ORAL_TABLET | Freq: Four times a day (QID) | ORAL | Status: DC | PRN

## 2014-09-07 NOTE — Patient Instructions (Signed)
Boot for one month

## 2014-09-07 NOTE — Progress Notes (Signed)
[name], female   DOB: [birth year], 53 y.o.   MRN: [id number] [name], female   DOB: 09/27/1960, 53 y.o.   MRN: [id number]  Chief Complaint  Patient presents with  . Ankle Injury    right ankle fracture, DOI 09/03/14    HPI [name] is a 53 y.o. female.  She fell on December 13 and injured her right ankle sustaining a nondisplaced distal fibular fracture Weber a type injury. She complains of sharp stabbing aching pain swelling with numbness and tingling. She says the pain is constant and its 9 out of 10. She is on Percocet for pain previously on hydrocodone for other reasons  HPI  Review of Systems Review of Systems Her review of systems is notable for fatigue vision problems depression nausea constipation back pain swollen joint stiff joints muscle weakness joint pain and the remaining systems were normal   Past Medical History  Diagnosis Date  . Hypothyroid   . Anxiety   . COPD (chronic obstructive pulmonary disease)   . GERD (gastroesophageal reflux disease)   . PUD (peptic ulcer disease)     ? remote past, no reports from [facility] or [facility], states possibly Dr. Laural Golden.   . Hiatal hernia   . Chronic abdominal pain   . Diabetes mellitus     type I  . Hyperlipidemia   . Stroke   . Polyneuropathy in diabetes   . Unspecified polyarthropathy or polyarthritis, site unspecified     Past Surgical History  Procedure Laterality Date  . Abdominal hysterectomy    . Tonsillectomy    . Appendectomy    . Bladder repair    . Esophagogastroduodenoscopy      in remote past, unclear who performed. No op notes from [facility] or [facility] (pt thought Dr. Laural Golden)  . Esophagogastroduodenoscopy  02/12/2012    Dr. Gala Romney:  Normal esophagus status post 73 F dilation. Small hiatal hernia. mild chronic gastritis  . Esophagogastroduodenoscopy N/A 02/21/2014    BBC:WUGQBV exudative reflux esophagitis-rule out Candida esophagitis.    Family History  Problem  Relation Age of Onset  . Liver cancer Mother     remission for 2 years  . Colon cancer Neg Hx     Social History History  Substance Use Topics  . Smoking status: Current Every Day Smoker -- 0.50 packs/day for 15 years    Types: Cigarettes  . Smokeless tobacco: Not on file  . Alcohol Use: No    Allergies  Allergen Reactions  . Phenytoin Anaphylaxis    Reaction: SJS (STEVENS JOHNSON SYNDROME)  . Sulfonamide Derivatives Nausea And Vomiting  . Tetracyclines & Related Nausea And Vomiting    Current Outpatient Prescriptions  Medication Sig Dispense Refill  . albuterol (PROVENTIL HFA;VENTOLIN HFA) 108 (90 BASE) MCG/ACT inhaler Inhale 2 puffs into the lungs every 6 (six) hours as needed. For shortness of breath    . DULoxetine (CYMBALTA) 20 MG capsule Take 20 mg by mouth every evening.     . escitalopram (LEXAPRO) 10 MG tablet Take 10 mg by mouth at bedtime.     . gabapentin (NEURONTIN) 100 MG capsule Take 200 mg by mouth 4 (four) times daily.     Marland Kitchen HYDROcodone-acetaminophen (NORCO/VICODIN) 5-325 MG per tablet Take 1-2 tablets by mouth every 6 (six) hours as needed. 10 tablet 0  . insulin aspart (NOVOLOG FLEXPEN) 100 UNIT/ML FlexPen Inject 5 Units into the skin 3 (three) times daily with meals. Takes 5 units with  meals in addition to sliding scale for levels over 150    . Insulin Glargine (LANTUS SOLOSTAR) 100 UNIT/ML Solostar Pen Inject 16 Units into the skin at bedtime.    Marland Kitchen levothyroxine (SYNTHROID, LEVOTHROID) 137 MCG tablet Take 1 tablet (137 mcg total) by mouth every morning. For thyroid hormone replacement    . lisinopril (PRINIVIL,ZESTRIL) 20 MG tablet Take 1 tablet (20 mg total) by mouth daily. 30 tablet 0  . metroNIDAZOLE (FLAGYL) 500 MG tablet Take 1 tablet (500 mg total) by mouth 2 (two) times daily. 14 tablet 0  . naproxen (NAPROSYN) 500 MG tablet Take 1 tablet (500 mg total) by mouth 2 (two) times daily. 30 tablet 0  . NEXIUM 40 MG capsule Take 49 mg by mouth every morning.      . ondansetron (ZOFRAN ODT) 4 MG disintegrating tablet Take 1 tablet (4 mg total) by mouth every 8 (eight) hours as needed. 10 tablet 0  . polyethylene glycol powder (GLYCOLAX/MIRALAX) powder Take 17 g by mouth daily.    . promethazine (PHENERGAN) 25 MG tablet Take 25 mg by mouth 3 (three) times daily as needed. For nausea and/or vimiting    . traMADol (ULTRAM) 50 MG tablet Take 50 mg by mouth 2 (two) times daily.    . traZODone (DESYREL) 50 MG tablet Take 1 tablet (50 mg total) by mouth at bedtime. 30 tablet 3  . doxycycline (VIBRAMYCIN) 100 MG capsule Take 1 capsule (100 mg total) by mouth 2 (two) times daily. (Patient not taking: Reported on 09/07/2014) 14 capsule 0  . oxyCODONE-acetaminophen (PERCOCET) 5-325 MG per tablet Take 1 tablet by mouth every 4 (four) hours as needed for moderate pain. (Patient not taking: Reported on 09/07/2014) 6 tablet 0  . oxyCODONE-acetaminophen (PERCOCET) 5-325 MG per tablet Take 1 tablet by mouth every 6 (six) hours as needed for moderate pain. 60 tablet 0   No current facility-administered medications for this visit.       Physical Exam Blood pressure 143/74, height 5\' 5"  (1.651 m), weight 161 lb (73.029 kg). Physical Exam Very small framed well-developed well-nourished. Oriented 3 grooming normal mood and flat affect flat gait supported by walking brace. Tenderness swelling and ecchymosis around the ankle normal passive range of motion no instability muscle tone normal skin intact sensation normal good pulses no lymphadenopathy  Data Reviewed Independent interpretation Weber a ankle fracture intact mortise  Assessment    Encounter Diagnosis  Name Primary?  . Fibula fracture, right, closed, initial encounter Yes        Plan    Cam Walker x-ray in a month Meds ordered this encounter  Medications  . oxyCODONE-acetaminophen (PERCOCET) 5-325 MG per tablet    Sig: Take 1 tablet by mouth every 6 (six) hours as needed for moderate pain.     Dispense:  60 tablet    Refill:  0

## 2014-09-08 ENCOUNTER — Other Ambulatory Visit: Payer: Self-pay | Admitting: *Deleted

## 2014-09-08 MED ORDER — OXYCODONE-ACETAMINOPHEN 5-325 MG PO TABS: 1.0000 | ORAL_TABLET | Freq: Four times a day (QID) | ORAL | Status: DC | PRN

## 2014-09-18 MED FILL — Oxycodone w/ Acetaminophen Tab 5-325 MG: ORAL | Qty: 6 | Status: AC

## 2014-10-04 ENCOUNTER — Telehealth: Payer: Self-pay | Admitting: Orthopedic Surgery

## 2014-10-04 DIAGNOSIS — E785 Hyperlipidemia, unspecified: Secondary | ICD-10-CM | POA: Diagnosis not present

## 2014-10-04 DIAGNOSIS — F17200 Nicotine dependence, unspecified, uncomplicated: Secondary | ICD-10-CM | POA: Diagnosis not present

## 2014-10-04 DIAGNOSIS — J449 Chronic obstructive pulmonary disease, unspecified: Secondary | ICD-10-CM | POA: Diagnosis not present

## 2014-10-04 DIAGNOSIS — K219 Gastro-esophageal reflux disease without esophagitis: Secondary | ICD-10-CM | POA: Diagnosis not present

## 2014-10-04 NOTE — Telephone Encounter (Signed)
Okay to have her come in for new boot?

## 2014-10-04 NOTE — Telephone Encounter (Signed)
Patient is calling stating that the straps on the boot shes wearing has broken she only has one holding it together, she states that her ankle is twisting some and its causing her a lot of pain, she has an appointment to come in on Tuesday Jan 19th please advise?

## 2014-10-05 NOTE — Telephone Encounter (Signed)
Yes

## 2014-10-06 NOTE — Telephone Encounter (Signed)
Called patient to come in this morning for new boot, no answer, left vm

## 2014-10-10 ENCOUNTER — Ambulatory Visit (INDEPENDENT_AMBULATORY_CARE_PROVIDER_SITE_OTHER): Payer: Medicare Other

## 2014-10-10 ENCOUNTER — Ambulatory Visit (INDEPENDENT_AMBULATORY_CARE_PROVIDER_SITE_OTHER): Payer: Medicare Other | Admitting: Orthopedic Surgery

## 2014-10-10 ENCOUNTER — Encounter: Payer: Self-pay | Admitting: Orthopedic Surgery

## 2014-10-10 VITALS — BP 154/89 | Ht 65.0 in | Wt 161.0 lb

## 2014-10-10 DIAGNOSIS — S82891A Other fracture of right lower leg, initial encounter for closed fracture: Secondary | ICD-10-CM

## 2014-10-10 DIAGNOSIS — S99921A Unspecified injury of right foot, initial encounter: Secondary | ICD-10-CM | POA: Diagnosis not present

## 2014-10-10 MED ORDER — OXYCODONE-ACETAMINOPHEN 5-325 MG PO TABS: 1.0000 | ORAL_TABLET | Freq: Three times a day (TID) | ORAL | Status: DC | PRN

## 2014-10-10 NOTE — Patient Instructions (Addendum)
The fracture is healed  You may apply weight as tolerated. Use ice as needed for swelling. Exercise her ankle by drawing the alphabet with your foot.  Boot 4 more weeks
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
## 2014-10-10 NOTE — Telephone Encounter (Signed)
Patient has office visit today.

## 2014-11-06 ENCOUNTER — Ambulatory Visit: Payer: Medicare Other | Admitting: Gastroenterology

## 2014-11-07 ENCOUNTER — Ambulatory Visit: Payer: Medicare Other | Admitting: Orthopedic Surgery

## 2014-11-07 ENCOUNTER — Telehealth: Payer: Self-pay | Admitting: General Practice

## 2014-11-07 ENCOUNTER — Encounter: Payer: Self-pay | Admitting: Gastroenterology

## 2014-11-07 NOTE — Telephone Encounter (Signed)
I called the patient to reschedule her appointment from 2/15, no answer and unable to leave a message.

## 2014-11-20 ENCOUNTER — Encounter: Payer: Self-pay | Admitting: Orthopedic Surgery

## 2014-11-20 ENCOUNTER — Ambulatory Visit: Payer: Medicare Other | Admitting: Orthopedic Surgery

## 2014-11-29 ENCOUNTER — Telehealth: Payer: Self-pay | Admitting: Internal Medicine

## 2014-11-29 ENCOUNTER — Ambulatory Visit: Payer: Medicare Other | Admitting: Gastroenterology

## 2014-11-29 ENCOUNTER — Ambulatory Visit: Payer: Self-pay | Admitting: Gastroenterology

## 2014-11-29 ENCOUNTER — Encounter: Payer: Self-pay | Admitting: Gastroenterology

## 2014-11-29 NOTE — Telephone Encounter (Signed)
PATIENT WAS A NO SHOW 11/29/14 AND LETTER WAS SENT

## 2015-01-27 ENCOUNTER — Emergency Department (HOSPITAL_COMMUNITY)
Admission: EM | Admit: 2015-01-27 | Discharge: 2015-01-28 | Disposition: A | Discharge status: Home / Self Care | Payer: Medicare Other | Attending: Emergency Medicine | Admitting: Emergency Medicine

## 2015-01-27 ENCOUNTER — Emergency Department (HOSPITAL_COMMUNITY): Payer: Medicare Other

## 2015-01-27 ENCOUNTER — Encounter (HOSPITAL_COMMUNITY): Payer: Self-pay | Admitting: Emergency Medicine

## 2015-01-27 DIAGNOSIS — M25571 Pain in right ankle and joints of right foot: Secondary | ICD-10-CM | POA: Diagnosis not present

## 2015-01-27 DIAGNOSIS — S82102A Unspecified fracture of upper end of left tibia, initial encounter for closed fracture: Secondary | ICD-10-CM | POA: Diagnosis not present

## 2015-01-27 DIAGNOSIS — Z720 Tobacco use: Secondary | ICD-10-CM | POA: Insufficient documentation

## 2015-01-27 DIAGNOSIS — Z8711 Personal history of peptic ulcer disease: Secondary | ICD-10-CM | POA: Diagnosis not present

## 2015-01-27 DIAGNOSIS — S82202A Unspecified fracture of shaft of left tibia, initial encounter for closed fracture: Secondary | ICD-10-CM | POA: Insufficient documentation

## 2015-01-27 DIAGNOSIS — Z7951 Long term (current) use of inhaled steroids: Secondary | ICD-10-CM | POA: Insufficient documentation

## 2015-01-27 DIAGNOSIS — S82002A Unspecified fracture of left patella, initial encounter for closed fracture: Secondary | ICD-10-CM | POA: Diagnosis not present

## 2015-01-27 DIAGNOSIS — G8929 Other chronic pain: Secondary | ICD-10-CM | POA: Insufficient documentation

## 2015-01-27 DIAGNOSIS — W19XXXA Unspecified fall, initial encounter: Secondary | ICD-10-CM | POA: Diagnosis not present

## 2015-01-27 DIAGNOSIS — J449 Chronic obstructive pulmonary disease, unspecified: Secondary | ICD-10-CM | POA: Diagnosis not present

## 2015-01-27 DIAGNOSIS — Z79899 Other long term (current) drug therapy: Secondary | ICD-10-CM | POA: Diagnosis not present

## 2015-01-27 DIAGNOSIS — E109 Type 1 diabetes mellitus without complications: Secondary | ICD-10-CM | POA: Diagnosis not present

## 2015-01-27 DIAGNOSIS — Z8673 Personal history of transient ischemic attack (TIA), and cerebral infarction without residual deficits: Secondary | ICD-10-CM | POA: Insufficient documentation

## 2015-01-27 DIAGNOSIS — S82891A Other fracture of right lower leg, initial encounter for closed fracture: Secondary | ICD-10-CM

## 2015-01-27 DIAGNOSIS — Z8719 Personal history of other diseases of the digestive system: Secondary | ICD-10-CM | POA: Insufficient documentation

## 2015-01-27 DIAGNOSIS — S82831A Other fracture of upper and lower end of right fibula, initial encounter for closed fracture: Secondary | ICD-10-CM | POA: Diagnosis not present

## 2015-01-27 DIAGNOSIS — S8251XA Displaced fracture of medial malleolus of right tibia, initial encounter for closed fracture: Secondary | ICD-10-CM | POA: Diagnosis not present

## 2015-01-27 DIAGNOSIS — Y9289 Other specified places as the place of occurrence of the external cause: Secondary | ICD-10-CM | POA: Insufficient documentation

## 2015-01-27 DIAGNOSIS — Z794 Long term (current) use of insulin: Secondary | ICD-10-CM | POA: Insufficient documentation

## 2015-01-27 DIAGNOSIS — E039 Hypothyroidism, unspecified: Secondary | ICD-10-CM | POA: Insufficient documentation

## 2015-01-27 DIAGNOSIS — S92001A Unspecified fracture of right calcaneus, initial encounter for closed fracture: Secondary | ICD-10-CM | POA: Insufficient documentation

## 2015-01-27 DIAGNOSIS — Z8739 Personal history of other diseases of the musculoskeletal system and connective tissue: Secondary | ICD-10-CM | POA: Diagnosis not present

## 2015-01-27 DIAGNOSIS — F419 Anxiety disorder, unspecified: Secondary | ICD-10-CM | POA: Diagnosis not present

## 2015-01-27 DIAGNOSIS — Y998 Other external cause status: Secondary | ICD-10-CM | POA: Insufficient documentation

## 2015-01-27 DIAGNOSIS — Y9389 Activity, other specified: Secondary | ICD-10-CM | POA: Diagnosis not present

## 2015-01-27 DIAGNOSIS — S99911A Unspecified injury of right ankle, initial encounter: Secondary | ICD-10-CM | POA: Diagnosis present

## 2015-01-27 DIAGNOSIS — S82892A Other fracture of left lower leg, initial encounter for closed fracture: Secondary | ICD-10-CM

## 2015-01-27 DIAGNOSIS — X58XXXA Exposure to other specified factors, initial encounter: Secondary | ICD-10-CM | POA: Insufficient documentation

## 2015-01-27 DIAGNOSIS — M25569 Pain in unspecified knee: Secondary | ICD-10-CM | POA: Diagnosis not present

## 2015-01-27 MED ORDER — ONDANSETRON 4 MG PO TBDP
4.0000 mg | ORAL_TABLET | Freq: Once | ORAL | Status: AC
  Administered 2015-01-27: 4 mg via ORAL
  Filled 2015-01-27: qty 1

## 2015-01-27 MED ORDER — OXYCODONE-ACETAMINOPHEN 5-325 MG PO TABS
1.0000 | ORAL_TABLET | Freq: Once | ORAL | Status: AC
  Administered 2015-01-27: 1 via ORAL
  Filled 2015-01-27: qty 1

## 2015-01-27 MED ORDER — HYDROMORPHONE HCL 1 MG/ML IJ SOLN
1.0000 mg | Freq: Once | INTRAMUSCULAR | Status: AC
  Administered 2015-01-27: 1 mg via INTRAMUSCULAR
  Filled 2015-01-27: qty 1

## 2015-01-27 NOTE — ED Provider Notes (Signed)
CSN: [id number]     Arrival date & time 01/27/15  2043 History  This chart was scribed for Milton Ferguson, MD by Eustaquio Maize, ED Scribe. This patient was seen in room APA06/APA06 and the patient's care was started at 8:52 PM.    Chief Complaint  Patient presents with  . Fall  . Knee Pain  . Ankle Pain   Patient is a 54 y.o. female presenting with fall, knee pain, and ankle pain. The history is provided by the patient. No language interpreter was used.  Fall This is a new problem. Pertinent negatives include no chest pain, no abdominal pain and no headaches.  Knee Pain Location:  Knee Knee location:  L knee Pain details:    Quality:  Unable to specify   Radiates to:  Does not radiate   Severity:  Unable to specify   Onset quality:  Sudden Chronicity:  New Relieved by:  None tried Associated symptoms: no back pain and no fatigue   Ankle Pain Location:  Ankle Ankle location:  R ankle Pain details:    Quality:  Unable to specify   Radiates to:  Does not radiate   Onset quality:  Sudden Chronicity:  New Prior injury to area:  Yes Relieved by:  None tried Associated symptoms: no back pain and no fatigue     HPI Comments: [name] is a 54 y.o. female who presents to the Emergency Department complaining of sudden onset left knee and right ankle pain s/p ground level fall that occurred earlier today. Pt states that she missed a step causing her to twist her ankle and fall. She reports breaking her right ankle approximately 1 year ago. She has not taken anything for the pain. Pt denies any other symptoms.    Past Medical History  Diagnosis Date  . Hypothyroid   . Anxiety   . COPD (chronic obstructive pulmonary disease)   . GERD (gastroesophageal reflux disease)   . PUD (peptic ulcer disease)     ? remote past, no reports from [place] or [facility], states possibly Dr. Laural Golden.   . Hiatal hernia   . Chronic abdominal pain   . Diabetes mellitus     type I  . Hyperlipidemia    . Stroke   . Polyneuropathy in diabetes   . Unspecified polyarthropathy or polyarthritis, site unspecified    Past Surgical History  Procedure Laterality Date  . Abdominal hysterectomy    . Tonsillectomy    . Appendectomy    . Bladder repair    . Esophagogastroduodenoscopy      in remote past, unclear who performed. No op notes from [facility] or [facility] (pt thought Dr. Laural Golden)  . Esophagogastroduodenoscopy  02/12/2012    Dr. Gala Romney:  Normal esophagus status post 40 F dilation. Small hiatal hernia. mild chronic gastritis  . Esophagogastroduodenoscopy N/A 02/21/2014    YWV:[id number] exudative reflux esophagitis-rule out Candida esophagitis.   Family History  Problem Relation Age of Onset  . Liver cancer Mother     remission for 2 years  . Colon cancer Neg Hx    History  Substance Use Topics  . Smoking status: Current Every Day Smoker -- 0.50 packs/day for 15 years    Types: Cigarettes  . Smokeless tobacco: Not on file  . Alcohol Use: No   OB History    No data available     Review of Systems  Constitutional: Negative for appetite change and fatigue.  HENT: Negative for congestion, ear discharge and  sinus pressure.   Eyes: Negative for discharge.  Respiratory: Negative for cough.   Cardiovascular: Negative for chest pain.  Gastrointestinal: Negative for abdominal pain and diarrhea.  Genitourinary: Negative for frequency and hematuria.  Musculoskeletal: Positive for arthralgias (Right ankle pain. Left knee pain. ). Negative for back pain.  Skin: Negative for rash.  Neurological: Negative for seizures and headaches.  Psychiatric/Behavioral: Negative for hallucinations.      Allergies  Phenytoin; Sulfonamide derivatives; and Tetracyclines & related  Home Medications   Prior to Admission medications   Medication Sig Start Date End Date Taking? Authorizing Provider  albuterol (PROVENTIL HFA;VENTOLIN HFA) 108 (90 BASE) MCG/ACT inhaler Inhale 2 puffs into the lungs every 6 (six)  hours as needed. For shortness of breath 03/08/12  Yes Encarnacion Slates, NP  DULoxetine (CYMBALTA) 20 MG capsule Take 20 mg by mouth every evening.    Yes Historical Provider, MD  escitalopram (LEXAPRO) 10 MG tablet Take 10 mg by mouth at bedtime.    Yes Historical Provider, MD  fluticasone (FLONASE) 50 MCG/ACT nasal spray Place 2 sprays into both nostrils daily as needed for allergies or rhinitis.   Yes Historical Provider, MD  gabapentin (NEURONTIN) 100 MG capsule Take 200 mg by mouth 4 (four) times daily.    Yes Historical Provider, MD  Insulin Glargine (LANTUS SOLOSTAR) 100 UNIT/ML Solostar Pen Inject 16 Units into the skin at bedtime.   Yes Historical Provider, MD  insulin lispro (HUMALOG) 100 UNIT/ML injection Inject 5-10 Units into the skin 3 (three) times daily as needed for high blood sugar (Uses 3 timesdaily according to sliding scale).   Yes Historical Provider, MD  levothyroxine (SYNTHROID, LEVOTHROID) 137 MCG tablet Take 1 tablet (137 mcg total) by mouth every morning. For thyroid hormone replacement 03/08/12  Yes Encarnacion Slates, NP  lisinopril (PRINIVIL,ZESTRIL) 20 MG tablet Take 1 tablet (20 mg total) by mouth daily. 02/27/14  Yes Rosita Fire, MD  NEXIUM 40 MG capsule Take 40 mg by mouth every morning.  05/13/14  Yes Historical Provider, MD  polyethylene glycol powder (GLYCOLAX/MIRALAX) powder Take 17 g by mouth daily as needed for mild constipation.  02/03/14  Yes Historical Provider, MD  promethazine (PHENERGAN) 25 MG tablet Take 25 mg by mouth 3 (three) times daily as needed. For nausea and/or vimiting 02/03/14  Yes Historical Provider, MD  traMADol (ULTRAM) 50 MG tablet Take 50 mg by mouth 2 (two) times daily as needed.  02/03/14  Yes Historical Provider, MD  doxycycline (VIBRAMYCIN) 100 MG capsule Take 1 capsule (100 mg total) by mouth 2 (two) times daily. Patient not taking: Reported on 01/27/2015 08/14/14   Fredia Sorrow, MD  HYDROcodone-acetaminophen (NORCO/VICODIN) 5-325 MG per tablet  Take 1-2 tablets by mouth every 6 (six) hours as needed. Patient not taking: Reported on 10/10/2014 08/14/14   Fredia Sorrow, MD  metroNIDAZOLE (FLAGYL) 500 MG tablet Take 1 tablet (500 mg total) by mouth 2 (two) times daily. Patient not taking: Reported on 10/10/2014 06/13/14   Florian Buff, MD  naproxen (NAPROSYN) 500 MG tablet Take 1 tablet (500 mg total) by mouth 2 (two) times daily. Patient not taking: Reported on 01/27/2015 05/29/14   Dorie Rank, MD  ondansetron (ZOFRAN ODT) 4 MG disintegrating tablet Take 1 tablet (4 mg total) by mouth every 8 (eight) hours as needed. Patient not taking: Reported on 10/10/2014 08/14/14   Fredia Sorrow, MD  oxyCODONE-acetaminophen (PERCOCET) 5-325 MG per tablet Take 1 tablet by mouth every 4 (four) hours as needed  for moderate pain. Patient not taking: Reported on 01/27/2015 58/85/02   Delora Fuel, MD  oxyCODONE-acetaminophen (PERCOCET) 5-325 MG per tablet Take 1 tablet by mouth every 6 (six) hours as needed for moderate pain. Patient not taking: Reported on 01/27/2015 09/07/14   Carole Civil, MD  oxyCODONE-acetaminophen (PERCOCET/ROXICET) 5-325 MG per tablet Take 1 tablet by mouth every 8 (eight) hours as needed for severe pain. Patient not taking: Reported on 01/27/2015 10/10/14   Carole Civil, MD  traZODone (DESYREL) 50 MG tablet Take 1 tablet (50 mg total) by mouth at bedtime. Patient not taking: Reported on 01/27/2015 02/27/14   Rosita Fire, MD   Triage Vitals: BP 131/74 mmHg  Pulse 96  Temp(Src) 98.1 F (36.7 C) (Oral)  Resp 20  Ht 5\' 5"  (1.651 m)  Wt 160 lb (72.576 kg)  BMI 26.63 kg/m2  SpO2 97%   Physical Exam  Constitutional: She is oriented to person, place, and time. She appears well-developed.  HENT:  Head: Normocephalic.  Eyes: Conjunctivae are normal.  Neck: No tracheal deviation present.  Cardiovascular:  No murmur heard. Musculoskeletal: Normal range of motion.  Right ankle mildly swollen laterally and tender Left knee  swollen laterally.   Neurological: She is oriented to person, place, and time.  Skin: Skin is warm.  Psychiatric: She has a normal mood and affect.    ED Course  Procedures (including critical care time)  DIAGNOSTIC STUDIES: Oxygen Saturation is 97% on RA, normal by my interpretation.    COORDINATION OF CARE: 8:59 PM-Discussed treatment plan which includes DG R Ankle and DG L Knee with pt at bedside and pt agreed to plan.   Labs Review Labs Reviewed - No data to display  Imaging Review Dg Ankle Complete Right  01/27/2015   CLINICAL DATA:  Fall downstairs with right ankle pain, initial encounter  EXAM: RIGHT ANKLE - COMPLETE 3+ VIEW  COMPARISON:  10/10/2014  FINDINGS: There again noted changes consistent with a prior fracture at the tip of the fibula. This may represent reactivation of the same injury. No other fractures are noted. Mild tarsal degenerative changes are seen. Lateral soft tissue swelling is noted.  IMPRESSION: Fracture at the tip of the fibula which may represent reactivation of a previous injury seen on 10/10/2014.   Electronically Signed   By: Inez Catalina M.D.   On: 01/27/2015 21:58   Dg Knee Complete 4 Views Left  01/27/2015   CLINICAL DATA:  Missed step and fell today at home injuring LEFT knee and RIGHT ankle, history diabetes, COPD  EXAM: LEFT KNEE - COMPLETE 4+ VIEW  COMPARISON:  09/04/2014  FINDINGS: Mild osseous demineralization.  Joint spaces preserved.  Subtle nondisplaced fracture of the lateral tibial plateau.  No additional fracture dislocation identified.  Question tiny fat fluid level within the suprapatellar recess.  Soft tissues otherwise unremarkable.  IMPRESSION: Subtle nondisplaced lateral tibial plateau fracture LEFT knee with suspected lipohemarthrosis.   Electronically Signed   By: Lavonia Dana M.D.   On: 01/27/2015 22:00     EKG Interpretation None      MDM   Final diagnoses:  None   Ankle and knee fx.  Spoke with Dr. Erlinda Hong and he will follow pt  up as out pt   The chart was scribed for me under my direct supervision.  I personally performed the history, physical, and medical decision making and all procedures in the evaluation of this patient.Milton Ferguson, MD 01/29/15 [id number]

## 2015-01-27 NOTE — ED Notes (Signed)
Pt missed step fell, injuring left knee and right ankle, denies hitting head.

## 2015-01-28 DIAGNOSIS — S92001A Unspecified fracture of right calcaneus, initial encounter for closed fracture: Secondary | ICD-10-CM | POA: Diagnosis not present

## 2015-01-28 MED ORDER — OXYCODONE-ACETAMINOPHEN 5-325 MG PO TABS: 1.0000 | ORAL_TABLET | Freq: Four times a day (QID) | ORAL | Status: DC | PRN

## 2015-01-28 NOTE — Discharge Instructions (Signed)
Follow up with Dr. Erlinda Hong or Dr. Aline Brochure next week.  Use a walker and do not stand on left leg.  No weight to be put on left leg

## 2015-01-28 NOTE — ED Notes (Signed)
Pt was given a pre-package of Percocet quantity six, pt given instructions on use, patient verbally understands.

## 2015-01-29 ENCOUNTER — Telehealth: Payer: Self-pay | Admitting: Orthopedic Surgery

## 2015-01-29 NOTE — Telephone Encounter (Signed)
Patient called to relay that she has been seen at [facility] 01/27/15 for 2 new injuries (patient was last seen here for foot fracture 10/10/14, and missed her most recent follow up appointment 11/20/14).  States she has a right ankle fracture and a left knee fracture.  States had been [place] and had CT scan done at [facility].  Patient has contacted her primary care, Dr Legrand Rams, and the referral is in processing.  Please review films and reports due to the extent of injuries, for advice regarding scheduling.

## 2015-01-30 MED FILL — Oxycodone w/ Acetaminophen Tab 5-325 MG: ORAL | Qty: 6 | Status: AC

## 2015-01-30 NOTE — Telephone Encounter (Signed)
i can do wed or thurs next week

## 2015-01-30 NOTE — Telephone Encounter (Signed)
Relayed to patient.  Appointment being scheduled.  Referral has been received. Patient aware.

## 2015-01-31 NOTE — Telephone Encounter (Signed)
Called back to patient; appointment scheduled appointment accordingly.

## 2015-02-02 ENCOUNTER — Encounter (HOSPITAL_COMMUNITY): Payer: Self-pay | Admitting: *Deleted

## 2015-02-02 ENCOUNTER — Emergency Department (HOSPITAL_COMMUNITY)
Admission: EM | Admit: 2015-02-02 | Discharge: 2015-02-02 | Disposition: A | Discharge status: Home / Self Care | Payer: Medicare Other | Attending: Emergency Medicine | Admitting: Emergency Medicine

## 2015-02-02 DIAGNOSIS — E039 Hypothyroidism, unspecified: Secondary | ICD-10-CM | POA: Diagnosis not present

## 2015-02-02 DIAGNOSIS — Z8673 Personal history of transient ischemic attack (TIA), and cerebral infarction without residual deficits: Secondary | ICD-10-CM | POA: Diagnosis not present

## 2015-02-02 DIAGNOSIS — I10 Essential (primary) hypertension: Secondary | ICD-10-CM | POA: Diagnosis not present

## 2015-02-02 DIAGNOSIS — J449 Chronic obstructive pulmonary disease, unspecified: Secondary | ICD-10-CM | POA: Insufficient documentation

## 2015-02-02 DIAGNOSIS — Z8719 Personal history of other diseases of the digestive system: Secondary | ICD-10-CM | POA: Diagnosis not present

## 2015-02-02 DIAGNOSIS — E1142 Type 2 diabetes mellitus with diabetic polyneuropathy: Secondary | ICD-10-CM | POA: Diagnosis not present

## 2015-02-02 DIAGNOSIS — S82109S Unspecified fracture of upper end of unspecified tibia, sequela: Secondary | ICD-10-CM | POA: Diagnosis not present

## 2015-02-02 DIAGNOSIS — Z79899 Other long term (current) drug therapy: Secondary | ICD-10-CM | POA: Diagnosis not present

## 2015-02-02 DIAGNOSIS — Z794 Long term (current) use of insulin: Secondary | ICD-10-CM | POA: Insufficient documentation

## 2015-02-02 DIAGNOSIS — S82142S Displaced bicondylar fracture of left tibia, sequela: Secondary | ICD-10-CM | POA: Diagnosis not present

## 2015-02-02 DIAGNOSIS — R52 Pain, unspecified: Secondary | ICD-10-CM

## 2015-02-02 DIAGNOSIS — F419 Anxiety disorder, unspecified: Secondary | ICD-10-CM | POA: Insufficient documentation

## 2015-02-02 DIAGNOSIS — Z8711 Personal history of peptic ulcer disease: Secondary | ICD-10-CM | POA: Diagnosis not present

## 2015-02-02 DIAGNOSIS — G8929 Other chronic pain: Secondary | ICD-10-CM | POA: Insufficient documentation

## 2015-02-02 DIAGNOSIS — W1839XS Other fall on same level, sequela: Secondary | ICD-10-CM | POA: Insufficient documentation

## 2015-02-02 DIAGNOSIS — S8982XS Other specified injuries of left lower leg, sequela: Secondary | ICD-10-CM | POA: Diagnosis present

## 2015-02-02 HISTORY — DX: Essential (primary) hypertension: I10

## 2015-02-02 MED ORDER — OXYCODONE-ACETAMINOPHEN 5-325 MG PO TABS
1.0000 | ORAL_TABLET | Freq: Once | ORAL | Status: AC
  Administered 2015-02-02: 1 via ORAL
  Filled 2015-02-02: qty 1

## 2015-02-02 MED ORDER — OXYCODONE-ACETAMINOPHEN 5-325 MG PO TABS: 1.0000 | ORAL_TABLET | Freq: Four times a day (QID) | ORAL | Status: DC | PRN

## 2015-02-02 NOTE — ED Provider Notes (Signed)
CSN: [id number]     Arrival date & time 02/02/15  2136 History  This chart was scribed for Merryl Hacker, MD by Marlowe Kays, ED Scribe. This patient was seen in room APA10/APA10 and the patient's care was started at 11:20 PM.  Chief Complaint  Patient presents with  . Knee Pain   HPI  HPI Comments:  [name] is a 54 y.o. female who presents to the Emergency Department complaining of severe left leg pain and right ankle pain secondary to a fracture that occurred about 6 days ago. She rates her pain at 9/10. She reports she has ran out of the medication she was prescribed at the time of the fall. Pt states she has tried to get an appt with Dr. Aline Brochure in five days and could not be seen any sooner. Denies modifying factors. Denies new trauma, injury or fall. Denies fever, chills, nausea, vomiting, numbness, tingling or weakness of the lower extremities. PMHx of hypothyroid, anxiety, COPD, GERD, PUD, hiatal herniea, DM, HLD, CVA, HTN and polyneuropathy.   Past Medical History  Diagnosis Date  . Hypothyroid   . Anxiety   . COPD (chronic obstructive pulmonary disease)   . GERD (gastroesophageal reflux disease)   . PUD (peptic ulcer disease)     ? remote past, no reports from [place] or [facility], states possibly Dr. Laural Golden.   . Hiatal hernia   . Chronic abdominal pain   . Diabetes mellitus     type I  . Hyperlipidemia   . Stroke   . Polyneuropathy in diabetes   . Unspecified polyarthropathy or polyarthritis, site unspecified   . Hypertension    Past Surgical History  Procedure Laterality Date  . Abdominal hysterectomy    . Tonsillectomy    . Appendectomy    . Bladder repair    . Esophagogastroduodenoscopy      in remote past, unclear who performed. No op notes from [facility] or [facility] (pt thought Dr. Laural Golden)  . Esophagogastroduodenoscopy  02/12/2012    Dr. Gala Romney:  Normal esophagus status post 56 F dilation. Small hiatal hernia. mild chronic gastritis  .  Esophagogastroduodenoscopy N/A 02/21/2014    BJS:EGBTDV exudative reflux esophagitis-rule out Candida esophagitis.   Family History  Problem Relation Age of Onset  . Liver cancer Mother     remission for 2 years  . Colon cancer Neg Hx    History  Substance Use Topics  . Smoking status: Current Every Day Smoker -- 0.50 packs/day for 15 years    Types: Cigarettes  . Smokeless tobacco: Not on file  . Alcohol Use: No   OB History    No data available     Review of Systems  Constitutional: Negative for fever.  Musculoskeletal:       Leg pain  Skin: Negative for color change and wound.  All other systems reviewed and are negative.   Allergies  Phenytoin; Sulfonamide derivatives; and Tetracyclines & related  Home Medications   Prior to Admission medications   Medication Sig Start Date End Date Taking? Authorizing Provider  albuterol (PROVENTIL HFA;VENTOLIN HFA) 108 (90 BASE) MCG/ACT inhaler Inhale 2 puffs into the lungs every 6 (six) hours as needed. For shortness of breath 03/08/12  Yes Encarnacion Slates, NP  DULoxetine (CYMBALTA) 20 MG capsule Take 20 mg by mouth every evening.    Yes Historical Provider, MD  escitalopram (LEXAPRO) 10 MG tablet Take 10 mg by mouth at bedtime.    Yes Historical Provider, MD  fluticasone (FLONASE) 50 MCG/ACT nasal spray Place 2 sprays into both nostrils daily as needed for allergies or rhinitis.   Yes Historical Provider, MD  gabapentin (NEURONTIN) 100 MG capsule Take 200 mg by mouth 4 (four) times daily.    Yes Historical Provider, MD  Insulin Glargine (LANTUS SOLOSTAR) 100 UNIT/ML Solostar Pen Inject 16 Units into the skin at bedtime.   Yes Historical Provider, MD  insulin lispro (HUMALOG) 100 UNIT/ML injection Inject 5-10 Units into the skin 3 (three) times daily as needed for high blood sugar (Uses 3 timesdaily according to sliding scale).   Yes Historical Provider, MD  levothyroxine (SYNTHROID, LEVOTHROID) 137 MCG tablet Take 1 tablet (137 mcg total)  by mouth every morning. For thyroid hormone replacement 03/08/12  Yes Encarnacion Slates, NP  lisinopril (PRINIVIL,ZESTRIL) 20 MG tablet Take 1 tablet (20 mg total) by mouth daily. 02/27/14  Yes Rosita Fire, MD  NEXIUM 40 MG capsule Take 40 mg by mouth every morning.  05/13/14  Yes Historical Provider, MD  oxyCODONE-acetaminophen (PERCOCET/ROXICET) 5-325 MG per tablet Take 1 tablet by mouth every 6 (six) hours as needed. 01/28/15  Yes Milton Ferguson, MD  polyethylene glycol powder (GLYCOLAX/MIRALAX) powder Take 17 g by mouth daily as needed for mild constipation.  02/03/14  Yes Historical Provider, MD  promethazine (PHENERGAN) 25 MG tablet Take 25 mg by mouth 3 (three) times daily as needed. For nausea and/or vimiting 02/03/14  Yes Historical Provider, MD  traMADol (ULTRAM) 50 MG tablet Take 50 mg by mouth 2 (two) times daily as needed for moderate pain.  02/03/14  Yes Historical Provider, MD  doxycycline (VIBRAMYCIN) 100 MG capsule Take 1 capsule (100 mg total) by mouth 2 (two) times daily. Patient not taking: Reported on 01/27/2015 08/14/14   Fredia Sorrow, MD  HYDROcodone-acetaminophen (NORCO/VICODIN) 5-325 MG per tablet Take 1-2 tablets by mouth every 6 (six) hours as needed. Patient not taking: Reported on 10/10/2014 08/14/14   Fredia Sorrow, MD  metroNIDAZOLE (FLAGYL) 500 MG tablet Take 1 tablet (500 mg total) by mouth 2 (two) times daily. Patient not taking: Reported on 10/10/2014 06/13/14   Florian Buff, MD  ondansetron (ZOFRAN ODT) 4 MG disintegrating tablet Take 1 tablet (4 mg total) by mouth every 8 (eight) hours as needed. Patient not taking: Reported on 10/10/2014 08/14/14   Fredia Sorrow, MD  oxyCODONE-acetaminophen (PERCOCET/ROXICET) 5-325 MG per tablet Take 1 tablet by mouth every 6 (six) hours as needed for severe pain. 02/02/15   Merryl Hacker, MD  traZODone (DESYREL) 50 MG tablet Take 1 tablet (50 mg total) by mouth at bedtime. Patient not taking: Reported on 01/27/2015 02/27/14   Rosita Fire, MD   Triage Vitals: BP 146/58 mmHg  Pulse 94  Temp(Src) 98.3 F (36.8 C) (Oral)  Resp 22  SpO2 100% Physical Exam  Constitutional: She is oriented to person, place, and time. She appears well-developed and well-nourished. No distress.  HENT:  Head: Normocephalic and atraumatic.  Cardiovascular: Normal rate, regular rhythm and normal heart sounds.   Pulmonary/Chest: Effort normal. No respiratory distress. She has wheezes.  Musculoskeletal:  Knee immobilizer on left lower extremity, walking boot on right lower extremity  Neurological: She is alert and oriented to person, place, and time.  Skin: Skin is warm and dry.  Psychiatric: She has a normal mood and affect.  Nursing note and vitals reviewed.   ED Course  Procedures (including critical care time) DIAGNOSTIC STUDIES: Oxygen Saturation is 100% on RA, normal by my interpretation.  COORDINATION OF CARE: 11:23 PM- Will prescribe pain medication until pt can be seen by Dr. Aline Brochure. Pt verbalizes understanding and agrees to plan.  Medications  oxyCODONE-acetaminophen (PERCOCET/ROXICET) 5-325 MG per tablet 1 tablet (not administered)    Labs Review Labs Reviewed - No data to display  Imaging Review No results found.   EKG Interpretation None      MDM   Final diagnoses:  Pain  Tibial plateau fracture, left, sequela   Patient presents requesting pain medication refill. Was seen several days ago and found to have a left tibial plateau fracture and a right distal fibula fracture. Cannot see Dr. Aline Brochure until later this week. Denies any new injury. States that she has been nonweightbearing on the left lower extremity as instructed. Patient will be refilled a short course of her pain medication given the acute nature of her injuries. However, if the patient requires further refills, she was instructed that she will need to follow-up with her primary physician.  After history, exam, and medical workup I feel the  patient has been appropriately medically screened and is safe for discharge home. Pertinent diagnoses were discussed with the patient. Patient was given return precautions.   I personally performed the services described in this documentation, which was scribed in my presence. The recorded information has been reviewed and is accurate.    Merryl Hacker, MD 02/02/15 [id number]

## 2015-02-02 NOTE — Discharge Instructions (Signed)
You will be given a short course of pain medication. He need to follow-up with Dr. Aline Brochure as scheduled. If you require more pain medication prior to follow-up, you need to contact her primary care physician. Continue to be nonweightbearing on left lower extremity.

## 2015-02-02 NOTE — ED Notes (Signed)
   02/02/15 2325  Musculoskeletal  Musculoskeletal (WDL) X  LLE Ortho/Supportive Device  left knee has Knee immobilizer in place

## 2015-02-02 NOTE — ED Notes (Signed)
Seen here 5/7 after fall . Has KI on lt leg and  Boot on rt leg. Has been unable to see Dr Aline Brochure and needs pain med.

## 2015-02-02 NOTE — ED Notes (Signed)
Pt alert & oriented x4, stable gait. Patient given discharge instructions, paperwork & prescription(s). Patient informed not to drive, operate any equipment & handel any important documents 4 hours after taking pain medication. Patient  instructed to stop at the registration desk to finish any additional paperwork. Patient  verbalized understanding. Pt left department in wheelchair w/ no further questions. 

## 2015-02-02 NOTE — ED Notes (Signed)
Pt states she is needing pain medication. She is out of the scripts she got here & has been unable to see Dr Jarvis Morgan.

## 2015-02-07 ENCOUNTER — Ambulatory Visit (INDEPENDENT_AMBULATORY_CARE_PROVIDER_SITE_OTHER): Payer: Medicare Other | Admitting: Orthopedic Surgery

## 2015-02-07 ENCOUNTER — Encounter: Payer: Self-pay | Admitting: Orthopedic Surgery

## 2015-02-07 VITALS — BP 99/73 | Ht 65.0 in | Wt 160.0 lb

## 2015-02-07 DIAGNOSIS — S82401A Unspecified fracture of shaft of right fibula, initial encounter for closed fracture: Secondary | ICD-10-CM

## 2015-02-07 DIAGNOSIS — S82142A Displaced bicondylar fracture of left tibia, initial encounter for closed fracture: Secondary | ICD-10-CM | POA: Diagnosis not present

## 2015-02-07 MED ORDER — OXYCODONE-ACETAMINOPHEN 5-325 MG PO TABS: 1.0000 | ORAL_TABLET | ORAL | Status: DC | PRN

## 2015-02-07 NOTE — Progress Notes (Signed)
[name], female   DOB: 10-20-1960, 54 y.o.   MRN: [id number] Chief Complaint  Patient presents with  . Fracture    ER follow up, Rt knee and Lt ankle fractures, DOI 01/27/15 fall    [name], female   DOB: 03-27-1961, 54 y.o.   MRN: [id number]  Chief Complaint  Patient presents with  . Fracture    ER follow up, Rt knee and Lt ankle fractures, DOI 01/27/15 fall     [name] is a 54 y.o. female.   HPI 54 year old female has a sunken living room she stepped into the room and tripped and fell she fractured her left tibial plateau fracture right ankle. She presents in a Cam Walker on the right and a knee immobilizer on the left.  Symptoms pain swelling. Pain described as dull throbbing stabbing aching. Pain intensity of 10 on Percocet 5. She's had x-rays and CT scan. Her symptoms are worse with walking. Review of Systems Her review of systems is notable for night sweats and fatigue vision problems tendinitis nausea constipation depression back pain lightheadedness numbness tingling and dizziness  Past Medical History  Diagnosis Date  . Hypothyroid   . Anxiety   . COPD (chronic obstructive pulmonary disease)   . GERD (gastroesophageal reflux disease)   . PUD (peptic ulcer disease)     ? remote past, no reports from [facility] or [facility], states possibly Dr. Laural Golden.   . Hiatal hernia   . Chronic abdominal pain   . Diabetes mellitus     type I  . Hyperlipidemia   . Stroke   . Polyneuropathy in diabetes   . Unspecified polyarthropathy or polyarthritis, site unspecified   . Hypertension     Past Surgical History  Procedure Laterality Date  . Abdominal hysterectomy    . Tonsillectomy    . Appendectomy    . Bladder repair    . Esophagogastroduodenoscopy      in remote past, unclear who performed. No op notes from [facility] or [facility] (pt thought Dr. Laural Golden)  . Esophagogastroduodenoscopy  02/12/2012    Dr. Gala Romney:  Normal esophagus status post 33  F dilation. Small hiatal hernia. mild chronic gastritis  . Esophagogastroduodenoscopy N/A 02/21/2014    VHQ:IONGEX exudative reflux esophagitis-rule out Candida esophagitis.    Family History  Problem Relation Age of Onset  . Liver cancer Mother     remission for 2 years  . Colon cancer Neg Hx     Social History History  Substance Use Topics  . Smoking status: Current Every Day Smoker -- 0.50 packs/day for 15 years    Types: Cigarettes  . Smokeless tobacco: Not on file  . Alcohol Use: No    Allergies  Allergen Reactions  . Phenytoin Anaphylaxis    Reaction: SJS ([facility]EVENS JOHNSON SYNDROME)  . Sulfonamide Derivatives Nausea And Vomiting  . Tetracyclines & Related Nausea And Vomiting    Current Outpatient Prescriptions  Medication Sig Dispense Refill  . albuterol (PROVENTIL HFA;VENTOLIN HFA) 108 (90 BASE) MCG/ACT inhaler Inhale 2 puffs into the lungs every 6 (six) hours as needed. For shortness of breath    . DULoxetine (CYMBALTA) 20 MG capsule Take 20 mg by mouth every evening.     . escitalopram (LEXAPRO) 10 MG tablet Take 10 mg by mouth at bedtime.     . fluticasone (FLONASE) 50 MCG/ACT nasal spray Place 2 sprays into both nostrils daily as needed for allergies or rhinitis.    Marland Kitchen  gabapentin (NEURONTIN) 100 MG capsule Take 200 mg by mouth 4 (four) times daily.     . Insulin Glargine (LANTUS SOLOSTAR) 100 UNIT/ML Solostar Pen Inject 16 Units into the skin at bedtime.    . insulin lispro (HUMALOG) 100 UNIT/ML injection Inject 5-10 Units into the skin 3 (three) times daily as needed for high blood sugar (Uses 3 timesdaily according to sliding scale).    Marland Kitchen levothyroxine (SYNTHROID, LEVOTHROID) 137 MCG tablet Take 1 tablet (137 mcg total) by mouth every morning. For thyroid hormone replacement    . lisinopril (PRINIVIL,ZESTRIL) 20 MG tablet Take 1 tablet (20 mg total) by mouth daily. 30 tablet 0  . NEXIUM 40 MG capsule Take 40 mg by mouth every morning.     Marland Kitchen oxyCODONE-acetaminophen  (PERCOCET/ROXICET) 5-325 MG per tablet Take 1 tablet by mouth every 4 (four) hours as needed for severe pain. 84 tablet 0  . polyethylene glycol powder (GLYCOLAX/MIRALAX) powder Take 17 g by mouth daily as needed for mild constipation.     . promethazine (PHENERGAN) 25 MG tablet Take 25 mg by mouth 3 (three) times daily as needed. For nausea and/or vimiting    . traMADol (ULTRAM) 50 MG tablet Take 50 mg by mouth 2 (two) times daily as needed for moderate pain.      No current facility-administered medications for this visit.       Physical Exam Blood pressure 99/73, height 5\' 5"  (1.651 m), weight 160 lb (72.576 kg). Physical Exam The patient is well developed well nourished and well groomed. Orientation to person place and time is normal  Mood is pleasant. Ambulatory status amazingly she can ambulate with a walker and her Cam Walker on the right Starting with the right ankle she has tenderness at the tip of the fibula there is mild swelling range of motion stability and strength are normal skin is intact to sensation and vascularity are normal  Left knee left knee has very little flexion 20. She has tenderness lateral joint line no effusion is stable as could be tested. Motor exam is normal scans intact neurovascular exam is normal  Data Reviewed I reviewed her CT scan her plain films knee and ankle  I see a nondisplaced minimally depressed tibial Plateau fracture on the left and a distal fibular Weber a fracture on the right  Assessment Encounter Diagnoses  Name Primary?  . Tibial plateau fracture, left, closed, initial encounter Yes  . Right fibular fracture, closed, initial encounter      Meds ordered this encounter  Medications  . oxyCODONE-acetaminophen (PERCOCET/ROXICET) 5-325 MG per tablet    Sig: Take 1 tablet by mouth every 4 (four) hours as needed for severe pain.    Dispense:  84 tablet    Refill:  0    Plan I placed her in a T score brace full range of motion  weight bear as tolerated, she is also in a Cam Walker but it doesn't fit gave her a smaller version weight-bear as tolerated  X-rays both areas in 4 weeks

## 2015-02-27 ENCOUNTER — Ambulatory Visit (INDEPENDENT_AMBULATORY_CARE_PROVIDER_SITE_OTHER): Payer: Self-pay | Admitting: Orthopedic Surgery

## 2015-02-27 ENCOUNTER — Ambulatory Visit (INDEPENDENT_AMBULATORY_CARE_PROVIDER_SITE_OTHER): Payer: Medicare Other

## 2015-02-27 VITALS — BP 143/77 | Ht 65.0 in | Wt 160.0 lb

## 2015-02-27 DIAGNOSIS — S82142D Displaced bicondylar fracture of left tibia, subsequent encounter for closed fracture with routine healing: Secondary | ICD-10-CM

## 2015-02-27 DIAGNOSIS — S82891A Other fracture of right lower leg, initial encounter for closed fracture: Secondary | ICD-10-CM

## 2015-02-27 MED ORDER — OXYCODONE-ACETAMINOPHEN 5-325 MG PO TABS: 1.0000 | ORAL_TABLET | ORAL | Status: DC | PRN

## 2015-02-27 NOTE — Patient Instructions (Signed)
Okay to remove boot Weight bearing as tolerated in knee brace

## 2015-02-27 NOTE — Progress Notes (Signed)
[name], female   DOB: 03/31/1961, 54 y.o.   MRN: [id number] Chief Complaint  Patient presents with  . Follow-up    3 week recheck on right ankle fracture and left tibial plateau fracture with xrays.    The patient injured her left knee and right ankle on 01/27/2015 this is day #31  Showed a left tibial plateau fracture right transverse Weber a distal fibular fracture  She says her ankles doing well her left knee is still hurting. She was treated with a Cam Walker on the right in a hinged knee brace on the left  She has tenderness over the lateral compartment but her knee remained stable  X-ray show both fractures healing well  Continue bracing on the left remove brace on the right  Percocet 5 mg every 4 when necessary pain #84  We will stretch this out over the next description to every 6 hours and then every 8 hours and then 30 for the month and then if still ongoing medication needs will switch to either an anti-inflammatory or less potent opiate.  4 clarity sake she is weightbearing as tolerated

## 2015-03-27 ENCOUNTER — Encounter: Payer: Self-pay | Admitting: Orthopedic Surgery

## 2015-03-27 ENCOUNTER — Ambulatory Visit (INDEPENDENT_AMBULATORY_CARE_PROVIDER_SITE_OTHER): Payer: Medicare Other

## 2015-03-27 ENCOUNTER — Ambulatory Visit (INDEPENDENT_AMBULATORY_CARE_PROVIDER_SITE_OTHER): Payer: Medicare Other | Admitting: Orthopedic Surgery

## 2015-03-27 VITALS — BP 140/81 | Ht 65.0 in | Wt 160.0 lb

## 2015-03-27 DIAGNOSIS — S82142D Displaced bicondylar fracture of left tibia, subsequent encounter for closed fracture with routine healing: Secondary | ICD-10-CM | POA: Diagnosis not present

## 2015-03-27 DIAGNOSIS — M7551 Bursitis of right shoulder: Secondary | ICD-10-CM

## 2015-03-27 MED ORDER — OXYCODONE-ACETAMINOPHEN 5-325 MG PO TABS: 1.0000 | ORAL_TABLET | Freq: Four times a day (QID) | ORAL | Status: DC | PRN

## 2015-03-27 NOTE — Patient Instructions (Signed)
CALL [facility] THERAPY DEPT TO SCHEDULE THERAPY 

## 2015-03-27 NOTE — Progress Notes (Signed)
Patient ID: Mandy ACHEY, female   DOB: August 06, 1961, 54 y.o.   MRN: [id number] Chief Complaint  Patient presents with  . Follow-up    4 week recheck on left knee fracture with xray. Injury date May 7    She is now several weeks post tibial plateau fracture and right ankle injury which has subsequently healed. We are still treating the left knee plateau fracture with a hinged knee brace. Today's x-ray shows fractures in good position  She is complaining of right shoulder pain and loss of motion with no trauma  Review of systems related to the shoulder negative  She has. Acromial tenderness decreased range of motion in abduction external rotation and forward elevation on the left arm moves normal  No instability neurovascular exam is intact skin looks fine no rash  Bursitis right shoulder  Tibial plateau fracture  Follow-up 6 weeks. PT for 4 weeks. Remove brace. I injected right shoulder.   Procedure note the subacromial injection shoulder RIGHT  Verbal consent was obtained to inject the  RIGHT   Shoulder  Timeout was completed to confirm the injection site is a subacromial space of the  RIGHT  shoulder   Medication used Depo-Medrol 40 mg and lidocaine 1% 3 cc  Anesthesia was provided by ethyl chloride  The injection was performed in the RIGHT  posterior subacromial space. After pinning the skin with alcohol and anesthetized the skin with ethyl chloride the subacromial space was injected using a 20-gauge needle. There were no complications  Sterile dressing was applied.

## 2015-04-05 DIAGNOSIS — M25512 Pain in left shoulder: Secondary | ICD-10-CM | POA: Diagnosis not present

## 2015-04-05 DIAGNOSIS — M542 Cervicalgia: Secondary | ICD-10-CM | POA: Diagnosis not present

## 2015-04-05 DIAGNOSIS — M25561 Pain in right knee: Secondary | ICD-10-CM | POA: Diagnosis not present

## 2015-04-05 DIAGNOSIS — M25511 Pain in right shoulder: Secondary | ICD-10-CM | POA: Diagnosis not present

## 2015-04-05 DIAGNOSIS — M545 Low back pain: Secondary | ICD-10-CM | POA: Diagnosis not present

## 2015-04-05 DIAGNOSIS — M79642 Pain in left hand: Secondary | ICD-10-CM | POA: Diagnosis not present

## 2015-04-05 DIAGNOSIS — M79641 Pain in right hand: Secondary | ICD-10-CM | POA: Diagnosis not present

## 2015-04-05 DIAGNOSIS — M25562 Pain in left knee: Secondary | ICD-10-CM | POA: Diagnosis not present

## 2015-04-12 ENCOUNTER — Encounter (HOSPITAL_COMMUNITY): Payer: Self-pay | Admitting: Emergency Medicine

## 2015-04-12 ENCOUNTER — Emergency Department (HOSPITAL_COMMUNITY)
Admission: EM | Admit: 2015-04-12 | Discharge: 2015-04-12 | Disposition: A | Discharge status: Home / Self Care | Payer: Medicare Other | Attending: Emergency Medicine | Admitting: Emergency Medicine

## 2015-04-12 ENCOUNTER — Ambulatory Visit (HOSPITAL_COMMUNITY): Payer: Medicare Other

## 2015-04-12 DIAGNOSIS — E1065 Type 1 diabetes mellitus with hyperglycemia: Secondary | ICD-10-CM | POA: Insufficient documentation

## 2015-04-12 DIAGNOSIS — Z79899 Other long term (current) drug therapy: Secondary | ICD-10-CM | POA: Diagnosis not present

## 2015-04-12 DIAGNOSIS — L03313 Cellulitis of chest wall: Secondary | ICD-10-CM | POA: Diagnosis not present

## 2015-04-12 DIAGNOSIS — I10 Essential (primary) hypertension: Secondary | ICD-10-CM | POA: Diagnosis not present

## 2015-04-12 DIAGNOSIS — F419 Anxiety disorder, unspecified: Secondary | ICD-10-CM | POA: Insufficient documentation

## 2015-04-12 DIAGNOSIS — J449 Chronic obstructive pulmonary disease, unspecified: Secondary | ICD-10-CM | POA: Insufficient documentation

## 2015-04-12 DIAGNOSIS — N61 Inflammatory disorders of breast: Secondary | ICD-10-CM | POA: Diagnosis not present

## 2015-04-12 DIAGNOSIS — R739 Hyperglycemia, unspecified: Secondary | ICD-10-CM

## 2015-04-12 DIAGNOSIS — E039 Hypothyroidism, unspecified: Secondary | ICD-10-CM | POA: Diagnosis not present

## 2015-04-12 DIAGNOSIS — R509 Fever, unspecified: Secondary | ICD-10-CM | POA: Diagnosis present

## 2015-04-12 DIAGNOSIS — Z794 Long term (current) use of insulin: Secondary | ICD-10-CM | POA: Insufficient documentation

## 2015-04-12 DIAGNOSIS — R21 Rash and other nonspecific skin eruption: Secondary | ICD-10-CM | POA: Diagnosis not present

## 2015-04-12 DIAGNOSIS — G8929 Other chronic pain: Secondary | ICD-10-CM | POA: Diagnosis not present

## 2015-04-12 DIAGNOSIS — E1049 Type 1 diabetes mellitus with other diabetic neurological complication: Secondary | ICD-10-CM | POA: Insufficient documentation

## 2015-04-12 DIAGNOSIS — K219 Gastro-esophageal reflux disease without esophagitis: Secondary | ICD-10-CM | POA: Diagnosis not present

## 2015-04-12 DIAGNOSIS — Z8673 Personal history of transient ischemic attack (TIA), and cerebral infarction without residual deficits: Secondary | ICD-10-CM | POA: Insufficient documentation

## 2015-04-12 DIAGNOSIS — Z8711 Personal history of peptic ulcer disease: Secondary | ICD-10-CM | POA: Diagnosis not present

## 2015-04-12 DIAGNOSIS — E1165 Type 2 diabetes mellitus with hyperglycemia: Secondary | ICD-10-CM | POA: Diagnosis not present

## 2015-04-12 LAB — BASIC METABOLIC PANEL
Anion gap: 9 (ref 5–15)
BUN: 19 mg/dL (ref 6–20)
CHLORIDE: 99 mmol/L — AB (ref 101–111)
CO2: 25 mmol/L (ref 22–32)
Calcium: 9.2 mg/dL (ref 8.9–10.3)
Creatinine, Ser: 0.87 mg/dL (ref 0.44–1.00)
GFR calc Af Amer: >60 mL/min (ref >60)
GFR calc non Af Amer: >60 mL/min (ref >60)
Glucose, Bld: 404 mg/dL — ABNORMAL HIGH (ref 65–99)
Potassium: 4.3 mmol/L (ref 3.5–5.1)
Sodium: 133 mmol/L — ABNORMAL LOW (ref 135–145)

## 2015-04-12 LAB — CBC WITH DIFFERENTIAL/PLATELET
BASOS ABS: 0 10*3/uL (ref 0.0–0.1)
Basophils Relative: 1 % (ref 0–1)
EOS ABS: 0.3 10*3/uL (ref 0.0–0.7)
Eosinophils Relative: 5 % (ref 0–5)
HCT: 40.5 % (ref 36.0–46.0)
Hemoglobin: 13.5 g/dL (ref 12.0–15.0)
LYMPHS ABS: 2.1 10*3/uL (ref 0.7–4.0)
LYMPHS PCT: 29 % (ref 12–46)
MCH: 31 pg (ref 26.0–34.0)
MCHC: 33.3 g/dL (ref 30.0–36.0)
MCV: 93.1 fL (ref 78.0–100.0)
Monocytes Absolute: 0.6 10*3/uL (ref 0.1–1.0)
Monocytes Relative: 8 % (ref 3–12)
NEUTROS ABS: 4.2 10*3/uL (ref 1.7–7.7)
Neutrophils Relative %: 57 % (ref 43–77)
Platelets: 273 10*3/uL (ref 150–400)
RBC: 4.35 MIL/uL (ref 3.87–5.11)
RDW: 13.3 % (ref 11.5–15.5)
WBC: 7.2 10*3/uL (ref 4.0–10.5)

## 2015-04-12 MED ORDER — DOXYCYCLINE HYCLATE 100 MG PO CAPS: 100.0000 mg | ORAL_CAPSULE | Freq: Two times a day (BID) | ORAL | Status: DC

## 2015-04-12 MED ORDER — OXYCODONE-ACETAMINOPHEN 5-325 MG PO TABS: 1.0000 | ORAL_TABLET | ORAL | Status: DC | PRN

## 2015-04-12 MED ORDER — ONDANSETRON 8 MG PO TBDP
8.0000 mg | ORAL_TABLET | Freq: Once | ORAL | Status: AC
  Administered 2015-04-12: 8 mg via ORAL
  Filled 2015-04-12: qty 1

## 2015-04-12 MED ORDER — ONDANSETRON 8 MG PO TBDP: 8.0000 mg | ORAL_TABLET | Freq: Three times a day (TID) | ORAL | Status: DC | PRN

## 2015-04-12 MED ORDER — OXYCODONE-ACETAMINOPHEN 5-325 MG PO TABS
2.0000 | ORAL_TABLET | Freq: Once | ORAL | Status: AC
  Administered 2015-04-12: 2 via ORAL
  Filled 2015-04-12: qty 2

## 2015-04-12 MED ORDER — DOXYCYCLINE HYCLATE 100 MG PO TABS
100.0000 mg | ORAL_TABLET | Freq: Once | ORAL | Status: AC
  Administered 2015-04-12: 100 mg via ORAL
  Filled 2015-04-12: qty 1

## 2015-04-12 NOTE — ED Notes (Addendum)
Pt reports "boil" x2 days on left breast. Pt reports nausea,vomiting, generalized weakness, intermittent low grade fever. nad noted. Pt alert and oriented.

## 2015-04-12 NOTE — ED Notes (Signed)
MD Ray at bedside updating patient.

## 2015-04-12 NOTE — ED Provider Notes (Signed)
CSN: [id number]     Arrival date & time 04/12/15  1037 History  This chart was scribed for Mandy Boss, MD by Hilda Lias, ED Scribe. This patient was seen in room [facility] and the patient's care was started at 11:55 AM.  Chief Complaint  Patient presents with  . Abscess      Patient is a 54 y.o. female presenting with abscess. The history is provided by the patient. No language interpreter was used.  Abscess Location:  Torso Torso abscess location:  L chest Abscess quality: not draining   Duration:  2 days Ineffective treatments:  None tried Associated symptoms: fever and nausea    HPI Comments: RAYLEIGH [name] is a 54 y.o. female who presents to the Emergency Department complaining of a worsening abscess to the underside of her left breast that has been present for two days. Pt also reports having nausea, fever, and loss of appetite. Pt states there has been little-to-no drainage of the area so far but has not tried to drain it. Pt states she has not taken any medications at home.    Past Medical History  Diagnosis Date  . Hypothyroid   . Anxiety   . COPD (chronic obstructive pulmonary disease)   . GERD (gastroesophageal reflux disease)   . PUD (peptic ulcer disease)     ? remote past, no reports from [facility] or [facility], states possibly Dr. Laural Golden.   . Hiatal hernia   . Chronic abdominal pain   . Diabetes mellitus     type I  . Hyperlipidemia   . Stroke   . Polyneuropathy in diabetes   . Unspecified polyarthropathy or polyarthritis, site unspecified   . Hypertension    Past Surgical History  Procedure Laterality Date  . Abdominal hysterectomy    . Tonsillectomy    . Appendectomy    . Bladder repair    . Esophagogastroduodenoscopy      in remote past, unclear who performed. No op notes from [facility] or [facility] (pt thought Dr. Laural Golden)  . Esophagogastroduodenoscopy  02/12/2012    Dr. Gala Romney:  Normal esophagus status post 52 F dilation. Small hiatal hernia. mild chronic  gastritis  . Esophagogastroduodenoscopy N/A 02/21/2014    [id number] exudative reflux esophagitis-rule out Candida esophagitis.   Family History  Problem Relation Age of Onset  . Liver cancer Mother     remission for 2 years  . Colon cancer Neg Hx    History  Substance Use Topics  . Smoking status: Current Every Day Smoker -- 0.50 packs/day for 15 years    Types: Cigarettes  . Smokeless tobacco: Not on file  . Alcohol Use: No   OB History    No data available     Review of Systems  Constitutional: Positive for fever and appetite change.  Gastrointestinal: Positive for nausea.  Skin: Positive for color change.  All other systems reviewed and are negative.     Allergies  Phenytoin; Sulfonamide derivatives; and Tetracyclines & related  Home Medications   Prior to Admission medications   Medication Sig Start Date End Date Taking? Authorizing Provider  albuterol (PROVENTIL HFA;VENTOLIN HFA) 108 (90 BASE) MCG/ACT inhaler Inhale 2 puffs into the lungs every 6 (six) hours as needed. For shortness of breath 03/08/12  Yes Encarnacion Slates, NP  escitalopram (LEXAPRO) 10 MG tablet Take 10 mg by mouth at bedtime.    Yes Historical Provider, MD  fluticasone (FLONASE) 50 MCG/ACT nasal spray Place 2 sprays into both nostrils daily  as needed for allergies or rhinitis.   Yes Historical Provider, MD  gabapentin (NEURONTIN) 100 MG capsule Take 200 mg by mouth 4 (four) times daily.    Yes Historical Provider, MD  Insulin Glargine (LANTUS SOLOSTAR) 100 UNIT/ML Solostar Pen Inject 20 Units into the skin at bedtime.    Yes Historical Provider, MD  insulin lispro (HUMALOG) 100 UNIT/ML injection Inject 5-10 Units into the skin 3 (three) times daily as needed for high blood sugar (Uses 3 timesdaily according to sliding scale).   Yes Historical Provider, MD  levothyroxine (SYNTHROID, LEVOTHROID) 137 MCG tablet Take 1 tablet (137 mcg total) by mouth every morning. For thyroid hormone replacement 03/08/12  Yes  Encarnacion Slates, NP  lisinopril (PRINIVIL,ZESTRIL) 20 MG tablet Take 1 tablet (20 mg total) by mouth daily. 02/27/14  Yes Rosita Fire, MD  NEXIUM 40 MG capsule Take 40 mg by mouth every morning.  05/13/14  Yes Historical Provider, MD  polyethylene glycol powder (GLYCOLAX/MIRALAX) powder Take 17 g by mouth daily as needed for mild constipation.  02/03/14  Yes Historical Provider, MD  promethazine (PHENERGAN) 25 MG tablet Take 25 mg by mouth 3 (three) times daily as needed. For nausea and/or vimiting 02/03/14  Yes Historical Provider, MD  oxyCODONE-acetaminophen (PERCOCET/ROXICET) 5-325 MG per tablet Take 1 tablet by mouth every 6 (six) hours as needed for severe pain. Patient not taking: Reported on 04/12/2015 03/27/15   Carole Civil, MD  traMADol (ULTRAM) 50 MG tablet Take 50 mg by mouth 2 (two) times daily as needed for moderate pain.  02/03/14   Historical Provider, MD   BP 137/94 mmHg  Pulse 90  Temp(Src) 98.2 F (36.8 C) (Oral)  Resp 20  Ht 5\' 3"  (1.6 m)  Wt 160 lb (72.576 kg)  BMI 28.35 kg/m2  SpO2 97% Physical Exam  Constitutional: She is oriented to person, place, and time. She appears well-developed and well-nourished.  HENT:  Head: Normocephalic and atraumatic.  Right Ear: External ear normal.  Left Ear: External ear normal.  Nose: Nose normal.  Mouth/Throat: Oropharynx is clear and moist.  Eyes: Conjunctivae and EOM are normal. Pupils are equal, round, and reactive to light.  Neck: Normal range of motion. Neck supple.  Cardiovascular: Normal rate, regular rhythm, normal heart sounds and intact distal pulses.   Pulmonary/Chest: Effort normal and breath sounds normal.  Abdominal: Soft. Bowel sounds are normal.  Musculoskeletal: Normal range of motion.  Neurological: She is alert and oriented to person, place, and time. She has normal reflexes.  Skin: Skin is warm and dry. Rash noted.     2 cm diameter pustule draining, no fluctuance or induration  Psychiatric: She has a  normal mood and affect. Her behavior is normal. Judgment and thought content normal.  Nursing note and vitals reviewed.   ED Course  Procedures (including critical care time)  DIAGNOSTIC STUDIES: Oxygen Saturation is 97% on room air, normal by my interpretation.    COORDINATION OF CARE: 11:57 AM Discussed treatment plan with pt at bedside and pt agreed to plan.   Labs Review Labs Reviewed  BASIC METABOLIC PANEL - Abnormal; Notable for the following:    Sodium 133 (*)    Chloride 99 (*)    Glucose, Bld 404 (*)    All other components within normal limits  CBC WITH DIFFERENTIAL/PLATELET    Imaging Review No results found.   EKG Interpretation None      MDM   Final diagnoses:  Hyperglycemia  Cellulitis of  chest wall   54 year old female with diabetes who presents today with a erythematous draining area under her left breast. This appears to be superficial but is draining. He did not feel any underlying abscess. Her white blood cell count is normal and she does not appear to be systemically ill. She received doxycycline here in the emergency department. I did note that she had a stated allergy to this but the only prior reaction she had to this was nausea and vomiting. She has taken here without difficulty. She is in agreement with starting doxycycline. She is given a prescription for doxycycline, Zofran, and Percocet. I have discussed return precautions especially fever, worsening symptoms, and she voices understanding. Her blood sugars elevated here 400. She did not take her sliding scale prior to coming in. We discussed that she states this and follow her blood sugars carefully as an outpatient she again voices understanding. I personally performed the services described in this documentation, which was scribed in my presence. The recorded information has been reviewed and considered.   Mandy Boss, MD 04/12/15 [phone]

## 2015-04-12 NOTE — ED Notes (Signed)
MD Ray at bedside. 

## 2015-04-12 NOTE — Discharge Instructions (Signed)
Abscess °An abscess (boil or furuncle) is an infected area on or under the skin. This area is filled with yellowish-white fluid (pus) and other material (debris). °HOME CARE  °· Only take medicines as told by your doctor. °· If you were given antibiotic medicine, take it as directed. Finish the medicine even if you start to feel better. °· If gauze is used, follow your doctor's directions for changing the gauze. °· To avoid spreading the infection: °¨ Keep your abscess covered with a bandage. °¨ Wash your hands well. °¨ Do not share personal care items, towels, or whirlpools with others. °¨ Avoid skin contact with others. °· Keep your skin and clothes clean around the abscess. °· Keep all doctor visits as told. °GET HELP RIGHT AWAY IF:  °· You have more pain, puffiness (swelling), or redness in the wound site. °· You have more fluid or blood coming from the wound site. °· You have muscle aches, chills, or you feel sick. °· You have a fever. °MAKE SURE YOU:  °· Understand these instructions. °· Will watch your condition. °· Will get help right away if you are not doing well or get worse. °Document Released: 02/25/2008 Document Revised: 03/09/2012 Document Reviewed: 11/21/2011 °Exit[employer]® Patient Information ©2015 [employer]. This information is not intended to replace advice given to you by your health care provider. Make sure you discuss any questions you have with your health care provider. ° °

## 2015-04-16 ENCOUNTER — Ambulatory Visit: Payer: Medicare Other | Admitting: Obstetrics and Gynecology

## 2015-04-16 ENCOUNTER — Encounter: Payer: Self-pay | Admitting: Obstetrics and Gynecology

## 2015-04-20 ENCOUNTER — Ambulatory Visit: Payer: Medicare Other | Admitting: Gastroenterology

## 2015-04-23 ENCOUNTER — Other Ambulatory Visit (HOSPITAL_COMMUNITY): Payer: Self-pay | Admitting: Internal Medicine

## 2015-04-23 DIAGNOSIS — F17200 Nicotine dependence, unspecified, uncomplicated: Secondary | ICD-10-CM | POA: Diagnosis not present

## 2015-04-23 DIAGNOSIS — M2550 Pain in unspecified joint: Secondary | ICD-10-CM | POA: Diagnosis not present

## 2015-04-23 DIAGNOSIS — E1140 Type 2 diabetes mellitus with diabetic neuropathy, unspecified: Secondary | ICD-10-CM | POA: Diagnosis not present

## 2015-04-23 DIAGNOSIS — K219 Gastro-esophageal reflux disease without esophagitis: Secondary | ICD-10-CM | POA: Diagnosis not present

## 2015-04-23 DIAGNOSIS — F419 Anxiety disorder, unspecified: Secondary | ICD-10-CM | POA: Diagnosis not present

## 2015-04-23 DIAGNOSIS — Z1231 Encounter for screening mammogram for malignant neoplasm of breast: Secondary | ICD-10-CM

## 2015-04-23 DIAGNOSIS — R5381 Other malaise: Secondary | ICD-10-CM | POA: Diagnosis not present

## 2015-04-23 DIAGNOSIS — E785 Hyperlipidemia, unspecified: Secondary | ICD-10-CM | POA: Diagnosis not present

## 2015-04-23 DIAGNOSIS — E1165 Type 2 diabetes mellitus with hyperglycemia: Secondary | ICD-10-CM | POA: Diagnosis not present

## 2015-04-23 DIAGNOSIS — J449 Chronic obstructive pulmonary disease, unspecified: Secondary | ICD-10-CM | POA: Diagnosis not present

## 2015-04-23 DIAGNOSIS — E039 Hypothyroidism, unspecified: Secondary | ICD-10-CM | POA: Diagnosis not present

## 2015-04-26 ENCOUNTER — Ambulatory Visit (HOSPITAL_COMMUNITY): Payer: Medicare Other | Attending: Orthopedic Surgery | Admitting: Physical Therapy

## 2015-04-30 ENCOUNTER — Ambulatory Visit (HOSPITAL_COMMUNITY)
Admission: RE | Admit: 2015-04-30 | Discharge: 2015-04-30 | Disposition: A | Discharge status: Home / Self Care | Payer: Medicare Other | Source: Ambulatory Visit | Attending: Internal Medicine | Admitting: Internal Medicine

## 2015-04-30 DIAGNOSIS — Z1231 Encounter for screening mammogram for malignant neoplasm of breast: Secondary | ICD-10-CM | POA: Diagnosis not present

## 2015-05-01 ENCOUNTER — Other Ambulatory Visit: Payer: Self-pay | Admitting: Internal Medicine

## 2015-05-01 DIAGNOSIS — R928 Other abnormal and inconclusive findings on diagnostic imaging of breast: Secondary | ICD-10-CM

## 2015-05-08 ENCOUNTER — Ambulatory Visit (INDEPENDENT_AMBULATORY_CARE_PROVIDER_SITE_OTHER): Payer: Self-pay | Admitting: Orthopedic Surgery

## 2015-05-08 ENCOUNTER — Encounter: Payer: Self-pay | Admitting: Orthopedic Surgery

## 2015-05-08 ENCOUNTER — Ambulatory Visit (INDEPENDENT_AMBULATORY_CARE_PROVIDER_SITE_OTHER): Payer: Medicare Other

## 2015-05-08 VITALS — BP 101/76 | Ht 63.0 in | Wt 160.0 lb

## 2015-05-08 DIAGNOSIS — M25562 Pain in left knee: Secondary | ICD-10-CM

## 2015-05-08 DIAGNOSIS — S83242D Other tear of medial meniscus, current injury, left knee, subsequent encounter: Secondary | ICD-10-CM

## 2015-05-08 MED ORDER — OXYCODONE-ACETAMINOPHEN 5-325 MG PO TABS: 1.0000 | ORAL_TABLET | Freq: Four times a day (QID) | ORAL | Status: DC | PRN

## 2015-05-08 NOTE — Progress Notes (Signed)
Chief Complaint  Patient presents with  . Follow-up    6 week follow up left knee, tibial plateau fx s/p therapy, DOI 01/27/15 (SLIP IN SHOWER, POSSIBLE REINJURY)    Recheck status post physical therapy for left knee patient fell in the bathtub 2 weeks ago now complains of medial joint line pain she is in the midst of being treated for tibial plateau fracture with bracing and therapy. She says is more difficult to bend her knee and she has pain over the medial joint line with swelling  Past Medical History  Diagnosis Date  . Hypothyroid   . Anxiety   . COPD (chronic obstructive pulmonary disease)   . GERD (gastroesophageal reflux disease)   . PUD (peptic ulcer disease)     ? remote past, no reports from [facility] or [facility], states possibly Dr. Laural Golden.   . Hiatal hernia   . Chronic abdominal pain   . Diabetes mellitus     type I  . Hyperlipidemia   . Stroke   . Polyneuropathy in diabetes   . Unspecified polyarthropathy or polyarthritis, site unspecified   . Hypertension     BP 101/76 mmHg  Ht 5\' 3"  (1.6 m)  Wt 160 lb (72.576 kg)  BMI 28.35 kg/m2 Her mood is pleasant she is oriented 3 gait is remarkable for an antalgic gait Gen. appearance is normal frame is small and thin ectomorphic body habitus. Evaluation of the knee reveals medial joint line tenderness passive flexion can be forced to 120 with pain she has a severe 90 active range of motion. Her McMurray sign was positive collateral ligaments were stable AP ligaments were stable motor exam normal neurovascular exam intact  System review was negative  Recommend MRI evaluate left knee for torn medial meniscus. Continue brace hinged knee brace. Percocet 5 mg 1 every 6 #28

## 2015-05-08 NOTE — Patient Instructions (Addendum)
We will schedule MRI for you and call you with appt and results Continue wearing brace

## 2015-05-14 ENCOUNTER — Other Ambulatory Visit: Payer: Self-pay | Admitting: *Deleted

## 2015-05-14 MED ORDER — OXYCODONE-ACETAMINOPHEN 5-325 MG PO TABS: 1.0000 | ORAL_TABLET | Freq: Four times a day (QID) | ORAL | Status: DC | PRN

## 2015-05-15 ENCOUNTER — Telehealth: Payer: Self-pay | Admitting: Orthopedic Surgery

## 2015-05-15 ENCOUNTER — Other Ambulatory Visit: Payer: Self-pay | Admitting: *Deleted

## 2015-05-15 ENCOUNTER — Encounter (HOSPITAL_COMMUNITY): Payer: Self-pay

## 2015-05-15 ENCOUNTER — Telehealth: Payer: Self-pay | Admitting: *Deleted

## 2015-05-15 NOTE — Telephone Encounter (Signed)
Opened in Error.

## 2015-05-15 NOTE — Telephone Encounter (Signed)
Patient called asking if we made a mistake, states we only ordered #28 of oxycodone and we usually order more

## 2015-05-15 NOTE — Telephone Encounter (Signed)
Patient is questioning the dosage on the most recent Rx for oxyCODONE-acetaminophen (PERCOCET/ROXICET) 5-325 MG per tablet, she thinks that Dr. Aline Brochure made a mistake giving her #28 it should be more she states, please advise?

## 2015-05-16 ENCOUNTER — Encounter: Payer: Self-pay | Admitting: Gastroenterology

## 2015-05-16 ENCOUNTER — Other Ambulatory Visit: Payer: Self-pay

## 2015-05-16 ENCOUNTER — Ambulatory Visit (INDEPENDENT_AMBULATORY_CARE_PROVIDER_SITE_OTHER): Payer: Medicare Other | Admitting: Gastroenterology

## 2015-05-16 VITALS — BP 94/62 | HR 89 | Temp 97.6°F | Ht 64.0 in | Wt 159.4 lb

## 2015-05-16 DIAGNOSIS — K2100 Gastro-esophageal reflux disease with esophagitis, without bleeding: Secondary | ICD-10-CM

## 2015-05-16 DIAGNOSIS — K5900 Constipation, unspecified: Secondary | ICD-10-CM

## 2015-05-16 DIAGNOSIS — R1314 Dysphagia, pharyngoesophageal phase: Secondary | ICD-10-CM

## 2015-05-16 DIAGNOSIS — Z1211 Encounter for screening for malignant neoplasm of colon: Secondary | ICD-10-CM

## 2015-05-16 DIAGNOSIS — R112 Nausea with vomiting, unspecified: Secondary | ICD-10-CM

## 2015-05-16 DIAGNOSIS — R1319 Other dysphagia: Secondary | ICD-10-CM

## 2015-05-16 DIAGNOSIS — R1084 Generalized abdominal pain: Secondary | ICD-10-CM

## 2015-05-16 DIAGNOSIS — R1310 Dysphagia, unspecified: Secondary | ICD-10-CM

## 2015-05-16 DIAGNOSIS — K219 Gastro-esophageal reflux disease without esophagitis: Secondary | ICD-10-CM

## 2015-05-16 DIAGNOSIS — R109 Unspecified abdominal pain: Secondary | ICD-10-CM

## 2015-05-16 MED ORDER — ONDANSETRON HCL 4 MG PO TABS: 4.0000 mg | ORAL_TABLET | ORAL | Status: DC | PRN

## 2015-05-16 MED ORDER — PEG 3350-KCL-NA BICARB-NACL 420 G PO SOLR: 4000.0000 mL | Freq: Once | ORAL | Status: DC

## 2015-05-16 MED ORDER — LINACLOTIDE 290 MCG PO CAPS: 290.0000 ug | ORAL_CAPSULE | Freq: Every day | ORAL | Status: DC

## 2015-05-16 MED ORDER — PANTOPRAZOLE SODIUM 40 MG PO TBEC: 40.0000 mg | DELAYED_RELEASE_TABLET | Freq: Every day | ORAL | Status: DC

## 2015-05-16 NOTE — Assessment & Plan Note (Signed)
Poorly controlled GERD, chronic nauseas, esophageal dysphagia/odynophagia in setting of h/o ulcerative esophagitis/candida esophagitis. Uncontrolled on Nexium once daily. Trial of pantoprazole. Cannot rule out underlying gastroparesis. Complains of generalized abdominal pain unclear etiology.   Recommend EGD+/-ED in the near future.  I have discussed the risks, alternatives, benefits with regards to but not limited to the risk of reaction to medication, bleeding, infection, perforation and the patient is agreeable to proceed. Written consent to be obtained.  Augment conscious sedation with phenergan 25mg  IV 30 minutes before the procedure due to polypharmacy.

## 2015-05-16 NOTE — Progress Notes (Signed)
Primary Care Physician:  Rosita Fire, MD  Primary Gastroenterologist: Garfield Cornea, MD    Chief Complaint  Patient presents with  . Colonoscopy  . Gastrophageal Reflux    HPI:  [name] is a 54 y.o. female here to schedule colonoscopy and for f/u GERD. She has history of ulcerative reflux esophagitis and candida esophagitis with hospitalization in 02/2014 for DKA and hematemesis. EGD at that time. No prior colonoscopy. Patient with history of RA.   Complains of daily nausea with intermittent vomiting. Has been on antiemetics for years. Taking phenergan 3 times a day. Has been on nexium for years. No other PPI. Better when on BID but reports she was changed to once a day after her hospitalization last year. She complains of vague odynophagia, esophageal dysphagia to everything. Usually constipation, BM every other day. No melena, brbpr. Recently on pain meds due to right ankle and left knee fracture. Weaning off now.   .    Current Outpatient Prescriptions  Medication Sig Dispense Refill  . albuterol (PROVENTIL HFA;VENTOLIN HFA) 108 (90 BASE) MCG/ACT inhaler Inhale 2 puffs into the lungs every 6 (six) hours as needed. For shortness of breath    . escitalopram (LEXAPRO) 10 MG tablet Take 10 mg by mouth at bedtime.     . fluticasone (FLONASE) 50 MCG/ACT nasal spray Place 2 sprays into both nostrils daily as needed for allergies or rhinitis.    Marland Kitchen gabapentin (NEURONTIN) 100 MG capsule Take 200 mg by mouth 4 (four) times daily.     . Insulin Glargine (LANTUS SOLOSTAR) 100 UNIT/ML Solostar Pen Inject 30 Units into the skin at bedtime.     . insulin lispro (HUMALOG) 100 UNIT/ML injection Inject 5-10 Units into the skin 3 (three) times daily as needed for high blood sugar (Uses 3 timesdaily according to sliding scale).    Marland Kitchen levothyroxine (SYNTHROID, LEVOTHROID) 137 MCG tablet Take 1 tablet (137 mcg total) by mouth every morning. For thyroid hormone replacement    . lisinopril  (PRINIVIL,ZESTRIL) 20 MG tablet Take 1 tablet (20 mg total) by mouth daily. 30 tablet 0  . NEXIUM 40 MG capsule Take 40 mg by mouth every morning.     . ondansetron (ZOFRAN ODT) 8 MG disintegrating tablet Take 1 tablet (8 mg total) by mouth every 8 (eight) hours as needed for nausea or vomiting. 20 tablet 0  . traMADol (ULTRAM) 50 MG tablet Take 50 mg by mouth 2 (two) times daily as needed for moderate pain.      No current facility-administered medications for this visit.    Allergies as of 05/16/2015 - Review Complete 05/16/2015  Allergen Reaction Noted  . Phenytoin Anaphylaxis 03/21/2010  . Sulfonamide derivatives Nausea And Vomiting 02/09/2012  . Tetracyclines & related Nausea And Vomiting 09/01/2011    Past Medical History  Diagnosis Date  . Hypothyroid   . Anxiety   . COPD (chronic obstructive pulmonary disease)   . GERD (gastroesophageal reflux disease)   . PUD (peptic ulcer disease)     ? remote past, no reports from [facility] or [facility], states possibly Dr. Laural Golden.   . Hiatal hernia   . Chronic abdominal pain   . Diabetes mellitus     type I  . Hyperlipidemia   . Stroke   . Polyneuropathy in diabetes   . Unspecified polyarthropathy or polyarthritis, site unspecified   . Hypertension     Past Surgical History  Procedure Laterality Date  . Abdominal hysterectomy    . Tonsillectomy    .  Appendectomy    . Bladder repair    . Esophagogastroduodenoscopy      in remote past, unclear who performed. No op notes from [facility] or [facility] (pt thought Dr. Laural Golden)  . Esophagogastroduodenoscopy  02/12/2012    Dr. Gala Romney:  Normal esophagus status post 80 F dilation. Small hiatal hernia. mild chronic gastritis  . Esophagogastroduodenoscopy N/A 02/21/2014    HER:DEYCXK exudative reflux esophagitis-and + Candida esophagitis.    Family History  Problem Relation Age of Onset  . Liver cancer Mother     remission for 2 years  . Colon cancer Neg Hx     Social History   Social History   . Marital Status: Divorced    Spouse Name: N/A  . Number of Children: N/A  . Years of Education: N/A   Occupational History  . Not on file.   Social History Main Topics  . Smoking status: Current Every Day Smoker -- 0.50 packs/day for 15 years    Types: Cigarettes  . Smokeless tobacco: Not on file  . Alcohol Use: No  . Drug Use: Yes    Special: Cocaine     Comment: none since 2013.  Marland Kitchen Sexual Activity: Not on file   Other Topics Concern  . Not on file   Social History Narrative      ROS:  General: Negative for anorexia, weight loss, fever, chills, fatigue, weakness. Eyes: Negative for vision changes.  ENT: Negative for hoarseness, difficulty swallowing , nasal congestion. CV: Negative for chest pain, angina, palpitations, dyspnea on exertion, peripheral edema.  Respiratory: Negative for dyspnea at rest, dyspnea on exertion, cough, sputum, wheezing.  GI: See history of present illness. GU:  Negative for dysuria, hematuria, urinary incontinence, urinary frequency, nocturnal urination.  MS: +joint pain due to fractures. No low back pain.  Derm: Negative for rash or itching.  Neuro: Negative for weakness, abnormal sensation, seizure, frequent headaches, memory loss, confusion.  Psych: Negative for anxiety, depression, suicidal ideation, hallucinations.  Endo: Negative for unusual weight change.  Heme: Negative for bruising or bleeding. Allergy: Negative for rash or hives.    Physical Examination:  BP 94/62 mmHg  Pulse 89  Temp(Src) 97.6 F (36.4 C) (Oral)  Ht 5\' 4"  (1.626 m)  Wt 159 lb 6.4 oz (72.303 kg)  BMI 27.35 kg/m2   General: Well-nourished, well-developed in no acute distress.  Head: Normocephalic, atraumatic.   Eyes: Conjunctiva pink, no icterus. Mouth: Oropharyngeal mucosa moist and pink , no lesions erythema or exudate. Neck: Supple without thyromegaly, masses, or lymphadenopathy.  Lungs: Clear to auscultation bilaterally.  Heart: Regular rate and  rhythm, no murmurs rubs or gallops.  Abdomen: Bowel sounds are normal, mild diffuse tenderness, nondistended, no hepatosplenomegaly or masses, no abdominal bruits or    hernia , no rebound or guarding.   Rectal: not performed Extremities: No lower extremity edema. No clubbing or deformities.  Neuro: Alert and oriented x 4 , grossly normal neurologically.  Skin: Warm and dry, no rash or jaundice.   Psych: Alert and cooperative, normal mood and affect.  Labs: Lab Results  Component Value Date   WBC 7.2 04/12/2015   HGB 13.5 04/12/2015   HCT 40.5 04/12/2015   MCV 93.1 04/12/2015   PLT 273 04/12/2015   Lab Results  Component Value Date   CREATININE 0.87 04/12/2015   BUN 19 04/12/2015   NA 133* 04/12/2015   K 4.3 04/12/2015   CL 99* 04/12/2015   CO2 25 04/12/2015   Lab Results  Component Value Date   ALT 5 02/22/2014   AST 12 02/22/2014   ALKPHOS 98 02/22/2014   BILITOT 0.4 02/22/2014     Imaging Studies: Mm Screening Breast Tomo Bilateral  04/30/2015   CLINICAL DATA:  Screening. Baseline.  EXAM: DIGITAL SCREENING BILATERAL MAMMOGRAM WITH 3D TOMO WITH CAD  COMPARISON:  None  ACR Breast Density Category b: There are scattered areas of fibroglandular density.  FINDINGS: In the right breast multiple small groups of calcifications require further evaluation.  In the left breast a possible mass in the upper-outer quadrant requires further evaluation.  Images were processed with CAD.  IMPRESSION: Further evaluation is suggested for the groups of calcifications in the right breast.  Further evaluation is suggested for possible mass in the left breast.  RECOMMENDATION: Diagnostic mammogram of both breasts with possible ultrasound of the left breast. (Code:[id number])  The patient will be contacted regarding the findings, and additional imaging will be scheduled.  BI-RADS CATEGORY  0: Incomplete. Need additional imaging evaluation and/or prior mammograms for comparison.   Electronically Signed    By: Ammie Ferrier M.D.   On: 04/30/2015 16:54   Dg Knee Ap/lat W/sunrise Left  05/09/2015   3 views left knee  Patient with history of left tibial plateau fracture. [place] again.  Complaint a medial joint line pain. The fracture shows no significant  depression or angulation. The fracture appears to have healed nicely.  Impression healed tibial plateau fracture with no additional acute  findings

## 2015-05-16 NOTE — Assessment & Plan Note (Signed)
First ever colonoscopy in near future.  I have discussed the risks, alternatives, benefits with regards to but not limited to the risk of reaction to medication, bleeding, infection, perforation and the patient is agreeable to proceed. Written consent to be obtained.

## 2015-05-16 NOTE — Patient Instructions (Signed)
1. Colonoscopy and upper endoscopy with Dr. Gala Romney. Please see separate instructions. 2. Prescriptions for Zofran, Linzess (for constipation), pantoprazole (for GERD) sent to pharmacy. You will have to try and fail a couple of different acid reflux medications before Dexilant would be approved.

## 2015-05-16 NOTE — Assessment & Plan Note (Signed)
Trial of Linzess 214mcg daily.

## 2015-05-17 NOTE — Progress Notes (Signed)
CC'ED TO PCP 

## 2015-05-21 ENCOUNTER — Ambulatory Visit: Payer: Medicare Other | Admitting: Women's Health

## 2015-05-22 ENCOUNTER — Other Ambulatory Visit (HOSPITAL_COMMUNITY): Payer: Self-pay | Admitting: Internal Medicine

## 2015-05-22 ENCOUNTER — Other Ambulatory Visit: Payer: Self-pay | Admitting: Internal Medicine

## 2015-05-22 DIAGNOSIS — R921 Mammographic calcification found on diagnostic imaging of breast: Secondary | ICD-10-CM

## 2015-05-22 DIAGNOSIS — N6320 Unspecified lump in the left breast, unspecified quadrant: Secondary | ICD-10-CM

## 2015-05-22 DIAGNOSIS — R928 Other abnormal and inconclusive findings on diagnostic imaging of breast: Secondary | ICD-10-CM

## 2015-05-23 ENCOUNTER — Ambulatory Visit (INDEPENDENT_AMBULATORY_CARE_PROVIDER_SITE_OTHER): Payer: Medicare Other | Admitting: Adult Health

## 2015-05-23 ENCOUNTER — Other Ambulatory Visit (HOSPITAL_COMMUNITY): Payer: Self-pay

## 2015-05-23 ENCOUNTER — Encounter: Payer: Self-pay | Admitting: Adult Health

## 2015-05-23 VITALS — BP 130/70 | HR 76 | Ht 63.0 in | Wt 165.0 lb

## 2015-05-23 DIAGNOSIS — B9689 Other specified bacterial agents as the cause of diseases classified elsewhere: Secondary | ICD-10-CM

## 2015-05-23 DIAGNOSIS — N760 Acute vaginitis: Secondary | ICD-10-CM | POA: Insufficient documentation

## 2015-05-23 DIAGNOSIS — N898 Other specified noninflammatory disorders of vagina: Secondary | ICD-10-CM | POA: Diagnosis not present

## 2015-05-23 DIAGNOSIS — A499 Bacterial infection, unspecified: Secondary | ICD-10-CM

## 2015-05-23 HISTORY — DX: Other specified noninflammatory disorders of vagina: N89.8

## 2015-05-23 HISTORY — DX: Other specified bacterial agents as the cause of diseases classified elsewhere: B96.89

## 2015-05-23 LAB — POCT WET PREP (WET MOUNT)
Clue Cells Wet Prep Whiff POC: POSITIVE
WBC, Wet Prep HPF POC: POSITIVE

## 2015-05-23 MED ORDER — METRONIDAZOLE 500 MG PO TABS: 500.0000 mg | ORAL_TABLET | Freq: Two times a day (BID) | ORAL | Status: DC

## 2015-05-23 MED ORDER — FLUCONAZOLE 150 MG PO TABS: 150.0000 mg | ORAL_TABLET | Freq: Once | ORAL | Status: DC

## 2015-05-23 NOTE — Progress Notes (Signed)
Subjective:     Patient ID: ARINE FOLEY, female   DOB: 09/03/1961, 54 y.o.   MRN: [id number]  HPI Lynnleigh is a 54 year old white female in complaining of vaginal discharge with itching, no sex in over a year, had trich last year.  Review of Systems  Patient denies any headaches, hearing loss, fatigue, blurred vision, shortness of breath, chest pain, abdominal pain, problems with bowel movements, urination, or intercourse(not having sex). No joint pain or mood swings.+vaginal discharge with itch Reviewed past medical,surgical, social and family history. Reviewed medications and allergies.     Objective:   Physical Exam BP 130/70 mmHg  Pulse 76  Ht 5\' 3"  (1.6 m)  Wt 165 lb (74.844 kg)  BMI 29.24 kg/m2    Skin warm and dry.Pelvic: external genitalia is normal in appearance no lesions, vagina: white to yellowish discharge with odor,urethra has no lesions or masses noted, cervix and uterus are absent adnexa: no masses or tenderness noted. Bladder is non tender and no masses felt. Wet prep: + for clue cells and +WBCs.  Assessment:    Vaginal discharge BV    Plan:     Rx flagyl 500 mg 1 bid x 7 days, no alcohol, review handout on BV   No sex during treatment

## 2015-05-23 NOTE — Addendum Note (Signed)
Addended by: Derrek Monaco A on: 05/23/2015 12:56 PM   Modules accepted: Orders

## 2015-05-23 NOTE — Patient Instructions (Signed)
Bacterial Vaginosis Bacterial vaginosis is a vaginal infection that occurs when the normal balance of bacteria in the vagina is disrupted. It results from an overgrowth of certain bacteria. This is the most common vaginal infection in women of childbearing age. Treatment is important to prevent complications, especially in pregnant women, as it can cause a premature delivery. CAUSES  Bacterial vaginosis is caused by an increase in harmful bacteria that are normally present in smaller amounts in the vagina. Several different kinds of bacteria can cause bacterial vaginosis. However, the reason that the condition develops is not fully understood. RISK FACTORS Certain activities or behaviors can put you at an increased risk of developing bacterial vaginosis, including:  Having a new sex partner or multiple sex partners.  Douching.  Using an intrauterine device (IUD) for contraception. Women do not get bacterial vaginosis from toilet seats, bedding, swimming pools, or contact with objects around them. SIGNS AND SYMPTOMS  Some women with bacterial vaginosis have no signs or symptoms. Common symptoms include:  Grey vaginal discharge.  A fishlike odor with discharge, especially after sexual intercourse.  Itching or burning of the vagina and vulva.  Burning or pain with urination. DIAGNOSIS  Your health care provider will take a medical history and examine the vagina for signs of bacterial vaginosis. A sample of vaginal fluid may be taken. Your health care provider will look at this sample under a microscope to check for bacteria and abnormal cells. A vaginal pH test may also be done.  TREATMENT  Bacterial vaginosis may be treated with antibiotic medicines. These may be given in the form of a pill or a vaginal cream. A second round of antibiotics may be prescribed if the condition comes back after treatment.  HOME CARE INSTRUCTIONS   Only take over-the-counter or prescription medicines as  directed by your health care provider.  If antibiotic medicine was prescribed, take it as directed. Make sure you finish it even if you start to feel better.  Do not have sex until treatment is completed.  Tell all sexual partners that you have a vaginal infection. They should see their health care provider and be treated if they have problems, such as a mild rash or itching.  Practice safe sex by using condoms and only having one sex partner. SEEK MEDICAL CARE IF:   Your symptoms are not improving after 3 days of treatment.  You have increased discharge or pain.  You have a fever. MAKE SURE YOU:   Understand these instructions.  Will watch your condition.  Will get help right away if you are not doing well or get worse. FOR MORE INFORMATION  Centers for Disease Control and Prevention, Division of STD Prevention: [employer]raiser[employer]raud[employer] American Sexual Health Association (ASHA): www.ashastd.org  Document Released: 09/08/2005 Document Revised: 06/29/2013 Document Reviewed: 04/20/2013 [facility] Patient Information 2015 [place], [place]. This information is not intended to replace advice given to you by your health care provider. Make sure you discuss any questions you have with your health care provider. No alcohol take flagyl  No sex follow up prn

## 2015-05-29 ENCOUNTER — Encounter (HOSPITAL_COMMUNITY): Payer: Self-pay

## 2015-06-01 ENCOUNTER — Ambulatory Visit (HOSPITAL_COMMUNITY)
Admission: RE | Admit: 2015-06-01 | Discharge: 2015-06-01 | Disposition: A | Discharge status: Home / Self Care | Payer: Medicare Other | Source: Ambulatory Visit | Attending: Internal Medicine | Admitting: Internal Medicine

## 2015-06-01 ENCOUNTER — Encounter (HOSPITAL_COMMUNITY)
Admission: RE | Disposition: A | Discharge status: Home / Self Care | Payer: Self-pay | Source: Ambulatory Visit | Attending: Internal Medicine

## 2015-06-01 ENCOUNTER — Encounter (HOSPITAL_COMMUNITY): Payer: Self-pay | Admitting: *Deleted

## 2015-06-01 DIAGNOSIS — J449 Chronic obstructive pulmonary disease, unspecified: Secondary | ICD-10-CM | POA: Diagnosis not present

## 2015-06-01 DIAGNOSIS — Z808 Family history of malignant neoplasm of other organs or systems: Secondary | ICD-10-CM | POA: Diagnosis not present

## 2015-06-01 DIAGNOSIS — Z794 Long term (current) use of insulin: Secondary | ICD-10-CM | POA: Diagnosis not present

## 2015-06-01 DIAGNOSIS — F17210 Nicotine dependence, cigarettes, uncomplicated: Secondary | ICD-10-CM | POA: Insufficient documentation

## 2015-06-01 DIAGNOSIS — Z79899 Other long term (current) drug therapy: Secondary | ICD-10-CM | POA: Diagnosis not present

## 2015-06-01 DIAGNOSIS — E1142 Type 2 diabetes mellitus with diabetic polyneuropathy: Secondary | ICD-10-CM | POA: Insufficient documentation

## 2015-06-01 DIAGNOSIS — E039 Hypothyroidism, unspecified: Secondary | ICD-10-CM | POA: Insufficient documentation

## 2015-06-01 DIAGNOSIS — K648 Other hemorrhoids: Secondary | ICD-10-CM | POA: Diagnosis not present

## 2015-06-01 DIAGNOSIS — R1310 Dysphagia, unspecified: Secondary | ICD-10-CM | POA: Diagnosis not present

## 2015-06-01 DIAGNOSIS — R109 Unspecified abdominal pain: Secondary | ICD-10-CM

## 2015-06-01 DIAGNOSIS — K635 Polyp of colon: Secondary | ICD-10-CM

## 2015-06-01 DIAGNOSIS — K5900 Constipation, unspecified: Secondary | ICD-10-CM

## 2015-06-01 DIAGNOSIS — K449 Diaphragmatic hernia without obstruction or gangrene: Secondary | ICD-10-CM | POA: Diagnosis not present

## 2015-06-01 DIAGNOSIS — E785 Hyperlipidemia, unspecified: Secondary | ICD-10-CM | POA: Diagnosis not present

## 2015-06-01 DIAGNOSIS — I10 Essential (primary) hypertension: Secondary | ICD-10-CM | POA: Diagnosis not present

## 2015-06-01 DIAGNOSIS — Z1211 Encounter for screening for malignant neoplasm of colon: Secondary | ICD-10-CM | POA: Insufficient documentation

## 2015-06-01 DIAGNOSIS — K219 Gastro-esophageal reflux disease without esophagitis: Secondary | ICD-10-CM | POA: Diagnosis not present

## 2015-06-01 DIAGNOSIS — D127 Benign neoplasm of rectosigmoid junction: Secondary | ICD-10-CM | POA: Diagnosis not present

## 2015-06-01 DIAGNOSIS — R1314 Dysphagia, pharyngoesophageal phase: Secondary | ICD-10-CM | POA: Diagnosis not present

## 2015-06-01 DIAGNOSIS — R112 Nausea with vomiting, unspecified: Secondary | ICD-10-CM

## 2015-06-01 DIAGNOSIS — F419 Anxiety disorder, unspecified: Secondary | ICD-10-CM | POA: Diagnosis not present

## 2015-06-01 DIAGNOSIS — Z86010 Personal history of colonic polyps: Secondary | ICD-10-CM | POA: Insufficient documentation

## 2015-06-01 HISTORY — PX: ESOPHAGOGASTRODUODENOSCOPY: SHX5428

## 2015-06-01 HISTORY — PX: COLONOSCOPY: SHX5424

## 2015-06-01 LAB — GLUCOSE, CAPILLARY
GLUCOSE-CAPILLARY: 186 mg/dL — AB (ref 65–99)
GLUCOSE-CAPILLARY: 254 mg/dL — AB (ref 65–99)
Glucose-Capillary: 398 mg/dL — ABNORMAL HIGH (ref 65–99)

## 2015-06-01 SURGERY — COLONOSCOPY
Anesthesia: Moderate Sedation

## 2015-06-01 MED ORDER — STERILE WATER FOR IRRIGATION IR SOLN
Status: DC | PRN
  Administered 2015-06-01: 13:00:00

## 2015-06-01 MED ORDER — LIDOCAINE VISCOUS 2 % MT SOLN
OROMUCOSAL | Status: DC | PRN
  Administered 2015-06-01: 3 mL via OROMUCOSAL

## 2015-06-01 MED ORDER — INSULIN ASPART 100 UNIT/ML ~~LOC~~ SOLN
20.0000 [IU] | Freq: Once | SUBCUTANEOUS | Status: AC
  Administered 2015-06-01: 20 [IU] via SUBCUTANEOUS
  Filled 2015-06-01: qty 0.2

## 2015-06-01 MED ORDER — MEPERIDINE HCL 100 MG/ML IJ SOLN
INTRAMUSCULAR | Status: DC | PRN
  Administered 2015-06-01: 50 mg via INTRAVENOUS
  Administered 2015-06-01: 25 mg via INTRAVENOUS

## 2015-06-01 MED ORDER — SODIUM CHLORIDE 0.9 % IV SOLN
INTRAVENOUS | Status: DC
  Administered 2015-06-01: 13:00:00 via INTRAVENOUS

## 2015-06-01 MED ORDER — PROMETHAZINE HCL 25 MG/ML IJ SOLN
25.0000 mg | Freq: Once | INTRAMUSCULAR | Status: AC
  Administered 2015-06-01: 25 mg via INTRAVENOUS

## 2015-06-01 MED ORDER — MEPERIDINE HCL 100 MG/ML IJ SOLN
INTRAMUSCULAR | Status: AC
  Filled 2015-06-01: qty 2

## 2015-06-01 MED ORDER — ONDANSETRON HCL 4 MG/2ML IJ SOLN
INTRAMUSCULAR | Status: DC | PRN
  Administered 2015-06-01: 4 mg via INTRAVENOUS

## 2015-06-01 MED ORDER — MIDAZOLAM HCL 5 MG/5ML IJ SOLN
INTRAMUSCULAR | Status: DC | PRN
  Administered 2015-06-01: 2 mg via INTRAVENOUS
  Administered 2015-06-01 (×2): 1 mg via INTRAVENOUS

## 2015-06-01 MED ORDER — MIDAZOLAM HCL 5 MG/5ML IJ SOLN
INTRAMUSCULAR | Status: AC
  Filled 2015-06-01: qty 10

## 2015-06-01 MED ORDER — LIDOCAINE VISCOUS 2 % MT SOLN
OROMUCOSAL | Status: AC
  Filled 2015-06-01: qty 15

## 2015-06-01 MED ORDER — PROMETHAZINE HCL 25 MG/ML IJ SOLN
INTRAMUSCULAR | Status: AC
  Filled 2015-06-01: qty 1

## 2015-06-01 MED ORDER — SODIUM CHLORIDE 0.9 % IJ SOLN
INTRAMUSCULAR | Status: AC
  Filled 2015-06-01: qty 3

## 2015-06-01 MED ORDER — ONDANSETRON HCL 4 MG/2ML IJ SOLN
INTRAMUSCULAR | Status: AC
  Filled 2015-06-01: qty 2

## 2015-06-01 NOTE — Interval H&P Note (Signed)
History and Physical Interval Note:  06/01/2015 1:09 PM  [name]  has presented today for surgery, with the diagnosis of GERD, dysphagia, abd pain, constipation, nausea, vomiting  The various methods of treatment have been discussed with the patient and family. After consideration of risks, benefits and other options for treatment, the patient has consented to  Procedure(s) with comments: COLONOSCOPY (N/A) - 1315-moved to 1300 [place] to notify pt ESOPHAGOGASTRODUODENOSCOPY (EGD) (N/A) as a surgical intervention .  The patient's history has been reviewed, patient examined, no change in status, stable for surgery.  I have reviewed the patient's chart and labs.  Questions were answered to the patient's satisfaction.     Manus Rudd

## 2015-06-01 NOTE — Discharge Instructions (Signed)
Colonoscopy Discharge Instructions  Read the instructions outlined below and refer to this sheet in the next few weeks. These discharge instructions provide you with general information on caring for yourself after you leave the hospital. Your doctor may also give you specific instructions. While your treatment has been planned according to the most current medical practices available, unavoidable complications occasionally occur. If you have any problems or questions after discharge, call Dr. Gala Romney at [phone]. ACTIVITY  You may resume your regular activity, but move at a slower pace for the next 24 hours.   Take frequent rest periods for the next 24 hours.   Walking will help get rid of the air and reduce the bloated feeling in your belly (abdomen).   No driving for 24 hours (because of the medicine (anesthesia) used during the test).    Do not sign any important legal documents or operate any machinery for 24 hours (because of the anesthesia used during the test).  NUTRITION  Drink plenty of fluids.   You may resume your normal diet as instructed by your doctor.   Begin with a light meal and progress to your normal diet. Heavy or fried foods are harder to digest and may make you feel sick to your stomach (nauseated).   Avoid alcoholic beverages for 24 hours or as instructed.  MEDICATIONS  You may resume your normal medications unless your doctor tells you otherwise.  WHAT YOU CAN EXPECT TODAY  Some feelings of bloating in the abdomen.   Passage of more gas than usual.   Spotting of blood in your stool or on the toilet paper.  IF YOU HAD POLYPS REMOVED DURING THE COLONOSCOPY:  No aspirin products for 7 days or as instructed.   No alcohol for 7 days or as instructed.   Eat a soft diet for the next 24 hours.  FINDING OUT THE RESULTS OF YOUR TEST Not all test results are available during your visit. If your test results are not back during the visit, make an appointment  with your caregiver to find out the results. Do not assume everything is normal if you have not heard from your caregiver or the medical facility. It is important for you to follow up on all of your test results.  SEEK IMMEDIATE MEDICAL ATTENTION IF:  You have more than a spotting of blood in your stool.   Your belly is swollen (abdominal distention).   You are nauseated or vomiting.   You have a temperature over 101.   You have abdominal pain or discomfort that is severe or gets worse throughout the day.   EGD Discharge instructions Please read the instructions outlined below and refer to this sheet in the next few weeks. These discharge instructions provide you with general information on caring for yourself after you leave the hospital. Your doctor may also give you specific instructions. While your treatment has been planned according to the most current medical practices available, unavoidable complications occasionally occur. If you have any problems or questions after discharge, please call your doctor. ACTIVITY  You may resume your regular activity but move at a slower pace for the next 24 hours.   Take frequent rest periods for the next 24 hours.   Walking will help expel (get rid of) the air and reduce the bloated feeling in your abdomen.   No driving for 24 hours (because of the anesthesia (medicine) used during the test).   You may shower.   Do not sign any  important legal documents or operate any machinery for 24 hours (because of the anesthesia used during the test).  NUTRITION  Drink plenty of fluids.   You may resume your normal diet.   Begin with a light meal and progress to your normal diet.   Avoid alcoholic beverages for 24 hours or as instructed by your caregiver.  MEDICATIONS  You may resume your normal medications unless your caregiver tells you otherwise.  WHAT YOU CAN EXPECT TODAY  You may experience abdominal discomfort such as a feeling of  fullness or gas pains.  FOLLOW-UP  Your doctor will discuss the results of your test with you.  SEEK IMMEDIATE MEDICAL ATTENTION IF ANY OF THE FOLLOWING OCCUR:  Excessive nausea (feeling sick to your stomach) and/or vomiting.   Severe abdominal pain and distention (swelling).   Trouble swallowing.   Temperature over 101 F (37.8 C).   Rectal bleeding or vomiting of blood.    GERD and polyp information provided  Continue Protonix 40 mg daily  Further recommendations to follow pending review of pathology report   *******FOLLOW UP WITH DR. FANTA REGARDING YOUR BLOOD SUGARS AS SOON AS POSSIBLE **********    Gastroesophageal Reflux Disease, Adult Gastroesophageal reflux disease (GERD) happens when acid from your stomach flows up into the esophagus. When acid comes in contact with the esophagus, the acid causes soreness (inflammation) in the esophagus. Over time, GERD may create small holes (ulcers) in the lining of the esophagus. CAUSES   Increased body weight. This puts pressure on the stomach, making acid rise from the stomach into the esophagus.  Smoking. This increases acid production in the stomach.  Drinking alcohol. This causes decreased pressure in the lower esophageal sphincter (valve or ring of muscle between the esophagus and stomach), allowing acid from the stomach into the esophagus.  Late evening meals and a full stomach. This increases pressure and acid production in the stomach.  A malformed lower esophageal sphincter. Sometimes, no cause is found. SYMPTOMS   Burning pain in the lower part of the mid-chest behind the breastbone and in the mid-stomach area. This may occur twice a week or more often.  Trouble swallowing.  Sore throat.  Dry cough.  Asthma-like symptoms including chest tightness, shortness of breath, or wheezing. DIAGNOSIS  Your caregiver may be able to diagnose GERD based on your symptoms. In some cases, X-rays and other tests may be  done to check for complications or to check the condition of your stomach and esophagus. TREATMENT  Your caregiver may recommend over-the-counter or prescription medicines to help decrease acid production. Ask your caregiver before starting or adding any new medicines.  HOME CARE INSTRUCTIONS   Change the factors that you can control. Ask your caregiver for guidance concerning weight loss, quitting smoking, and alcohol consumption.  Avoid foods and drinks that make your symptoms worse, such as:  Caffeine or alcoholic drinks.  Chocolate.  Peppermint or mint flavorings.  Garlic and onions.  Spicy foods.  Citrus fruits, such as oranges, lemons, or limes.  Tomato-based foods such as sauce, chili, salsa, and pizza.  Fried and fatty foods.  Avoid lying down for the 3 hours prior to your bedtime or prior to taking a nap.  Eat small, frequent meals instead of large meals.  Wear loose-fitting clothing. Do not wear anything tight around your waist that causes pressure on your stomach.  Raise the head of your bed 6 to 8 inches with wood blocks to help you sleep. Extra pillows will  not help.  Only take over-the-counter or prescription medicines for pain, discomfort, or fever as directed by your caregiver.  Do not take aspirin, ibuprofen, or other nonsteroidal anti-inflammatory drugs (NSAIDs). SEEK IMMEDIATE MEDICAL CARE IF:   You have pain in your arms, neck, jaw, teeth, or back.  Your pain increases or changes in intensity or duration.  You develop nausea, vomiting, or sweating (diaphoresis).  You develop shortness of breath, or you faint.  Your vomit is green, yellow, black, or looks like coffee grounds or blood.  Your stool is red, bloody, or black. These symptoms could be signs of other problems, such as heart disease, gastric bleeding, or esophageal bleeding. MAKE SURE YOU:   Understand these instructions.  Will watch your condition.  Will get help right away if you  are not doing well or get worse. Document Released: 06/18/2005 Document Revised: 12/01/2011 Document Reviewed: 03/28/2011 [facility] Patient Information 2015 [place], [place]. This information is not intended to replace advice given to you by your health care provider. Make sure you discuss any questions you have with your health care provider.   Colon Polyps Polyps are lumps of extra tissue growing inside the body. Polyps can grow in the large intestine (colon). Most colon polyps are noncancerous (benign). However, some colon polyps can become cancerous over time. Polyps that are larger than a pea may be harmful. To be safe, caregivers remove and test all polyps. CAUSES  Polyps form when mutations in the genes cause your cells to grow and divide even though no more tissue is needed. RISK FACTORS There are a number of risk factors that can increase your chances of getting colon polyps. They include:  Being older than 50 years.  Family history of colon polyps or colon cancer.  Long-term colon diseases, such as colitis or Crohn disease.  Being overweight.  Smoking.  Being inactive.  Drinking too much alcohol. SYMPTOMS  Most small polyps do not cause symptoms. If symptoms are present, they may include:  Blood in the stool. The stool may look dark red or black.  Constipation or diarrhea that lasts longer than 1 week. DIAGNOSIS People often do not know they have polyps until their caregiver finds them during a regular checkup. Your caregiver can use 4 tests to check for polyps:  Digital rectal exam. The caregiver wears gloves and feels inside the rectum. This test would find polyps only in the rectum.  Barium enema. The caregiver puts a liquid called barium into your rectum before taking X-rays of your colon. Barium makes your colon look white. Polyps are dark, so they are easy to see in the X-ray pictures.  Sigmoidoscopy. A thin, flexible tube (sigmoidoscope) is placed into your  rectum. The sigmoidoscope has a light and tiny camera in it. The caregiver uses the sigmoidoscope to look at the last third of your colon.  Colonoscopy. This test is like sigmoidoscopy, but the caregiver looks at the entire colon. This is the most common method for finding and removing polyps. TREATMENT  Any polyps will be removed during a sigmoidoscopy or colonoscopy. The polyps are then tested for cancer. PREVENTION  To help lower your risk of getting more colon polyps:  Eat plenty of fruits and vegetables. Avoid eating fatty foods.  Do not smoke.  Avoid drinking alcohol.  Exercise every day.  Lose weight if recommended by your caregiver.  Eat plenty of calcium and folate. Foods that are rich in calcium include milk, cheese, and broccoli. Foods that are rich in  folate include chickpeas, kidney beans, and spinach. HOME CARE INSTRUCTIONS Keep all follow-up appointments as directed by your caregiver. You may need periodic exams to check for polyps. SEEK MEDICAL CARE IF: You notice bleeding during a bowel movement. Document Released: 06/04/2004 Document Revised: 12/01/2011 Document Reviewed: 11/18/2011 [facility] Patient Information 2015 [place], [place]. This information is not intended to replace advice given to you by your health care provider. Make sure you discuss any questions you have with your health care provider.

## 2015-06-01 NOTE — Op Note (Signed)
[facility] [address] [place], [address]   COLONOSCOPY PROCEDURE REPORT  PATIENT: [name], [name]  MR#: [id number] BIRTHDATE: 08-Feb-1961 , 62  yrs. old GENDER: female ENDOSCOPIST: R.  Garfield Cornea, MD FACP [facility] REFERRED VV:[id number] Legrand Rams, M.D. PROCEDURE DATE:  06-04-15 PROCEDURE:   Colonoscopy with biopsy INDICATIONS:First ever average risk screening examination. MEDICATIONS: Versed 4 mg IV and Demerol 75 mg IV in divided doses. Phenergan 25 mg IV.  Zofran 4 mg IV ASA CLASS:       Class II  CONSENT: The risks, benefits, alternatives and imponderables including but not limited to bleeding, perforation as well as the possibility of a missed lesion have been reviewed.  The potential for biopsy, lesion removal, etc. have also been discussed. Questions have been answered.  All parties agreeable.  Please see the history and physical in the medical record for more information.  DESCRIPTION OF PROCEDURE:   After the risks benefits and alternatives of the procedure were thoroughly explained, informed consent was obtained.  The digital rectal exam revealed no abnormalities of the rectum.   The EG-[id number] ([id number])  endoscope was introduced through the anus and advanced to the cecum, which was identified by both the appendix and ileocecal valve. No adverse events experienced.   The quality of the prep was adequate  The instrument was then slowly withdrawn as the colon was fully examined. Estimated blood loss is zero unless otherwise noted in this procedure report.      COLON FINDINGS: Internal hemorrhoids; (3) diminutive rectosigmoid polyps present. ; otherwise, the remainder of the colonic mucosa appeared normal.  Retroflexion was performed. .  Withdrawal time=13 minutes 0 seconds.  The scope was withdrawn and the procedure completed. COMPLICATIONS: There were no immediate complications. EBL 3 mL ENDOSCOPIC IMPRESSION: Diminutive rectosigmoid  polyps?"removed as described above; Otherwise, normal colonoscopy  RECOMMENDATIONS: Follow up on pathology. See EGD report.  eSigned:  R. Garfield Cornea, MD Rosalita [facility]ss[facility] June 04, 2015 2:15 PM   cc:  CPT CODES: ICD CODES:  The ICD and CPT codes recommended by this software are interpretations from the data that the clinical staff has captured with the software.  The verification of the translation of this report to the ICD and CPT codes and modifiers is the sole responsibility of the health care institution and practicing physician where this report was generated.  [employer] will not be held responsible for the validity of the ICD and CPT codes included on this report.  AMA assumes no liability for data contained or not contained herein. CPT is a Designer, television/film set of the [employer].

## 2015-06-01 NOTE — H&P (View-Only) (Signed)
Primary Care Physician:  FANTA,TESFAYE, MD  Primary Gastroenterologist: Michael Rourk, MD    Chief Complaint  Patient presents with  . Colonoscopy  . Gastrophageal Reflux    HPI:  [name] is a 53 y.o. female here to schedule colonoscopy and for f/u GERD. She has history of ulcerative reflux esophagitis and candida esophagitis with hospitalization in 02/2014 for DKA and hematemesis. EGD at that time. No prior colonoscopy. Patient with history of RA.   Complains of daily nausea with intermittent vomiting. Has been on antiemetics for years. Taking phenergan 3 times a day. Has been on nexium for years. No other PPI. Better when on BID but reports she was changed to once a day after her hospitalization last year. She complains of vague odynophagia, esophageal dysphagia to everything. Usually constipation, BM every other day. No melena, brbpr. Recently on pain meds due to right ankle and left knee fracture. Weaning off now.   .    Current Outpatient Prescriptions  Medication Sig Dispense Refill  . albuterol (PROVENTIL HFA;VENTOLIN HFA) 108 (90 BASE) MCG/ACT inhaler Inhale 2 puffs into the lungs every 6 (six) hours as needed. For shortness of breath    . escitalopram (LEXAPRO) 10 MG tablet Take 10 mg by mouth at bedtime.     . fluticasone (FLONASE) 50 MCG/ACT nasal spray Place 2 sprays into both nostrils daily as needed for allergies or rhinitis.    . gabapentin (NEURONTIN) 100 MG capsule Take 200 mg by mouth 4 (four) times daily.     . Insulin Glargine (LANTUS SOLOSTAR) 100 UNIT/ML Solostar Pen Inject 30 Units into the skin at bedtime.     . insulin lispro (HUMALOG) 100 UNIT/ML injection Inject 5-10 Units into the skin 3 (three) times daily as needed for high blood sugar (Uses 3 timesdaily according to sliding scale).    . levothyroxine (SYNTHROID, LEVOTHROID) 137 MCG tablet Take 1 tablet (137 mcg total) by mouth every morning. For thyroid hormone replacement    . lisinopril  (PRINIVIL,ZESTRIL) 20 MG tablet Take 1 tablet (20 mg total) by mouth daily. 30 tablet 0  . NEXIUM 40 MG capsule Take 40 mg by mouth every morning.     . ondansetron (ZOFRAN ODT) 8 MG disintegrating tablet Take 1 tablet (8 mg total) by mouth every 8 (eight) hours as needed for nausea or vomiting. 20 tablet 0  . traMADol (ULTRAM) 50 MG tablet Take 50 mg by mouth 2 (two) times daily as needed for moderate pain.      No current facility-administered medications for this visit.    Allergies as of 05/16/2015 - Review Complete 05/16/2015  Allergen Reaction Noted  . Phenytoin Anaphylaxis 03/21/2010  . Sulfonamide derivatives Nausea And Vomiting 02/09/2012  . Tetracyclines & related Nausea And Vomiting 09/01/2011    Past Medical History  Diagnosis Date  . Hypothyroid   . Anxiety   . COPD (chronic obstructive pulmonary disease)   . GERD (gastroesophageal reflux disease)   . PUD (peptic ulcer disease)     ? remote past, no reports from [facility] or [facility], states possibly Dr. Rehman.   . Hiatal hernia   . Chronic abdominal pain   . Diabetes mellitus     type I  . Hyperlipidemia   . Stroke   . Polyneuropathy in diabetes   . Unspecified polyarthropathy or polyarthritis, site unspecified   . Hypertension     Past Surgical History  Procedure Laterality Date  . Abdominal hysterectomy    . Tonsillectomy    .   Appendectomy    . Bladder repair    . Esophagogastroduodenoscopy      in remote past, unclear who performed. No op notes from [facility] or [facility] (pt thought Dr. Rehman)  . Esophagogastroduodenoscopy  02/12/2012    Dr. Rourk:  Normal esophagus status post 52 F dilation. Small hiatal hernia. mild chronic gastritis  . Esophagogastroduodenoscopy N/A 02/21/2014    RMR:Severe exudative reflux esophagitis-and + Candida esophagitis.    Family History  Problem Relation Age of Onset  . Liver cancer Mother     remission for 2 years  . Colon cancer Neg Hx     Social History   Social History   . Marital Status: Divorced    Spouse Name: N/A  . Number of Children: N/A  . Years of Education: N/A   Occupational History  . Not on file.   Social History Main Topics  . Smoking status: Current Every Day Smoker -- 0.50 packs/day for 15 years    Types: Cigarettes  . Smokeless tobacco: Not on file  . Alcohol Use: No  . Drug Use: Yes    Special: Cocaine     Comment: none since 2013.  . Sexual Activity: Not on file   Other Topics Concern  . Not on file   Social History Narrative      ROS:  General: Negative for anorexia, weight loss, fever, chills, fatigue, weakness. Eyes: Negative for vision changes.  ENT: Negative for hoarseness, difficulty swallowing , nasal congestion. CV: Negative for chest pain, angina, palpitations, dyspnea on exertion, peripheral edema.  Respiratory: Negative for dyspnea at rest, dyspnea on exertion, cough, sputum, wheezing.  GI: See history of present illness. GU:  Negative for dysuria, hematuria, urinary incontinence, urinary frequency, nocturnal urination.  MS: +joint pain due to fractures. No low back pain.  Derm: Negative for rash or itching.  Neuro: Negative for weakness, abnormal sensation, seizure, frequent headaches, memory loss, confusion.  Psych: Negative for anxiety, depression, suicidal ideation, hallucinations.  Endo: Negative for unusual weight change.  Heme: Negative for bruising or bleeding. Allergy: Negative for rash or hives.    Physical Examination:  BP 94/62 mmHg  Pulse 89  Temp(Src) 97.6 F (36.4 C) (Oral)  Ht 5' 4" (1.626 m)  Wt 159 lb 6.4 oz (72.303 kg)  BMI 27.35 kg/m2   General: Well-nourished, well-developed in no acute distress.  Head: Normocephalic, atraumatic.   Eyes: Conjunctiva pink, no icterus. Mouth: Oropharyngeal mucosa moist and pink , no lesions erythema or exudate. Neck: Supple without thyromegaly, masses, or lymphadenopathy.  Lungs: Clear to auscultation bilaterally.  Heart: Regular rate and  rhythm, no murmurs rubs or gallops.  Abdomen: Bowel sounds are normal, mild diffuse tenderness, nondistended, no hepatosplenomegaly or masses, no abdominal bruits or    hernia , no rebound or guarding.   Rectal: not performed Extremities: No lower extremity edema. No clubbing or deformities.  Neuro: Alert and oriented x 4 , grossly normal neurologically.  Skin: Warm and dry, no rash or jaundice.   Psych: Alert and cooperative, normal mood and affect.  Labs: Lab Results  Component Value Date   WBC 7.2 04/12/2015   HGB 13.5 04/12/2015   HCT 40.5 04/12/2015   MCV 93.1 04/12/2015   PLT 273 04/12/2015   Lab Results  Component Value Date   CREATININE 0.87 04/12/2015   BUN 19 04/12/2015   NA 133* 04/12/2015   K 4.3 04/12/2015   CL 99* 04/12/2015   CO2 25 04/12/2015   Lab Results    Component Value Date   ALT 5 02/22/2014   AST 12 02/22/2014   ALKPHOS 98 02/22/2014   BILITOT 0.4 02/22/2014     Imaging Studies: Mm Screening Breast Tomo Bilateral  04/30/2015   CLINICAL DATA:  Screening. Baseline.  EXAM: DIGITAL SCREENING BILATERAL MAMMOGRAM WITH 3D TOMO WITH CAD  COMPARISON:  None  ACR Breast Density Category b: There are scattered areas of fibroglandular density.  FINDINGS: In the right breast multiple small groups of calcifications require further evaluation.  In the left breast a possible mass in the upper-outer quadrant requires further evaluation.  Images were processed with CAD.  IMPRESSION: Further evaluation is suggested for the groups of calcifications in the right breast.  Further evaluation is suggested for possible mass in the left breast.  RECOMMENDATION: Diagnostic mammogram of both breasts with possible ultrasound of the left breast. (Code:[id number])  The patient will be contacted regarding the findings, and additional imaging will be scheduled.  BI-RADS CATEGORY  0: Incomplete. Need additional imaging evaluation and/or prior mammograms for comparison.   Electronically Signed    By: Michelle  Collins M.D.   On: 04/30/2015 16:54   Dg Knee Ap/lat W/sunrise Left  05/09/2015   3 views left knee  Patient with history of left tibial plateau fracture. Fell again.  Complaint a medial joint line pain. The fracture shows no significant  depression or angulation. The fracture appears to have healed nicely.  Impression healed tibial plateau fracture with no additional acute  findings     

## 2015-06-01 NOTE — Op Note (Signed)
[facility] [address] [place], [address]   ENDOSCOPY PROCEDURE REPORT  PATIENT: Elisabetta, Mishra  MR#: [id number] BIRTHDATE: 01-Nov-1960 , 76  yrs. old GENDER: female ENDOSCOPIST: R.  Garfield Cornea, MD FACP FACG REFERRED BY:  Conni Slipper, M.D. PROCEDURE DATE:  June 02, 2015 PROCEDURE:  EGD with Venia Minks dilation of esophagus INDICATIONS:  Long-standing GERD; esophageal dysphagia. MEDICATIONS: Versed 3 mg IV and Demerol 75 mg IV in divided doses. Phenergan 25 mg IV.  Zofran 4 mg IV.  Xylocaine gel orally ASA CLASS:      Class II  CONSENT: The risks, benefits, limitations, alternatives and imponderables have been discussed.  The potential for biopsy, esophogeal dilation, etc. have also been reviewed.  Questions have been answered.  All parties agreeable.  Please see the history and physical in the medical record for more information.  DESCRIPTION OF PROCEDURE: After the risks benefits and alternatives of the procedure were thoroughly explained, informed consent was obtained.  The EG-[id number] ([id number]) endoscope was introduced through the mouth and advanced to the second portion of the duodenum , limited by Without limitations. The instrument was slowly withdrawn as the mucosa was fully examined. Estimated blood loss is zero unless otherwise noted in this procedure report.    Normal-appearing, patent tubular esophagus.  Stomach empty.  Small hiatal hernia.  Normal-appearing gastric mucosa.  Patent pylorus. Normal-appearing first and second portion of the duodenum.  The scope was withdrawn and a 54 [place] Maloney dilator was passed to full insertion easily.  A look back revealed no apparent complication related to this maneuver.  Retroflexed views revealed no abnormalities and Retroflexed views revealed a hiatal hernia. The scope was then withdrawn from the patient and the procedure completed.  COMPLICATIONS: There were no immediate  complications.  ENDOSCOPIC IMPRESSION: Small hiatal hernia. Normal EGD?"status post passage of a Maloney dilator. Patient states since starting Protonix her reflux symptoms have been much better.  RECOMMENDATIONS: Continue Protonix 40 mg daily. See colonoscopy report.  REPEAT EXAM:  eSigned:  R. Garfield Cornea, MD Rosalita Chessman [facility] 06/02/2015 1:38 PM    CC:  CPT CODES: ICD CODES:  The ICD and CPT codes recommended by this software are interpretations from the data that the clinical staff has captured with the software.  The verification of the translation of this report to the ICD and CPT codes and modifiers is the sole responsibility of the health care institution and practicing physician where this report was generated.  [employer] will not be held responsible for the validity of the ICD and CPT codes included on this report.  AMA assumes no liability for data contained or not contained herein. CPT is a Designer, television/film set of the [employer].  PATIENT NAME:  Camren, Henthorn MR#: [id number]

## 2015-06-05 ENCOUNTER — Telehealth: Payer: Self-pay

## 2015-06-05 NOTE — Telephone Encounter (Signed)
Pt called and states that she had a procedure with RMR on 06/01/2015 and that she is wanting to know the results of her pathology. States she doesn't want [place] to mail anything to her address about her results  Pt can be reached at [phone]

## 2015-06-06 ENCOUNTER — Encounter (HOSPITAL_COMMUNITY): Payer: Self-pay | Admitting: Internal Medicine

## 2015-06-07 NOTE — Telephone Encounter (Signed)
Tried to call pt with results. No answer. LM on personal voicemail with results from bx.

## 2015-06-19 ENCOUNTER — Telehealth: Payer: Self-pay | Admitting: Internal Medicine

## 2015-06-19 NOTE — Telephone Encounter (Signed)
She needs further evaluation of her symptoms. Agree with stopping the Linzess if she is now having diarrhea. It would be best to get her in to reassess her current symptoms.  She needs to be brought in to see an extender in next available appointment slot

## 2015-06-19 NOTE — Telephone Encounter (Signed)
[phone]   PLEASE CALL PATIENT SHE HAD TCS 2 WEEKS AGO AND HAS BEEN SICK AND HURTING EVER SINCE.

## 2015-06-19 NOTE — Telephone Encounter (Signed)
Spoke with the pt- she is having nausea and the zofran is not helping. She is having reflux problems- burning in her stomach- the protonix is no longer working. She is having diarrhea, she is not taking the linzess at this time.  No fever, no blood in her stool. She is having upper right abd pain.   She wants to know if there is anything else she can try to help with her symptoms.

## 2015-06-19 NOTE — Telephone Encounter (Signed)
Made pt appt for 06/21/15 at 2:30 with LSL. Tried to call pt- NA- LMOM with appointment information. Asked her to call back if she could not make it to this appt.

## 2015-06-21 ENCOUNTER — Ambulatory Visit: Payer: Medicare Other | Admitting: Gastroenterology

## 2015-06-21 ENCOUNTER — Other Ambulatory Visit: Payer: Self-pay | Admitting: Internal Medicine

## 2015-07-11 ENCOUNTER — Telehealth: Payer: Self-pay | Admitting: Gastroenterology

## 2015-07-11 ENCOUNTER — Encounter: Payer: Self-pay | Admitting: Gastroenterology

## 2015-07-11 ENCOUNTER — Ambulatory Visit: Payer: Self-pay | Admitting: Gastroenterology

## 2015-07-11 NOTE — Telephone Encounter (Signed)
PATIENT WAS A NO SHOW AND LETTER SENT  °

## 2015-07-17 DIAGNOSIS — F419 Anxiety disorder, unspecified: Secondary | ICD-10-CM | POA: Diagnosis not present

## 2015-07-17 DIAGNOSIS — E039 Hypothyroidism, unspecified: Secondary | ICD-10-CM | POA: Diagnosis not present

## 2015-07-17 DIAGNOSIS — F17200 Nicotine dependence, unspecified, uncomplicated: Secondary | ICD-10-CM | POA: Diagnosis not present

## 2015-07-17 DIAGNOSIS — J449 Chronic obstructive pulmonary disease, unspecified: Secondary | ICD-10-CM | POA: Diagnosis not present

## 2015-07-17 DIAGNOSIS — R5381 Other malaise: Secondary | ICD-10-CM | POA: Diagnosis not present

## 2015-07-17 DIAGNOSIS — K219 Gastro-esophageal reflux disease without esophagitis: Secondary | ICD-10-CM | POA: Diagnosis not present

## 2015-07-17 DIAGNOSIS — E1065 Type 1 diabetes mellitus with hyperglycemia: Secondary | ICD-10-CM | POA: Diagnosis not present

## 2015-07-17 DIAGNOSIS — E1140 Type 2 diabetes mellitus with diabetic neuropathy, unspecified: Secondary | ICD-10-CM | POA: Diagnosis not present

## 2015-07-17 DIAGNOSIS — Z23 Encounter for immunization: Secondary | ICD-10-CM | POA: Diagnosis not present

## 2015-07-17 DIAGNOSIS — E785 Hyperlipidemia, unspecified: Secondary | ICD-10-CM | POA: Diagnosis not present

## 2015-11-07 ENCOUNTER — Emergency Department (HOSPITAL_COMMUNITY): Payer: Medicare Other

## 2015-11-07 ENCOUNTER — Inpatient Hospital Stay (HOSPITAL_COMMUNITY)
Admission: EM | Admit: 2015-11-07 | Discharge: 2015-11-13 | DRG: 638 | Disposition: A | Discharge status: Home / Self Care | Payer: Medicare Other | Attending: Internal Medicine | Admitting: Internal Medicine

## 2015-11-07 DIAGNOSIS — Z794 Long term (current) use of insulin: Secondary | ICD-10-CM | POA: Diagnosis not present

## 2015-11-07 DIAGNOSIS — Z800 Family history of malignant neoplasm of digestive organs: Secondary | ICD-10-CM

## 2015-11-07 DIAGNOSIS — Z833 Family history of diabetes mellitus: Secondary | ICD-10-CM | POA: Diagnosis not present

## 2015-11-07 DIAGNOSIS — E878 Other disorders of electrolyte and fluid balance, not elsewhere classified: Secondary | ICD-10-CM | POA: Diagnosis present

## 2015-11-07 DIAGNOSIS — F17210 Nicotine dependence, cigarettes, uncomplicated: Secondary | ICD-10-CM | POA: Diagnosis present

## 2015-11-07 DIAGNOSIS — R739 Hyperglycemia, unspecified: Secondary | ICD-10-CM | POA: Diagnosis not present

## 2015-11-07 DIAGNOSIS — F32A Depression, unspecified: Secondary | ICD-10-CM | POA: Diagnosis present

## 2015-11-07 DIAGNOSIS — E785 Hyperlipidemia, unspecified: Secondary | ICD-10-CM | POA: Diagnosis present

## 2015-11-07 DIAGNOSIS — R197 Diarrhea, unspecified: Secondary | ICD-10-CM

## 2015-11-07 DIAGNOSIS — E1169 Type 2 diabetes mellitus with other specified complication: Secondary | ICD-10-CM | POA: Diagnosis not present

## 2015-11-07 DIAGNOSIS — J09X2 Influenza due to identified novel influenza A virus with other respiratory manifestations: Secondary | ICD-10-CM | POA: Diagnosis present

## 2015-11-07 DIAGNOSIS — Z66 Do not resuscitate: Secondary | ICD-10-CM | POA: Diagnosis present

## 2015-11-07 DIAGNOSIS — E1010 Type 1 diabetes mellitus with ketoacidosis without coma: Secondary | ICD-10-CM | POA: Diagnosis not present

## 2015-11-07 DIAGNOSIS — K219 Gastro-esophageal reflux disease without esophagitis: Secondary | ICD-10-CM | POA: Diagnosis present

## 2015-11-07 DIAGNOSIS — F329 Major depressive disorder, single episode, unspecified: Secondary | ICD-10-CM | POA: Diagnosis present

## 2015-11-07 DIAGNOSIS — R1084 Generalized abdominal pain: Secondary | ICD-10-CM | POA: Diagnosis not present

## 2015-11-07 DIAGNOSIS — Z8673 Personal history of transient ischemic attack (TIA), and cerebral infarction without residual deficits: Secondary | ICD-10-CM | POA: Diagnosis not present

## 2015-11-07 DIAGNOSIS — E039 Hypothyroidism, unspecified: Secondary | ICD-10-CM | POA: Diagnosis present

## 2015-11-07 DIAGNOSIS — R112 Nausea with vomiting, unspecified: Secondary | ICD-10-CM | POA: Diagnosis present

## 2015-11-07 DIAGNOSIS — I10 Essential (primary) hypertension: Secondary | ICD-10-CM | POA: Diagnosis present

## 2015-11-07 DIAGNOSIS — J069 Acute upper respiratory infection, unspecified: Secondary | ICD-10-CM | POA: Diagnosis not present

## 2015-11-07 DIAGNOSIS — E860 Dehydration: Secondary | ICD-10-CM | POA: Diagnosis not present

## 2015-11-07 DIAGNOSIS — Z8249 Family history of ischemic heart disease and other diseases of the circulatory system: Secondary | ICD-10-CM

## 2015-11-07 DIAGNOSIS — E871 Hypo-osmolality and hyponatremia: Secondary | ICD-10-CM | POA: Diagnosis present

## 2015-11-07 DIAGNOSIS — J209 Acute bronchitis, unspecified: Secondary | ICD-10-CM | POA: Diagnosis present

## 2015-11-07 DIAGNOSIS — Z8711 Personal history of peptic ulcer disease: Secondary | ICD-10-CM | POA: Diagnosis not present

## 2015-11-07 DIAGNOSIS — E1310 Other specified diabetes mellitus with ketoacidosis without coma: Secondary | ICD-10-CM

## 2015-11-07 DIAGNOSIS — R7309 Other abnormal glucose: Secondary | ICD-10-CM | POA: Diagnosis not present

## 2015-11-07 DIAGNOSIS — E1042 Type 1 diabetes mellitus with diabetic polyneuropathy: Secondary | ICD-10-CM | POA: Diagnosis present

## 2015-11-07 DIAGNOSIS — J9809 Other diseases of bronchus, not elsewhere classified: Secondary | ICD-10-CM | POA: Diagnosis not present

## 2015-11-07 DIAGNOSIS — J09X1 Influenza due to identified novel influenza A virus with pneumonia: Secondary | ICD-10-CM | POA: Diagnosis not present

## 2015-11-07 DIAGNOSIS — J440 Chronic obstructive pulmonary disease with acute lower respiratory infection: Secondary | ICD-10-CM | POA: Diagnosis present

## 2015-11-07 DIAGNOSIS — E1110 Type 2 diabetes mellitus with ketoacidosis without coma: Secondary | ICD-10-CM | POA: Diagnosis present

## 2015-11-07 LAB — CBC WITH DIFFERENTIAL/PLATELET
BASOS PCT: 0 %
Basophils Absolute: 0 10*3/uL (ref 0.0–0.1)
EOS ABS: 0 10*3/uL (ref 0.0–0.7)
EOS PCT: 0 %
HCT: 44.3 % (ref 36.0–46.0)
Hemoglobin: 14.6 g/dL (ref 12.0–15.0)
LYMPHS ABS: 1.4 10*3/uL (ref 0.7–4.0)
Lymphocytes Relative: 18 %
MCH: 30.3 pg (ref 26.0–34.0)
MCHC: 33 g/dL (ref 30.0–36.0)
MCV: 91.9 fL (ref 78.0–100.0)
MONOS PCT: 10 %
Monocytes Absolute: 0.8 10*3/uL (ref 0.1–1.0)
NEUTROS PCT: 72 %
Neutro Abs: 5.4 10*3/uL (ref 1.7–7.7)
PLATELETS: 228 10*3/uL (ref 150–400)
RBC: 4.82 MIL/uL (ref 3.87–5.11)
RDW: 13.5 % (ref 11.5–15.5)
WBC: 7.6 10*3/uL (ref 4.0–10.5)

## 2015-11-07 LAB — I-STAT CHEM 8, ED
BUN: 14 mg/dL (ref 6–20)
CHLORIDE: 99 mmol/L — AB (ref 101–111)
CREATININE: 0.7 mg/dL (ref 0.44–1.00)
Calcium, Ion: 1.02 mmol/L — ABNORMAL LOW (ref 1.12–1.23)
Glucose, Bld: 367 mg/dL — ABNORMAL HIGH (ref 65–99)
HEMATOCRIT: 50 % — AB (ref 36.0–46.0)
HEMOGLOBIN: 17 g/dL — AB (ref 12.0–15.0)
POTASSIUM: 4.4 mmol/L (ref 3.5–5.1)
Sodium: 132 mmol/L — ABNORMAL LOW (ref 135–145)
TCO2: 18 mmol/L (ref 0–100)

## 2015-11-07 LAB — COMPREHENSIVE METABOLIC PANEL
ALBUMIN: 4.3 g/dL (ref 3.5–5.0)
ALT: 15 U/L (ref 14–54)
ANION GAP: 17 — AB (ref 5–15)
AST: 24 U/L (ref 15–41)
Alkaline Phosphatase: 110 U/L (ref 38–126)
BUN: 14 mg/dL (ref 6–20)
CALCIUM: 8.8 mg/dL — AB (ref 8.9–10.3)
CHLORIDE: 95 mmol/L — AB (ref 101–111)
CO2: 19 mmol/L — AB (ref 22–32)
Creatinine, Ser: 0.91 mg/dL (ref 0.44–1.00)
GFR calc non Af Amer: >60 mL/min (ref >60)
GLUCOSE: 368 mg/dL — AB (ref 65–99)
POTASSIUM: 4.2 mmol/L (ref 3.5–5.1)
SODIUM: 131 mmol/L — AB (ref 135–145)
Total Bilirubin: 0.7 mg/dL (ref 0.3–1.2)
Total Protein: 8.2 g/dL — ABNORMAL HIGH (ref 6.5–8.1)

## 2015-11-07 LAB — CBG MONITORING, ED: Glucose-Capillary: 341 mg/dL — ABNORMAL HIGH (ref 65–99)

## 2015-11-07 MED ORDER — INSULIN REGULAR HUMAN 100 UNIT/ML IJ SOLN
INTRAMUSCULAR | Status: DC
  Administered 2015-11-08: 2.2 [IU]/h via INTRAVENOUS
  Filled 2015-11-07: qty 2.5

## 2015-11-07 MED ORDER — SODIUM CHLORIDE 0.9 % IV BOLUS (SEPSIS)
1000.0000 mL | Freq: Once | INTRAVENOUS | Status: AC
  Administered 2015-11-07: 1000 mL via INTRAVENOUS

## 2015-11-07 MED ORDER — SODIUM CHLORIDE 0.9 % IV SOLN
INTRAVENOUS | Status: AC
  Filled 2015-11-07: qty 2.5

## 2015-11-07 MED ORDER — ONDANSETRON HCL 4 MG/2ML IJ SOLN
4.0000 mg | Freq: Once | INTRAMUSCULAR | Status: AC
  Administered 2015-11-07: 4 mg via INTRAVENOUS
  Filled 2015-11-07: qty 2

## 2015-11-07 MED ORDER — HYDROMORPHONE HCL 1 MG/ML IJ SOLN
0.5000 mg | Freq: Once | INTRAMUSCULAR | Status: AC
  Administered 2015-11-07: 0.5 mg via INTRAVENOUS
  Filled 2015-11-07: qty 1

## 2015-11-07 MED ORDER — ONDANSETRON 4 MG PO TBDP
4.0000 mg | ORAL_TABLET | Freq: Once | ORAL | Status: AC
  Administered 2015-11-07: 4 mg via ORAL
  Filled 2015-11-07: qty 1

## 2015-11-07 MED ORDER — INSULIN ASPART 100 UNIT/ML ~~LOC~~ SOLN
SUBCUTANEOUS | Status: AC
  Filled 2015-11-07: qty 1

## 2015-11-07 MED ORDER — DEXTROSE-NACL 5-0.45 % IV SOLN: INTRAVENOUS | Status: DC

## 2015-11-07 NOTE — ED Notes (Signed)
Pt initially reported to EMS that her blood sugar has been running high today. Pt also c/o nausea, vomiting, diarrhea, cough today.

## 2015-11-07 NOTE — ED Provider Notes (Signed)
CSN: [id number]     Arrival date & time 11/07/15  2000 History  By signing my name below, I, Rohini Rajnarayanan, attest that this documentation has been prepared under the direction and in the presence of Milton Ferguson, MD Electronically Signed: Evonnie Dawes, ED Scribe 11/07/2015 at 9:04 PM.      Chief Complaint  Patient presents with  . Hyperglycemia   HPI Comments: Pt complains of hyperglycemia x1 day, and n/v/d and cough x3 days.   Patient is a 55 y.o. female presenting with hyperglycemia. The history is provided by the patient. No language interpreter was used.  Hyperglycemia Severity:  Unable to specify Onset quality:  Gradual Duration:  1 day Timing:  Constant Progression:  Unchanged Diabetes status:  Controlled with insulin Associated symptoms: dehydration, fever, nausea and vomiting   Associated symptoms: no abdominal pain, no chest pain and no fatigue   Fever:    Duration:  3 days   Timing:  Constant   Temp source:  Subjective Vomiting:    Quality:  Unable to specify   Number of occurrences:  2   Duration:  3 days   Timing:  Constant   Progression:  Unchanged HPI Comments: [name] is a 55 y.o. female with a pmhx of COPD, PUD, HLD, HTN, and DM, brought in by ambulance, who presents to the Emergency Department complaining of hyperglycemia. Pt also c/o n/v/d and cough x3 days. Pt has received her flu shot this year.   A home cervical traction device is recommended; use 6-10 lbs qid for 15-20 minutes with heat prn neck pain.  Past Medical History  Diagnosis Date  . Hypothyroid   . Anxiety   . COPD (chronic obstructive pulmonary disease)   . GERD (gastroesophageal reflux disease)   . PUD (peptic ulcer disease)     ? remote past, no reports from [place] or [facility], states possibly Dr. Laural Golden.   . Hiatal hernia   . Chronic abdominal pain   . Diabetes mellitus     type I  . Hyperlipidemia   . Stroke   . Polyneuropathy in diabetes   . Unspecified  polyarthropathy or polyarthritis, site unspecified   . Hypertension   . Trichimoniasis   . Vaginal discharge 05/23/2015  . BV (bacterial vaginosis) 05/23/2015   Past Surgical History  Procedure Laterality Date  . Abdominal hysterectomy    . Tonsillectomy    . Appendectomy    . Bladder repair    . Esophagogastroduodenoscopy      in remote past, unclear who performed. No op notes from [facility] or [place] (pt thought Dr. Laural Golden)  . Esophagogastroduodenoscopy  02/12/2012    Dr. Gala Romney:  Normal esophagus status post 36 F dilation. Small hiatal hernia. mild chronic gastritis  . Esophagogastroduodenoscopy N/A 02/21/2014    [id number] exudative reflux esophagitis-and + Candida esophagitis.  . Colonoscopy N/A 06/01/2015    [id number] hemorrhoid diminutive rectosigmoid otherwise normal  . Esophagogastroduodenoscopy N/A 06/01/2015    [id number] HH otherwise normal   Family History  Problem Relation Age of Onset  . Liver cancer Mother     remission for 2 years  . Cancer Mother     lung  . Colon cancer Neg Hx   . Dementia Father   . Heart attack Father   . Other Sister     liver trouble  . Diabetes Brother   . Hyperthyroidism Brother   . Dementia Maternal Grandmother   . Diabetes Paternal Grandmother   . Diabetes Paternal Grandfather  Social History  Substance Use Topics  . Smoking status: Current Every Day Smoker -- 0.50 packs/day for 30 years    Types: Cigarettes  . Smokeless tobacco: Never Used  . Alcohol Use: No   OB History    Gravida Para Term Preterm AB TAB SAB Ectopic Multiple Living   1 1        1      Review of Systems  Constitutional: Positive for fever. Negative for appetite change and fatigue.  HENT: Negative for congestion, ear discharge and sinus pressure.   Eyes: Negative for discharge.  Respiratory: Positive for cough.   Cardiovascular: Negative for chest pain.  Gastrointestinal: Positive for nausea, vomiting and diarrhea. Negative for abdominal pain.   Genitourinary: Negative for frequency and hematuria.  Musculoskeletal: Negative for back pain.  Skin: Negative for rash.  Neurological: Negative for seizures and headaches.  Psychiatric/Behavioral: Negative for hallucinations.    Allergies  Phenytoin; Sulfonamide derivatives; and Tetracyclines & related  Home Medications   Prior to Admission medications   Medication Sig Start Date End Date Taking? Authorizing Provider  albuterol (PROVENTIL HFA;VENTOLIN HFA) 108 (90 BASE) MCG/ACT inhaler Inhale 2 puffs into the lungs every 6 (six) hours as needed. For shortness of breath 03/08/12   Encarnacion Slates, NP  escitalopram (LEXAPRO) 10 MG tablet Take 10 mg by mouth at bedtime.     Historical Provider, MD  fluconazole (DIFLUCAN) 150 MG tablet Take 1 tablet (150 mg total) by mouth once. 05/23/15   Estill Dooms, NP  fluticasone (FLONASE) 50 MCG/ACT nasal spray Place 2 sprays into both nostrils daily as needed for allergies or rhinitis.    Historical Provider, MD  gabapentin (NEURONTIN) 100 MG capsule Take 200 mg by mouth 4 (four) times daily.     Historical Provider, MD  Insulin Glargine (LANTUS SOLOSTAR) 100 UNIT/ML Solostar Pen Inject 30 Units into the skin at bedtime.     Historical Provider, MD  insulin lispro (HUMALOG) 100 UNIT/ML injection Inject 5-10 Units into the skin 3 (three) times daily as needed for high blood sugar (Uses 3 timesdaily according to sliding scale).    Historical Provider, MD  levothyroxine (SYNTHROID, LEVOTHROID) 137 MCG tablet Take 1 tablet (137 mcg total) by mouth every morning. For thyroid hormone replacement 03/08/12   Encarnacion Slates, NP  Linaclotide [facility]) 290 MCG CAPS capsule Take 1 capsule (290 mcg total) by mouth daily. 05/16/15   Mahala Menghini, PA-C  lisinopril (PRINIVIL,ZESTRIL) 20 MG tablet Take 1 tablet (20 mg total) by mouth daily. 02/27/14   Rosita Fire, MD  metroNIDAZOLE (FLAGYL) 500 MG tablet Take 1 tablet (500 mg total) by mouth 2 (two) times daily.  05/23/15   Estill Dooms, NP  ondansetron (ZOFRAN) 4 MG tablet TAKE ONE TABLET BY MOUTH EVERY FOUR HOURS AS NEEDED FOR NAUSEA OR VOMITING. 06/22/15   Orvil Feil, NP  pantoprazole (PROTONIX) 40 MG tablet Take 1 tablet (40 mg total) by mouth daily. 05/16/15   Mahala Menghini, PA-C  polyethylene glycol-electrolytes (NULYTELY/GOLYTELY) 420 G solution Take 4,000 mLs by mouth once. 05/16/15   Mahala Menghini, PA-C  traMADol (ULTRAM) 50 MG tablet Take 50 mg by mouth 2 (two) times daily as needed for moderate pain.  02/03/14   Historical Provider, MD   BP 131/65 mmHg  Pulse 98  Temp(Src) 98.1 F (36.7 C) (Oral)  Resp 18  Ht 5\' 4"  (1.626 m)  Wt 150 lb (68.04 kg)  BMI 25.73 kg/m2  SpO2 95%  Physical Exam  Constitutional: She is oriented to person, place, and time. She appears well-developed.  Dry mucous membranes, Mild dehydration.  HENT:  Head: Normocephalic.  Eyes: Conjunctivae and EOM are normal. No scleral icterus.  Neck: Neck supple. No thyromegaly present.  Cardiovascular: Normal rate and regular rhythm.  Exam reveals no gallop and no friction rub.   No murmur heard. Pulmonary/Chest: No stridor. She has no wheezes. She has no rales. She exhibits no tenderness.  Abdominal: She exhibits no distension. There is tenderness (Mild). There is no rebound.  Musculoskeletal: Normal range of motion. She exhibits no edema.  Lymphadenopathy:    She has no cervical adenopathy.  Neurological: She is oriented to person, place, and time. She exhibits normal muscle tone. Coordination normal.  Skin: No rash noted. No erythema.  Psychiatric: She has a normal mood and affect. Her behavior is normal.    ED Course  Procedures  DIAGNOSTIC STUDIES: Oxygen Saturation is 95% on RA, adequate by my interpretation.    COORDINATION OF CARE: 8:35 PM-Discussed treatment plan which includes blood work and CBG monitoring, and administering Zofran, with pt at bedside and pt agreed to plan.   Labs Review Labs  Reviewed  CBG MONITORING, ED - Abnormal; Notable for the following:    Glucose-Capillary 341 (*)    All other components within normal limits  CBC WITH DIFFERENTIAL/PLATELET  COMPREHENSIVE METABOLIC PANEL  I-STAT CHEM 8, ED    Imaging Review No results found. I have personally reviewed and evaluated these images and lab results as part of my medical decision-making.   EKG Interpretation None     CRITICAL CARE Performed by: Loie Jahr L Total critical care time 35 minutes Critical care time was exclusive of separately billable procedures and treating other patients. Critical care was necessary to treat or prevent imminent or life-threatening deterioration. Critical care was time spent personally by me on the following activities: development of treatment plan with patient and/or surrogate as well as nursing, discussions with consultants, evaluation of patient's response to treatment, examination of patient, obtaining history from patient or surrogate, ordering and performing treatments and interventions, ordering and review of laboratory studies, ordering and review of radiographic studies, pulse oximetry and re-evaluation of patient's condition.   MDM   Final diagnoses:  None    Patient mild DKA will be put on glucose stabilizer and admitted to stepdown  The chart was scribed for me under my direct supervision.  I personally performed the history, physical, and medical decision making and all procedures in the evaluation of this patient.Milton Ferguson, MD 11/07/15 [phone]

## 2015-11-08 ENCOUNTER — Encounter (HOSPITAL_COMMUNITY): Payer: Self-pay | Admitting: Family Medicine

## 2015-11-08 DIAGNOSIS — E039 Hypothyroidism, unspecified: Secondary | ICD-10-CM | POA: Diagnosis present

## 2015-11-08 DIAGNOSIS — F329 Major depressive disorder, single episode, unspecified: Secondary | ICD-10-CM

## 2015-11-08 DIAGNOSIS — R739 Hyperglycemia, unspecified: Secondary | ICD-10-CM | POA: Diagnosis present

## 2015-11-08 DIAGNOSIS — F32A Depression, unspecified: Secondary | ICD-10-CM | POA: Diagnosis present

## 2015-11-08 DIAGNOSIS — J069 Acute upper respiratory infection, unspecified: Secondary | ICD-10-CM | POA: Diagnosis present

## 2015-11-08 DIAGNOSIS — E1010 Type 1 diabetes mellitus with ketoacidosis without coma: Principal | ICD-10-CM

## 2015-11-08 DIAGNOSIS — R1084 Generalized abdominal pain: Secondary | ICD-10-CM

## 2015-11-08 DIAGNOSIS — R112 Nausea with vomiting, unspecified: Secondary | ICD-10-CM

## 2015-11-08 LAB — BASIC METABOLIC PANEL
ANION GAP: 11 (ref 5–15)
ANION GAP: 10 (ref 5–15)
Anion gap: 10 (ref 5–15)
BUN: 12 mg/dL (ref 6–20)
BUN: 10 mg/dL (ref 6–20)
BUN: 9 mg/dL (ref 6–20)
CALCIUM: 7.9 mg/dL — AB (ref 8.9–10.3)
CALCIUM: 8.3 mg/dL — AB (ref 8.9–10.3)
CHLORIDE: 104 mmol/L (ref 101–111)
CO2: 18 mmol/L — ABNORMAL LOW (ref 22–32)
CO2: 23 mmol/L (ref 22–32)
CO2: 22 mmol/L (ref 22–32)
CREATININE: 0.74 mg/dL (ref 0.44–1.00)
CREATININE: 0.77 mg/dL (ref 0.44–1.00)
CREATININE: 0.78 mg/dL (ref 0.44–1.00)
Calcium: 8.4 mg/dL — ABNORMAL LOW (ref 8.9–10.3)
Chloride: 105 mmol/L (ref 101–111)
Chloride: 104 mmol/L (ref 101–111)
GFR calc Af Amer: >60 mL/min (ref >60)
GFR calc non Af Amer: >60 mL/min (ref >60)
GFR calc non Af Amer: >60 mL/min (ref >60)
Glucose, Bld: 198 mg/dL — ABNORMAL HIGH (ref 65–99)
Glucose, Bld: 177 mg/dL — ABNORMAL HIGH (ref 65–99)
Glucose, Bld: 146 mg/dL — ABNORMAL HIGH (ref 65–99)
Potassium: 3.8 mmol/L (ref 3.5–5.1)
Potassium: 4.3 mmol/L (ref 3.5–5.1)
Potassium: 4 mmol/L (ref 3.5–5.1)
SODIUM: 134 mmol/L — AB (ref 135–145)
SODIUM: 137 mmol/L (ref 135–145)
SODIUM: 136 mmol/L (ref 135–145)

## 2015-11-08 LAB — GLUCOSE, CAPILLARY
GLUCOSE-CAPILLARY: 103 mg/dL — AB (ref 65–99)
Glucose-Capillary: 264 mg/dL — ABNORMAL HIGH (ref 65–99)

## 2015-11-08 LAB — CBG MONITORING, ED
GLUCOSE-CAPILLARY: 144 mg/dL — AB (ref 65–99)
GLUCOSE-CAPILLARY: 144 mg/dL — AB (ref 65–99)
GLUCOSE-CAPILLARY: 201 mg/dL — AB (ref 65–99)
GLUCOSE-CAPILLARY: 281 mg/dL — AB (ref 65–99)
Glucose-Capillary: 259 mg/dL — ABNORMAL HIGH (ref 65–99)
Glucose-Capillary: 124 mg/dL — ABNORMAL HIGH (ref 65–99)
Glucose-Capillary: 118 mg/dL — ABNORMAL HIGH (ref 65–99)
Glucose-Capillary: 135 mg/dL — ABNORMAL HIGH (ref 65–99)
Glucose-Capillary: 178 mg/dL — ABNORMAL HIGH (ref 65–99)
Glucose-Capillary: 187 mg/dL — ABNORMAL HIGH (ref 65–99)
Glucose-Capillary: 158 mg/dL — ABNORMAL HIGH (ref 65–99)
Glucose-Capillary: 122 mg/dL — ABNORMAL HIGH (ref 65–99)

## 2015-11-08 LAB — URINALYSIS, ROUTINE W REFLEX MICROSCOPIC
Bilirubin Urine: NEGATIVE
GLUCOSE, UA: 500 mg/dL — AB
HGB URINE DIPSTICK: NEGATIVE
Ketones, ur: >80 mg/dL — AB
Leukocytes, UA: NEGATIVE
Nitrite: NEGATIVE
Protein, ur: NEGATIVE mg/dL
SPECIFIC GRAVITY, URINE: 1.015 (ref 1.005–1.030)
pH: 5.5 (ref 5.0–8.0)

## 2015-11-08 LAB — ETHANOL: Alcohol, Ethyl (B): <5 mg/dL (ref <5)

## 2015-11-08 LAB — RAPID URINE DRUG SCREEN, HOSP PERFORMED
AMPHETAMINES: NOT DETECTED
BENZODIAZEPINES: NOT DETECTED
Barbiturates: NOT DETECTED
Cocaine: POSITIVE — AB
OPIATES: POSITIVE — AB
TETRAHYDROCANNABINOL: NOT DETECTED

## 2015-11-08 LAB — MAGNESIUM: Magnesium: 1.6 mg/dL — ABNORMAL LOW (ref 1.7–2.4)

## 2015-11-08 MED ORDER — PANTOPRAZOLE SODIUM 40 MG PO TBEC: 40.0000 mg | DELAYED_RELEASE_TABLET | Freq: Every day | ORAL | Status: DC

## 2015-11-08 MED ORDER — ALBUTEROL SULFATE (2.5 MG/3ML) 0.083% IN NEBU
2.5000 mg | INHALATION_SOLUTION | RESPIRATORY_TRACT | Status: DC | PRN
  Administered 2015-11-11: 2.5 mg via RESPIRATORY_TRACT

## 2015-11-08 MED ORDER — GABAPENTIN 100 MG PO CAPS
200.0000 mg | ORAL_CAPSULE | Freq: Four times a day (QID) | ORAL | Status: DC
  Administered 2015-11-08 – 2015-11-12 (×20): 200 mg via ORAL
  Filled 2015-11-08 (×20): qty 2

## 2015-11-08 MED ORDER — BENZONATATE 100 MG PO CAPS
100.0000 mg | ORAL_CAPSULE | Freq: Three times a day (TID) | ORAL | Status: DC
  Administered 2015-11-08 – 2015-11-13 (×14): 100 mg via ORAL
  Filled 2015-11-08 (×14): qty 1

## 2015-11-08 MED ORDER — GUAIFENESIN ER 600 MG PO TB12
600.0000 mg | ORAL_TABLET | Freq: Two times a day (BID) | ORAL | Status: DC | PRN
  Administered 2015-11-08 (×2): 600 mg via ORAL
  Filled 2015-11-08 (×2): qty 1

## 2015-11-08 MED ORDER — HYDROCODONE-ACETAMINOPHEN 5-325 MG PO TABS
1.0000 | ORAL_TABLET | ORAL | Status: DC | PRN
Start: 2015-11-08 — End: 2015-11-13
  Administered 2015-11-08 (×5): 2 via ORAL
  Administered 2015-11-09: 1 via ORAL
  Administered 2015-11-09 – 2015-11-13 (×19): 2 via ORAL
  Filled 2015-11-08 (×11): qty 2
  Filled 2015-11-08: qty 1
  Filled 2015-11-08 (×13): qty 2

## 2015-11-08 MED ORDER — LEVOTHYROXINE SODIUM 112 MCG PO TABS
137.0000 ug | ORAL_TABLET | Freq: Every day | ORAL | Status: DC
Start: 2015-11-08 — End: 2015-11-13
  Administered 2015-11-08 – 2015-11-13 (×6): 137 ug via ORAL
  Filled 2015-11-08 (×8): qty 1

## 2015-11-08 MED ORDER — INSULIN GLARGINE 100 UNIT/ML ~~LOC~~ SOLN
20.0000 [IU] | SUBCUTANEOUS | Status: DC
  Administered 2015-11-08 – 2015-11-10 (×3): 20 [IU] via SUBCUTANEOUS
  Filled 2015-11-08 (×5): qty 0.2

## 2015-11-08 MED ORDER — ALBUTEROL SULFATE HFA 108 (90 BASE) MCG/ACT IN AERS
2.0000 | INHALATION_SPRAY | RESPIRATORY_TRACT | Status: DC | PRN
  Filled 2015-11-08: qty 6.7

## 2015-11-08 MED ORDER — ONDANSETRON HCL 4 MG/2ML IJ SOLN
4.0000 mg | Freq: Four times a day (QID) | INTRAMUSCULAR | Status: DC | PRN
  Administered 2015-11-08 – 2015-11-13 (×17): 4 mg via INTRAVENOUS
  Filled 2015-11-08 (×17): qty 2

## 2015-11-08 MED ORDER — TRAZODONE HCL 50 MG PO TABS
200.0000 mg | ORAL_TABLET | Freq: Two times a day (BID) | ORAL | Status: DC
  Administered 2015-11-08 – 2015-11-12 (×10): 200 mg via ORAL
  Filled 2015-11-08 (×10): qty 4

## 2015-11-08 MED ORDER — DEXTROSE-NACL 5-0.45 % IV SOLN
INTRAVENOUS | Status: DC
  Administered 2015-11-08: 03:00:00 via INTRAVENOUS

## 2015-11-08 MED ORDER — POTASSIUM CHLORIDE CRYS ER 20 MEQ PO TBCR
EXTENDED_RELEASE_TABLET | ORAL | Status: AC
  Administered 2015-11-08: 40 meq via ORAL
  Filled 2015-11-08: qty 1

## 2015-11-08 MED ORDER — PANTOPRAZOLE SODIUM 40 MG IV SOLR
40.0000 mg | INTRAVENOUS | Status: DC
  Administered 2015-11-08 – 2015-11-12 (×5): 40 mg via INTRAVENOUS
  Filled 2015-11-08 (×5): qty 40

## 2015-11-08 MED ORDER — ENOXAPARIN SODIUM 40 MG/0.4ML ~~LOC~~ SOLN
40.0000 mg | SUBCUTANEOUS | Status: DC
  Administered 2015-11-08 – 2015-11-13 (×7): 40 mg via SUBCUTANEOUS
  Filled 2015-11-08 (×7): qty 0.4

## 2015-11-08 MED ORDER — LINACLOTIDE 145 MCG PO CAPS
290.0000 ug | ORAL_CAPSULE | Freq: Every day | ORAL | Status: DC | PRN
Start: 2015-11-08 — End: 2015-11-13
  Filled 2015-11-08: qty 1

## 2015-11-08 MED ORDER — SODIUM CHLORIDE 0.9 % IV SOLN: INTRAVENOUS | Status: DC

## 2015-11-08 MED ORDER — GUAIFENESIN ER 600 MG PO TB12
ORAL_TABLET | ORAL | Status: AC
  Filled 2015-11-08: qty 1

## 2015-11-08 MED ORDER — POTASSIUM CHLORIDE CRYS ER 20 MEQ PO TBCR
40.0000 meq | EXTENDED_RELEASE_TABLET | Freq: Two times a day (BID) | ORAL | Status: DC
  Administered 2015-11-08 – 2015-11-13 (×12): 40 meq via ORAL
  Filled 2015-11-08 (×12): qty 2

## 2015-11-08 MED ORDER — POTASSIUM CHLORIDE 10 MEQ/100ML IV SOLN: 10.0000 meq | INTRAVENOUS | Status: DC

## 2015-11-08 MED ORDER — INSULIN ASPART 100 UNIT/ML ~~LOC~~ SOLN
0.0000 [IU] | Freq: Three times a day (TID) | SUBCUTANEOUS | Status: DC
  Administered 2015-11-08 (×2): 2 [IU] via SUBCUTANEOUS
  Administered 2015-11-09: 3 [IU] via SUBCUTANEOUS
  Administered 2015-11-09: 8 [IU] via SUBCUTANEOUS
  Administered 2015-11-10: 5 [IU] via SUBCUTANEOUS
  Administered 2015-11-10: 8 [IU] via SUBCUTANEOUS
  Administered 2015-11-11: 2 [IU] via SUBCUTANEOUS
  Administered 2015-11-11: 11 [IU] via SUBCUTANEOUS
  Administered 2015-11-12: 3 [IU] via SUBCUTANEOUS
  Administered 2015-11-12: 8 [IU] via SUBCUTANEOUS
  Administered 2015-11-12: 15 [IU] via SUBCUTANEOUS
  Administered 2015-11-13: 8 [IU] via SUBCUTANEOUS

## 2015-11-08 MED ORDER — LISINOPRIL 10 MG PO TABS
20.0000 mg | ORAL_TABLET | Freq: Every day | ORAL | Status: DC
  Administered 2015-11-08 – 2015-11-11 (×3): 20 mg via ORAL
  Filled 2015-11-08 (×4): qty 2

## 2015-11-08 NOTE — H&P (Signed)
[facility] History and Physical  MARCEE LALLO [id number] DOB: October 10, 1960 DOA: 11/07/2015  Referring physician: ED physician PCP: Rosita Fire, MD  Specialists: Dr. Aline Brochure (ortho), Dr. Steva Ready (OBGYN)   Chief Complaint:  Abdominal pain, nausea, vomiting, elevated sugars  HPI: [name] is a 55 y.o. female with PMH of type 1 diabetes diagnosed at age 66, ongoing tobacco abuse, hypertension, and hypothyroidism who presents the ED with generalized abdominal pain, nausea, vomiting, and elevated blood sugars. Patient reports onset of low grade fever and chills approximately 4 days ago and associated with rhinorrhea, sore throat, and nonproductive cough. The following day, she developed nausea, vomiting and generalized abdominal pain. As symptoms progressed, patient noted her blood sugars to be unusually high, in the 300s, and she came into the ED for evaluation. She describes her abdominal pain as constant, achy in character, moderate in intensity, localized to the abdomen, exacerbated by oral intake, and with no identified alleviating factors. Patient reports her vomitus to be nonbloody. She denies chest pain, palpitations, headache, dysuria, or hematuria. There is been no long distance travel or known sick contacts. She reports consistent compliance with her insulin regimen and is able to describe the regimen in detail. She endorses a remote history of cocaine abuse but notes that she has abstained for more than a year. She endorses some recent worsening in her depression, treated between this to the death of her mother. She denies suicidal or homicidal ideation, and denies hallucinations.  In ED, patient was found to be afebrile, saturating well on room air, and with tachycardia to the low 100s. Radiographs of the chest and abdomen were obtained and negative for any acute processes. Chem panel was drawn and revealing of hyponatremia, hypochloremia, anion gap of 17, serum bicarbonate of  19, and serum glucose of 368. CBC is within the normal limits. Patient was bolused with 2 L of normal saline in the emergency department, given Zofran for nausea, Dilaudid for pain, and placed on insulin infusion. Patient remained hemodynamically stable in the emergency department, tachycardia resolving with fluids and sugars coming down. She'll be admitted for ongoing evaluation and management of mild DKA in a type I diabetic with history of poor glycemic control.  Where does patient live?   At home    Can patient participate in ADLs?  Yes      Review of Systems:   General: no sweats, weight change, poor appetite, or fatigue. Subjective fever, chills 2-3 days ago HEENT: no blurry vision or hearing changes. Sore throat, rhinorrhea x4 days Pulm: no dyspnea or wheeze. Non-productive cough x4 days CV: no chest pain or palpitations Abd: no constipation. Abd pain, N/V, diarrhea x3 days GU: no dysuria, hematuria, increased urinary frequency, or urgency  Ext: no leg edema Neuro: no focal weakness, numbness, or tingling, no vision change or hearing loss Skin: no rash, no wounds MSK: No muscle spasm, no deformity, no red, hot, or swollen joint Heme: No easy bruising or bleeding Travel history: No recent long distant travel    Allergy:  Allergies  Allergen Reactions  . Phenytoin Anaphylaxis    Reaction: SJS (STEVENS JOHNSON SYNDROME)  . Sulfonamide Derivatives Nausea And Vomiting  . Tetracyclines & Related Nausea And Vomiting    Past Medical History  Diagnosis Date  . Hypothyroid   . Anxiety   . COPD (chronic obstructive pulmonary disease) ([place])   . GERD (gastroesophageal reflux disease)   . PUD (peptic ulcer disease)     ? remote past, no  reports from [facility] or [facility], states possibly Dr. Laural Golden.   . Hiatal hernia   . Chronic abdominal pain   . Diabetes mellitus     type I  . Hyperlipidemia   . Stroke ([place])   . Polyneuropathy in diabetes ([place])   . Unspecified polyarthropathy or  polyarthritis, site unspecified   . Hypertension   . Trichimoniasis   . Vaginal discharge 05/23/2015  . BV (bacterial vaginosis) 05/23/2015    Past Surgical History  Procedure Laterality Date  . Abdominal hysterectomy    . Tonsillectomy    . Appendectomy    . Bladder repair    . Esophagogastroduodenoscopy      in remote past, unclear who performed. No op notes from [facility] or [facility] (pt thought Dr. Laural Golden)  . Esophagogastroduodenoscopy  02/12/2012    Dr. Gala Romney:  Normal esophagus status post 71 F dilation. Small hiatal hernia. mild chronic gastritis  . Esophagogastroduodenoscopy N/A 02/21/2014    [id number] exudative reflux esophagitis-and + Candida esophagitis.  . Colonoscopy N/A 06/01/2015    [id number] hemorrhoid diminutive rectosigmoid otherwise normal  . Esophagogastroduodenoscopy N/A 06/01/2015    [id number] HH otherwise normal    Social History:  reports that she has been smoking Cigarettes.  She has a 15 pack-year smoking history. She has never used smokeless tobacco. She reports that she does not drink alcohol or use illicit drugs.  Family History:  Family History  Problem Relation Age of Onset  . Liver cancer Mother     remission for 2 years  . Cancer Mother     lung  . Colon cancer Neg Hx   . Dementia Father   . Heart attack Father   . Other Sister     liver trouble  . Diabetes Brother   . Hyperthyroidism Brother   . Dementia Maternal Grandmother   . Diabetes Paternal Grandmother   . Diabetes Paternal Grandfather      Prior to Admission medications   Medication Sig Start Date End Date Taking? Authorizing Provider  albuterol (PROVENTIL HFA;VENTOLIN HFA) 108 (90 BASE) MCG/ACT inhaler Inhale 2 puffs into the lungs every 6 (six) hours as needed. For shortness of breath 03/08/12  Yes Encarnacion Slates, NP  fluticasone (FLONASE) 50 MCG/ACT nasal spray Place 2 sprays into both nostrils daily as needed for allergies or rhinitis.   Yes Historical Provider, MD  gabapentin  (NEURONTIN) 100 MG capsule Take 200 mg by mouth 4 (four) times daily.    Yes Historical Provider, MD  Insulin Glargine (LANTUS SOLOSTAR) 100 UNIT/ML Solostar Pen Inject 30 Units into the skin at bedtime.    Yes Historical Provider, MD  insulin lispro (HUMALOG) 100 UNIT/ML injection Inject 5-10 Units into the skin 3 (three) times daily as needed for high blood sugar (Uses 3 timesdaily according to sliding scale).   Yes Historical Provider, MD  levothyroxine (SYNTHROID, LEVOTHROID) 137 MCG tablet Take 1 tablet (137 mcg total) by mouth every morning. For thyroid hormone replacement 03/08/12  Yes Encarnacion Slates, NP  Linaclotide (LINZESS) 290 MCG CAPS capsule Take 1 capsule (290 mcg total) by mouth daily. Patient taking differently: Take 290 mcg by mouth daily as needed (for constipation).  05/16/15  Yes Mahala Menghini, PA-C  lisinopril (PRINIVIL,ZESTRIL) 20 MG tablet Take 1 tablet (20 mg total) by mouth daily. 02/27/14  Yes Rosita Fire, MD  omeprazole (PRILOSEC) 40 MG capsule Take 40 mg by mouth daily.   Yes Historical Provider, MD  ondansetron (ZOFRAN) 4 MG tablet TAKE ONE  TABLET BY MOUTH EVERY FOUR HOURS AS NEEDED FOR NAUSEA OR VOMITING. 06/22/15  Yes Orvil Feil, NP  traMADol (ULTRAM) 50 MG tablet Take 50 mg by mouth 2 (two) times daily as needed for moderate pain.  02/03/14  Yes Historical Provider, MD  traZODone (DESYREL) 100 MG tablet Take 200 mg by mouth 2 (two) times daily.   Yes Historical Provider, MD  pantoprazole (PROTONIX) 40 MG tablet Take 1 tablet (40 mg total) by mouth daily. Patient not taking: Reported on 11/07/2015 05/16/15   Mahala Menghini, PA-C    Physical Exam: Filed Vitals:   11/07/15 2004 11/07/15 2030 11/07/15 2330 11/08/15 0000  BP: 131/65 145/69 157/56 154/61  Pulse: 98 105 93 91  Temp: 98.1 F (36.7 C)     TempSrc: Oral     Resp: 18 16 16 18   Height: 5\' 4"  (1.626 m)     Weight: 68.04 kg (150 lb)     SpO2: 95% 97% 90% 92%   General: Not in acute distress HEENT:        Eyes: PERRL, EOMI, no scleral icterus or conjunctival pallor.       ENT: No discharge from the ears or nose, no pharyngeal ulcers, petechiae or exudate, dry oral mucosa.        Neck: No JVD, no bruit, no appreciable mass Heme: No cervical adenopathy, no pallor Cardiac: S1/S2, RRR, No murmurs, No gallops or rubs. Pulm: Good air movement bilaterally. No rales, wheezing, rhonchi or rubs. Abd: Soft, mild tenderness throughout, nondistended, no rebound pain or gaurding, no mass or organomegaly, BS present. Ext: No LE edema bilaterally. 2+DP/PT pulse bilaterally. Musculoskeletal: No gross deformity, no red, hot, swollen joints  Skin: No rashes or wounds on exposed surfaces  Neuro: Alert, oriented X3, cranial nerves II-XII grossly intac. No focal findings Psych: Patient is not overtly psychotic, appropriate mood and affect.  Labs on Admission:  Basic Metabolic Panel:  Recent Labs Lab 11/07/15 2155 11/07/15 2245  NA 131* 132*  K 4.2 4.4  CL 95* 99*  CO2 19*  --   GLUCOSE 368* 367*  BUN 14 14  CREATININE 0.91 0.70  CALCIUM 8.8*  --    Liver Function Tests:  Recent Labs Lab 11/07/15 2155  AST 24  ALT 15  ALKPHOS 110  BILITOT 0.7  PROT 8.2*  ALBUMIN 4.3   No results for input(s): LIPASE, AMYLASE in the last 168 hours. No results for input(s): AMMONIA in the last 168 hours. CBC:  Recent Labs Lab 11/07/15 2155 11/07/15 2245  WBC 7.6  --   NEUTROABS 5.4  --   HGB 14.6 17.0*  HCT 44.3 50.0*  MCV 91.9  --   PLT 228  --    Cardiac Enzymes: No results for input(s): CKTOTAL, CKMB, CKMBINDEX, TROPONINI in the last 168 hours.  BNP (last 3 results) No results for input(s): BNP in the last 8760 hours.  ProBNP (last 3 results) No results for input(s): PROBNP in the last 8760 hours.  CBG:  Recent Labs Lab 11/07/15 2007 11/08/15 0012 11/08/15 0113  GLUCAP 341* 281* 259*    Radiological Exams on Admission: Dg Abd Acute W/chest  11/07/2015  CLINICAL DATA:  55 year old  female with hyperglycemia, nausea, vomiting, diarrhea and dry cough as well as generalized weakness EXAM: DG ABDOMEN ACUTE W/ 1V CHEST COMPARISON:  Prior chest x-ray 05/29/2014 FINDINGS: There is no evidence of dilated bowel loops or free intraperitoneal air. No radiopaque calculi or other significant radiographic abnormality is  seen. Heart size and mediastinal contours are within normal limits. Both lungs are clear. IMPRESSION: Negative abdominal radiographs.  No acute cardiopulmonary disease. Electronically Signed   By: Jacqulynn Cadet M.D.   On: 11/07/2015 22:45    EKG:  Not done in ED, will obtain as appropriate   Assessment/Plan  1. Type I DM with DKA, mild  - DM diagnosed age 58, has neuropathy - A1c 11% on 02/21/14, demonstrating very poor glycemic control at that time  - Pt reports consistent adherence to her insulin regimen, denies recent drug use  - Uncertain etiology for DKA, possibly acute respiratory illness as below - Glucose 368, AG 17, bicarb 19, reflecting mild DKA  - Bolused 2 L NS in ED, continued on NS at 125 cc/hr  - Will transition to sq insulin, feed, switch fluids to D5-1/2 NS once glucose <250, gap closed, acidosis resolved  - Taking 30 units Lantus qHS, Humalog 5-10 TID at home  - Supplement K+, follow q4h BMP overnight  - Update A1c, pending   2. Abdominal pain, N/V - Likely secondary to DKA, improved with Dilaudid, Zofran  - Anticipate resolution with treatment of DKA  - If fails to resolve as expected, will extend workup    3. Acute viral URI - Developed subjective fever, chills, rhinorrhea, and sore throat 4 days ago, now with non-productive cough  - Anticipate this will start to improve in the day or so and resolve within a wk; cough may linger for wks  - If fails to improve and resolve as expected, alternative etiologies will be sought   - Supportive care   4. Depression  - Pt reports as a little worse lately with death of her mother  - Continue  trazodone, monitor  - Pt denies SI, HI, or hallucinations    5. Hypothyroidism  - Stable  - Continue home-dose Synthroid    6. Tobacco abuse, history of cocaine abuse  - UDS ordered, still not collected  - Counseled towards smoking cessation  - RN to provide smoking cessation information prior to discharge     DVT ppx:  SQ Lovenox     Code Status: DNR Family Communication: None at bed side.           Disposition Plan: Admit to inpatient   Date of Service 11/08/2015    Vianne Bulls, MD [facility] Pager [phone]  If 7PM-7AM, please contact night-coverage [email or web address] Password [email or web address] 11/08/2015, 2:15 AM

## 2015-11-08 NOTE — ED Notes (Signed)
RN spoke with Dr. Marin Comment regarding discontinuing insulin drip and possibly changing bed request to telemetry or med-surg. Dr. Marin Comment to see pt.  Awaiting orders.

## 2015-11-08 NOTE — ED Notes (Signed)
MD at bedside. 

## 2015-11-08 NOTE — ED Notes (Signed)
Admitting MD, [place], paged. MD notified of Glucostabilizer stating 0.0 units/hr.

## 2015-11-08 NOTE — Progress Notes (Signed)
Patient coughing bad causing severe pain.  RN notified MD.  Orders received.

## 2015-11-08 NOTE — ED Notes (Signed)
Rn spoke with Dr. Cindy Hazy admission bed request to telemetry. EDP to change order in EPIC. ED secretary notified bed control.

## 2015-11-08 NOTE — Progress Notes (Signed)
RN paged Dr. Legrand Rams to notify of patient's arrival on unit.

## 2015-11-08 NOTE — ED Notes (Signed)
Glucostabilizer states 0.0 units/hr at this time. CBG 135

## 2015-11-09 LAB — GLUCOSE, CAPILLARY
GLUCOSE-CAPILLARY: 277 mg/dL — AB (ref 65–99)
Glucose-Capillary: 200 mg/dL — ABNORMAL HIGH (ref 65–99)
Glucose-Capillary: 92 mg/dL (ref 65–99)
Glucose-Capillary: 276 mg/dL — ABNORMAL HIGH (ref 65–99)

## 2015-11-09 LAB — URINE CULTURE

## 2015-11-09 NOTE — Care Management Note (Signed)
Case Management Note  Patient Details  Name: JELISA AUKER MRN: [id number] Date of Birth: 10/11/1960  Subjective/Objective:           Spoke with patient who is alert and oriented from home with family. Patient stated that she uses no DME but that she is unsteady on her feet and falls a lot. Patient is not on home O2.. Denies difficulty with transportation. Patient is admitted for DKA.     Action/Plan:  PT consult. Home with self care anticipated. Diabetic teaching.  Expected Discharge Date:                  Expected Discharge Plan:  Home/Self Care  In-House Referral:     Discharge planning Services  CM Consult  Post Acute Care Choice:    Choice offered to:     DME Arranged:    DME Agency:     HH Arranged:    HH Agency:     Status of Service:  In process, will continue to follow  Medicare Important Message Given:  Yes Date Medicare IM Given:    Medicare IM give by:    Date Additional Medicare IM Given:    Additional Medicare Important Message give by:     If discussed at [place] of Stay Meetings, dates discussed:    Additional Comments:  Alvie Heidelberg, RN 11/09/2015, 5:23 PM

## 2015-11-09 NOTE — Progress Notes (Signed)
Inpatient Diabetes Program Recommendations  AACE/ADA: New Consensus Statement on Inpatient Glycemic Control (2015)  Target Ranges:  Prepandial:   less than 140 mg/dL      Peak postprandial:   less than 180 mg/dL (1-2 hours)      Critically ill patients:  140 - 180 mg/dL  Results for ALEIA, MORROBEL (MRN [id number]) as of 11/09/2015 11:52  Ref. Range 11/08/2015 12:21 11/08/2015 16:20 11/08/2015 21:10 11/09/2015 07:44 11/09/2015 11:14  Glucose-Capillary Latest Ref Range: 65-99 mg/dL 122 (H) 103 (H) 264 (H) 277 (H) 200 (H)  1/17 Pt currently on Lantus 20 units daily and moderate correction tid.  Please add CHO modified to current diet order.  If BS continue to be elevated, please consider increasing Lantus to 25 units and possibly adding meal coverage. [place], CDE. M.Ed. Pager [phone] Inpatient Diabetes Coordinator

## 2015-11-09 NOTE — Progress Notes (Signed)
Subjective: Patient was admitted due to abdominal pain, nausea and vomiting and mild DKA. Her blood sugar is improving. She is complaining of abdominal pain and nausea.  Objective: Vital signs in last 24 hours: Temp:  [98 F (36.7 C)-99.7 F (37.6 C)] 98.4 F (36.9 C) (02/17 0648) Pulse Rate:  [69-89] 69 (02/17 0648) Resp:  [14-23] 18 (02/17 0648) BP: (84-131)/(37-68) 84/37 mmHg (02/17 0648) SpO2:  [88 %-96 %] 94 % (02/17 0648) Weight:  [68.3 kg (150 lb 9.2 oz)] 68.3 kg (150 lb 9.2 oz) (02/16 1425) Weight change: 0.261 kg (9.2 oz) Last BM Date: 11/06/15  Intake/Output from previous day: 02/16 0701 - 02/17 0700 In: -  Out: 300 [Urine:300]  PHYSICAL EXAM General appearance: alert and no distress Resp: diminished breath sounds bilaterally and rhonchi bilaterally Cardio: S1, S2 normal GI: soft, non-tender; bowel sounds normal; no masses,  no organomegaly Extremities: extremities normal, atraumatic, no cyanosis or edema  Lab Results:  Results for orders placed or performed during the hospital encounter of 11/07/15 (from the past 48 hour(s))  CBG monitoring, ED     Status: Abnormal   Collection Time: 11/07/15  8:07 PM  Result Value Ref Range   Glucose-Capillary 341 (H) 65 - 99 mg/dL  CBC with Differential/Platelet     Status: None   Collection Time: 11/07/15  9:55 PM  Result Value Ref Range   WBC 7.6 4.0 - 10.5 K/uL   RBC 4.82 3.87 - 5.11 MIL/uL   Hemoglobin 14.6 12.0 - 15.0 g/dL   HCT 44.3 36.0 - 46.0 %   MCV 91.9 78.0 - 100.0 fL   MCH 30.3 26.0 - 34.0 pg   MCHC 33.0 30.0 - 36.0 g/dL   RDW 13.5 11.5 - 15.5 %   Platelets 228 150 - 400 K/uL   Neutrophils Relative % 72 %   Neutro Abs 5.4 1.7 - 7.7 K/uL   Lymphocytes Relative 18 %   Lymphs Abs 1.4 0.7 - 4.0 K/uL   Monocytes Relative 10 %   Monocytes Absolute 0.8 0.1 - 1.0 K/uL   Eosinophils Relative 0 %   Eosinophils Absolute 0.0 0.0 - 0.7 K/uL   Basophils Relative 0 %   Basophils Absolute 0.0 0.0 - 0.1 K/uL   Comprehensive metabolic panel     Status: Abnormal   Collection Time: 11/07/15  9:55 PM  Result Value Ref Range   Sodium 131 (L) 135 - 145 mmol/L   Potassium 4.2 3.5 - 5.1 mmol/L   Chloride 95 (L) 101 - 111 mmol/L   CO2 19 (L) 22 - 32 mmol/L   Glucose, Bld 368 (H) 65 - 99 mg/dL   BUN 14 6 - 20 mg/dL   Creatinine, Ser 0.91 0.44 - 1.00 mg/dL   Calcium 8.8 (L) 8.9 - 10.3 mg/dL   Total Protein 8.2 (H) 6.5 - 8.1 g/dL   Albumin 4.3 3.5 - 5.0 g/dL   AST 24 15 - 41 U/L   ALT 15 14 - 54 U/L   Alkaline Phosphatase 110 38 - 126 U/L   Total Bilirubin 0.7 0.3 - 1.2 mg/dL   GFR calc non Af Amer >60 >60 mL/min   GFR calc Af Amer >60 >60 mL/min    Comment: (NOTE) The eGFR has been calculated using the CKD EPI equation. This calculation has not been validated in all clinical situations. eGFR's persistently <60 mL/min signify possible Chronic Kidney Disease.    Anion gap 17 (H) 5 - 15  Ethanol  Status: None   Collection Time: 11/07/15  9:55 PM  Result Value Ref Range   Alcohol, Ethyl (B) <5 <5 mg/dL    Comment:        LOWEST DETECTABLE LIMIT FOR SERUM ALCOHOL IS 5 mg/dL FOR MEDICAL PURPOSES ONLY   I-stat chem 8, ed     Status: Abnormal   Collection Time: 11/07/15 10:45 PM  Result Value Ref Range   Sodium 132 (L) 135 - 145 mmol/L   Potassium 4.4 3.5 - 5.1 mmol/L   Chloride 99 (L) 101 - 111 mmol/L   BUN 14 6 - 20 mg/dL   Creatinine, Ser 0.70 0.44 - 1.00 mg/dL   Glucose, Bld 367 (H) 65 - 99 mg/dL   Calcium, Ion 1.02 (L) 1.12 - 1.23 mmol/L   TCO2 18 0 - 100 mmol/L   Hemoglobin 17.0 (H) 12.0 - 15.0 g/dL   HCT 50.0 (H) 36.0 - 46.0 %  CBG monitoring, ED     Status: Abnormal   Collection Time: 11/08/15 12:12 AM  Result Value Ref Range   Glucose-Capillary 281 (H) 65 - 99 mg/dL  CBG monitoring, ED     Status: Abnormal   Collection Time: 11/08/15  1:13 AM  Result Value Ref Range   Glucose-Capillary 259 (H) 65 - 99 mg/dL  CBG monitoring, ED     Status: Abnormal   Collection Time:  11/08/15  2:16 AM  Result Value Ref Range   Glucose-Capillary 201 (H) 65 - 99 mg/dL  Basic metabolic panel     Status: Abnormal   Collection Time: 11/08/15  2:30 AM  Result Value Ref Range   Sodium 134 (L) 135 - 145 mmol/L   Potassium 3.8 3.5 - 5.1 mmol/L   Chloride 105 101 - 111 mmol/L   CO2 18 (L) 22 - 32 mmol/L   Glucose, Bld 198 (H) 65 - 99 mg/dL   BUN 12 6 - 20 mg/dL   Creatinine, Ser 0.74 0.44 - 1.00 mg/dL   Calcium 7.9 (L) 8.9 - 10.3 mg/dL   GFR calc non Af Amer >60 >60 mL/min   GFR calc Af Amer >60 >60 mL/min    Comment: (NOTE) The eGFR has been calculated using the CKD EPI equation. This calculation has not been validated in all clinical situations. eGFR's persistently <60 mL/min signify possible Chronic Kidney Disease.    Anion gap 11 5 - 15  Magnesium     Status: Abnormal   Collection Time: 11/08/15  2:30 AM  Result Value Ref Range   Magnesium 1.6 (L) 1.7 - 2.4 mg/dL  CBG monitoring, ED     Status: Abnormal   Collection Time: 11/08/15  3:18 AM  Result Value Ref Range   Glucose-Capillary 124 (H) 65 - 99 mg/dL  CBG monitoring, ED     Status: Abnormal   Collection Time: 11/08/15  4:23 AM  Result Value Ref Range   Glucose-Capillary 118 (H) 65 - 99 mg/dL  CBG monitoring, ED     Status: Abnormal   Collection Time: 11/08/15  5:16 AM  Result Value Ref Range   Glucose-Capillary 135 (H) 65 - 99 mg/dL  CBG monitoring, ED     Status: Abnormal   Collection Time: 11/08/15  6:12 AM  Result Value Ref Range   Glucose-Capillary 187 (H) 65 - 99 mg/dL  CBG monitoring, ED     Status: Abnormal   Collection Time: 11/08/15  6:14 AM  Result Value Ref Range   Glucose-Capillary 178 (H) 65 - 99  mg/dL  Basic metabolic panel     Status: Abnormal   Collection Time: 11/08/15  6:45 AM  Result Value Ref Range   Sodium 137 135 - 145 mmol/L   Potassium 4.3 3.5 - 5.1 mmol/L   Chloride 104 101 - 111 mmol/L   CO2 23 22 - 32 mmol/L   Glucose, Bld 177 (H) 65 - 99 mg/dL   BUN 10 6 - 20 mg/dL    Creatinine, Ser 0.77 0.44 - 1.00 mg/dL   Calcium 8.3 (L) 8.9 - 10.3 mg/dL   GFR calc non Af Amer >60 >60 mL/min   GFR calc Af Amer >60 >60 mL/min    Comment: (NOTE) The eGFR has been calculated using the CKD EPI equation. This calculation has not been validated in all clinical situations. eGFR's persistently <60 mL/min signify possible Chronic Kidney Disease.    Anion gap 10 5 - 15  CBG monitoring, ED     Status: Abnormal   Collection Time: 11/08/15  7:07 AM  Result Value Ref Range   Glucose-Capillary 158 (H) 65 - 99 mg/dL  Urinalysis, Routine w reflex microscopic (not at [facility])     Status: Abnormal   Collection Time: 11/08/15  7:39 AM  Result Value Ref Range   Color, Urine YELLOW YELLOW   APPearance CLEAR CLEAR   Specific Gravity, Urine 1.015 1.005 - 1.030   pH 5.5 5.0 - 8.0   Glucose, UA 500 (A) NEGATIVE mg/dL   Hgb urine dipstick NEGATIVE NEGATIVE   Bilirubin Urine NEGATIVE NEGATIVE   Ketones, ur >80 (A) NEGATIVE mg/dL   Protein, ur NEGATIVE NEGATIVE mg/dL   Nitrite NEGATIVE NEGATIVE   Leukocytes, UA NEGATIVE NEGATIVE    Comment: MICROSCOPIC NOT DONE ON URINES WITH NEGATIVE PROTEIN, BLOOD, LEUKOCYTES, NITRITE, OR GLUCOSE <1000 mg/dL.  Urine rapid drug screen (hosp performed)not at [facility]     Status: Abnormal   Collection Time: 11/08/15  7:39 AM  Result Value Ref Range   Opiates POSITIVE (A) NONE DETECTED   Cocaine POSITIVE (A) NONE DETECTED   Benzodiazepines NONE DETECTED NONE DETECTED   Amphetamines NONE DETECTED NONE DETECTED   Tetrahydrocannabinol NONE DETECTED NONE DETECTED   Barbiturates NONE DETECTED NONE DETECTED    Comment:        DRUG SCREEN FOR MEDICAL PURPOSES ONLY.  IF CONFIRMATION IS NEEDED FOR ANY PURPOSE, NOTIFY LAB WITHIN 5 DAYS.        LOWEST DETECTABLE LIMITS FOR URINE DRUG SCREEN Drug Class       Cutoff (ng/mL) Amphetamine      1000 Barbiturate      200 Benzodiazepine   109 Tricyclics       323 Opiates          300 Cocaine          300 THC               50   CBG monitoring, ED     Status: Abnormal   Collection Time: 11/08/15  8:12 AM  Result Value Ref Range   Glucose-Capillary 144 (H) 65 - 99 mg/dL  CBG monitoring, ED     Status: Abnormal   Collection Time: 11/08/15  9:23 AM  Result Value Ref Range   Glucose-Capillary 144 (H) 65 - 99 mg/dL  Basic metabolic panel     Status: Abnormal   Collection Time: 11/08/15 10:08 AM  Result Value Ref Range   Sodium 136 135 - 145 mmol/L   Potassium 4.0 3.5 - 5.1 mmol/L  Chloride 104 101 - 111 mmol/L   CO2 22 22 - 32 mmol/L   Glucose, Bld 146 (H) 65 - 99 mg/dL   BUN 9 6 - 20 mg/dL   Creatinine, Ser 0.78 0.44 - 1.00 mg/dL   Calcium 8.4 (L) 8.9 - 10.3 mg/dL   GFR calc non Af Amer >60 >60 mL/min   GFR calc Af Amer >60 >60 mL/min    Comment: (NOTE) The eGFR has been calculated using the CKD EPI equation. This calculation has not been validated in all clinical situations. eGFR's persistently <60 mL/min signify possible Chronic Kidney Disease.    Anion gap 10 5 - 15  CBG monitoring, ED     Status: Abnormal   Collection Time: 11/08/15 12:21 PM  Result Value Ref Range   Glucose-Capillary 122 (H) 65 - 99 mg/dL  Glucose, capillary     Status: Abnormal   Collection Time: 11/08/15  4:20 PM  Result Value Ref Range   Glucose-Capillary 103 (H) 65 - 99 mg/dL   Comment 1 Notify RN    Comment 2 Document in Chart   Glucose, capillary     Status: Abnormal   Collection Time: 11/08/15  9:10 PM  Result Value Ref Range   Glucose-Capillary 264 (H) 65 - 99 mg/dL   Comment 1 Notify RN    Comment 2 Document in Chart   Glucose, capillary     Status: Abnormal   Collection Time: 11/09/15  7:44 AM  Result Value Ref Range   Glucose-Capillary 277 (H) 65 - 99 mg/dL    ABGS  Recent Labs  11/07/15 2245  TCO2 18   CULTURES No results found for this or any previous visit (from the past 240 hour(s)). Studies/Results: Dg Abd Acute W/chest  11/07/2015  CLINICAL DATA:  55 year old female with  hyperglycemia, nausea, vomiting, diarrhea and dry cough as well as generalized weakness EXAM: DG ABDOMEN ACUTE W/ 1V CHEST COMPARISON:  Prior chest x-ray 05/29/2014 FINDINGS: There is no evidence of dilated bowel loops or free intraperitoneal air. No radiopaque calculi or other significant radiographic abnormality is seen. Heart size and mediastinal contours are within normal limits. Both lungs are clear. IMPRESSION: Negative abdominal radiographs.  No acute cardiopulmonary disease. Electronically Signed   By: Jacqulynn Cadet M.D.   On: 11/07/2015 22:45    Medications: I have reviewed the patient's current medications.  Assesment:   Principal Problem:   DKA (diabetic ketoacidoses) (HCC) Active Problems:   Nausea with vomiting   Abdominal pain, generalized   Acute URI   Depression   Hypothyroidism   Hyperglycemia    Plan:  Medications reviewed Will continue insulin therapy Will continue to monitor blood sugar Will do endocrine consult Continue pain medications    LOS: 2 days   Tramain Gershman 11/09/2015, 8:10 AM

## 2015-11-09 NOTE — Care Management Important Message (Signed)
Important Message  Patient Details  Name: TAMMRA DOBISH MRN: [id number] Date of Birth: [birth year]   Medicare Important Message Given:       Alvie Heidelberg, RN 11/09/2015, 4:48 PM

## 2015-11-10 LAB — GLUCOSE, CAPILLARY
GLUCOSE-CAPILLARY: 321 mg/dL — AB (ref 65–99)
GLUCOSE-CAPILLARY: 106 mg/dL — AB (ref 65–99)
Glucose-Capillary: 300 mg/dL — ABNORMAL HIGH (ref 65–99)
Glucose-Capillary: 216 mg/dL — ABNORMAL HIGH (ref 65–99)
Glucose-Capillary: 180 mg/dL — ABNORMAL HIGH (ref 65–99)

## 2015-11-10 MED ORDER — INSULIN GLARGINE 100 UNIT/ML ~~LOC~~ SOLN
30.0000 [IU] | SUBCUTANEOUS | Status: DC
  Administered 2015-11-11: 30 [IU] via SUBCUTANEOUS
  Filled 2015-11-10 (×2): qty 0.3

## 2015-11-10 MED ORDER — AZITHROMYCIN 250 MG PO TABS
500.0000 mg | ORAL_TABLET | Freq: Every day | ORAL | Status: AC
  Administered 2015-11-10: 500 mg via ORAL
  Filled 2015-11-10: qty 2

## 2015-11-10 MED ORDER — IPRATROPIUM-ALBUTEROL 0.5-2.5 (3) MG/3ML IN SOLN
3.0000 mL | Freq: Four times a day (QID) | RESPIRATORY_TRACT | Status: DC
  Administered 2015-11-10: 3 mL via RESPIRATORY_TRACT
  Filled 2015-11-10 (×3): qty 3

## 2015-11-10 MED ORDER — SODIUM CHLORIDE 0.9 % IV BOLUS (SEPSIS)
1000.0000 mL | Freq: Once | INTRAVENOUS | Status: AC
  Administered 2015-11-10: 1000 mL via INTRAVENOUS

## 2015-11-10 MED ORDER — AZITHROMYCIN 250 MG PO TABS
250.0000 mg | ORAL_TABLET | Freq: Every day | ORAL | Status: DC
  Administered 2015-11-11 – 2015-11-13 (×3): 250 mg via ORAL
  Filled 2015-11-10 (×3): qty 1

## 2015-11-10 NOTE — Progress Notes (Signed)
Subjective: Patient is complaining of cough and congestion. Her sputum is brownish. Patient smokes about a pack of tobacco. Her blood sugar is running in the range of 300 mg/dl.  Objective: Vital signs in last 24 hours: Temp:  [97.9 F (36.6 C)-98.4 F (36.9 C)] 98.3 F (36.8 C) (02/18 0445) Pulse Rate:  [74-78] 78 (02/18 0445) Resp:  [20] 20 (02/18 0445) BP: (106-127)/(53-89) 127/53 mmHg (02/18 0445) SpO2:  [95 %-96 %] 95 % (02/18 0445) Weight change:  Last BM Date: 11/06/15  Intake/Output from previous day: 02/17 0701 - 02/18 0700 In: 240 [P.O.:240] Out: 2350 [Urine:2350]  PHYSICAL EXAM General appearance: alert and no distress Resp: diminished breath sounds bilaterally and rhonchi bilaterally Cardio: S1, S2 normal GI: soft, non-tender; bowel sounds normal; no masses,  no organomegaly Extremities: extremities normal, atraumatic, no cyanosis or edema  Lab Results:  Results for orders placed or performed during the hospital encounter of 11/07/15 (from the past 48 hour(s))  CBG monitoring, ED     Status: Abnormal   Collection Time: 11/08/15  9:23 AM  Result Value Ref Range   Glucose-Capillary 144 (H) 65 - 99 mg/dL  Basic metabolic panel     Status: Abnormal   Collection Time: 11/08/15 10:08 AM  Result Value Ref Range   Sodium 136 135 - 145 mmol/L   Potassium 4.0 3.5 - 5.1 mmol/L   Chloride 104 101 - 111 mmol/L   CO2 22 22 - 32 mmol/L   Glucose, Bld 146 (H) 65 - 99 mg/dL   BUN 9 6 - 20 mg/dL   Creatinine, Ser 0.78 0.44 - 1.00 mg/dL   Calcium 8.4 (L) 8.9 - 10.3 mg/dL   GFR calc non Af Amer >60 >60 mL/min   GFR calc Af Amer >60 >60 mL/min    Comment: (NOTE) The eGFR has been calculated using the CKD EPI equation. This calculation has not been validated in all clinical situations. eGFR's persistently <60 mL/min signify possible Chronic Kidney Disease.    Anion gap 10 5 - 15  CBG monitoring, ED     Status: Abnormal   Collection Time: 11/08/15 12:21 PM  Result Value  Ref Range   Glucose-Capillary 122 (H) 65 - 99 mg/dL  Glucose, capillary     Status: Abnormal   Collection Time: 11/08/15  4:20 PM  Result Value Ref Range   Glucose-Capillary 103 (H) 65 - 99 mg/dL   Comment 1 Notify RN    Comment 2 Document in Chart   Glucose, capillary     Status: Abnormal   Collection Time: 11/08/15  9:10 PM  Result Value Ref Range   Glucose-Capillary 264 (H) 65 - 99 mg/dL   Comment 1 Notify RN    Comment 2 Document in Chart   Glucose, capillary     Status: Abnormal   Collection Time: 11/09/15  7:44 AM  Result Value Ref Range   Glucose-Capillary 277 (H) 65 - 99 mg/dL  Glucose, capillary     Status: Abnormal   Collection Time: 11/09/15 11:14 AM  Result Value Ref Range   Glucose-Capillary 200 (H) 65 - 99 mg/dL  Glucose, capillary     Status: None   Collection Time: 11/09/15  4:10 PM  Result Value Ref Range   Glucose-Capillary 92 65 - 99 mg/dL  Glucose, capillary     Status: Abnormal   Collection Time: 11/09/15  8:45 PM  Result Value Ref Range   Glucose-Capillary 276 (H) 65 - 99 mg/dL   Comment 1 Notify  RN    Comment 2 Document in Chart   Glucose, capillary     Status: Abnormal   Collection Time: 11/10/15  4:49 AM  Result Value Ref Range   Glucose-Capillary 321 (H) 65 - 99 mg/dL   Comment 1 Notify RN    Comment 2 Document in Chart   Glucose, capillary     Status: Abnormal   Collection Time: 11/10/15  8:32 AM  Result Value Ref Range   Glucose-Capillary 300 (H) 65 - 99 mg/dL    ABGS  Recent Labs  11/07/15 2245  TCO2 18   CULTURES Recent Results (from the past 240 hour(s))  Urine culture     Status: None   Collection Time: 11/08/15  7:40 AM  Result Value Ref Range Status   Specimen Description URINE, CLEAN CATCH  Final   Special Requests NONE  Final   Culture   Final    MULTIPLE SPECIES PRESENT, SUGGEST RECOLLECTION Performed at [facility]    Report Status 11/09/2015 FINAL  Final   Studies/Results: No results found.  Medications:  I have reviewed the patient's current medications.  Assesment:   Principal Problem:   DKA (diabetic ketoacidoses) (HCC) Active Problems:   Nausea with vomiting   Abdominal pain, generalized   Acute URI   Depression   Hypothyroidism   Hyperglycemia Acute bronchitis  Plan:  Medications reviewed Will increase lantus insulin to 30 unit Will continue to monitor blood sugar Will start on DUONEB and Z-Pack Will do endocrine consult pending Continue pain medications    LOS: 3 days   [name] 11/10/2015, 9:02 AM

## 2015-11-11 LAB — GLUCOSE, CAPILLARY
GLUCOSE-CAPILLARY: 105 mg/dL — AB (ref 65–99)
GLUCOSE-CAPILLARY: 199 mg/dL — AB (ref 65–99)
Glucose-Capillary: 316 mg/dL — ABNORMAL HIGH (ref 65–99)
Glucose-Capillary: 132 mg/dL — ABNORMAL HIGH (ref 65–99)

## 2015-11-11 LAB — RESPIRATORY VIRUS PANEL
ADENOVIRUS: NEGATIVE
Influenza A: POSITIVE — AB
Influenza B: NEGATIVE
METAPNEUMOVIRUS: NEGATIVE
Parainfluenza 1: NEGATIVE
Parainfluenza 2: NEGATIVE
Parainfluenza 3: NEGATIVE
RESPIRATORY SYNCYTIAL VIRUS A: NEGATIVE
RHINOVIRUS: NEGATIVE
Respiratory Syncytial Virus B: NEGATIVE

## 2015-11-11 MED ORDER — SODIUM CHLORIDE 0.9 % IV BOLUS (SEPSIS)
1000.0000 mL | Freq: Once | INTRAVENOUS | Status: AC
  Administered 2015-11-11: 1000 mL via INTRAVENOUS

## 2015-11-11 MED ORDER — INSULIN GLARGINE 100 UNIT/ML ~~LOC~~ SOLN
40.0000 [IU] | SUBCUTANEOUS | Status: DC
  Administered 2015-11-12: 40 [IU] via SUBCUTANEOUS
  Filled 2015-11-11: qty 0.4

## 2015-11-11 MED ORDER — OSELTAMIVIR PHOSPHATE 75 MG PO CAPS
75.0000 mg | ORAL_CAPSULE | Freq: Two times a day (BID) | ORAL | Status: DC
  Administered 2015-11-11 – 2015-11-13 (×5): 75 mg via ORAL
  Filled 2015-11-11 (×5): qty 1

## 2015-11-11 MED ORDER — IPRATROPIUM-ALBUTEROL 0.5-2.5 (3) MG/3ML IN SOLN
3.0000 mL | Freq: Three times a day (TID) | RESPIRATORY_TRACT | Status: DC
  Administered 2015-11-11 – 2015-11-13 (×5): 3 mL via RESPIRATORY_TRACT
  Filled 2015-11-11 (×6): qty 3

## 2015-11-11 NOTE — Progress Notes (Addendum)
Subjective: Patient is still complaining of cough and congestion. Patient is positive for influenza A. .  Objective: Vital signs in last 24 hours: Temp:  [97.7 F (36.5 C)-98.2 F (36.8 C)] 98.2 F (36.8 C) (02/19 0537) Pulse Rate:  [72-84] 81 (02/19 0537) Resp:  [18-20] 20 (02/19 0537) BP: (70-111)/(39-72) 106/55 mmHg (02/19 0537) SpO2:  [94 %-98 %] 98 % (02/19 0631) Weight change:  Last BM Date: 11/06/15  Intake/Output from previous day: 02/18 0701 - 02/19 0700 In: 1100 [P.O.:100; IV Piggyback:1000] Out: 1800 [Urine:1800]  PHYSICAL EXAM General appearance: alert and no distress Resp: diminished breath sounds bilaterally and rhonchi bilaterally Cardio: S1, S2 normal GI: soft, non-tender; bowel sounds normal; no masses,  no organomegaly Extremities: extremities normal, atraumatic, no cyanosis or edema  Lab Results:  Results for orders placed or performed during the hospital encounter of 11/07/15 (from the past 48 hour(s))  Glucose, capillary     Status: Abnormal   Collection Time: 11/09/15 11:14 AM  Result Value Ref Range   Glucose-Capillary 200 (H) 65 - 99 mg/dL  Glucose, capillary     Status: None   Collection Time: 11/09/15  4:10 PM  Result Value Ref Range   Glucose-Capillary 92 65 - 99 mg/dL  Glucose, capillary     Status: Abnormal   Collection Time: 11/09/15  8:45 PM  Result Value Ref Range   Glucose-Capillary 276 (H) 65 - 99 mg/dL   Comment 1 Notify RN    Comment 2 Document in Chart   Glucose, capillary     Status: Abnormal   Collection Time: 11/10/15  4:49 AM  Result Value Ref Range   Glucose-Capillary 321 (H) 65 - 99 mg/dL   Comment 1 Notify RN    Comment 2 Document in Chart   Glucose, capillary     Status: Abnormal   Collection Time: 11/10/15  8:32 AM  Result Value Ref Range   Glucose-Capillary 300 (H) 65 - 99 mg/dL  Glucose, capillary     Status: Abnormal   Collection Time: 11/10/15 11:10 AM  Result Value Ref Range   Glucose-Capillary 216 (H) 65 -  99 mg/dL   Comment 1 Notify RN    Comment 2 Document in Chart   Glucose, capillary     Status: Abnormal   Collection Time: 11/10/15  5:14 PM  Result Value Ref Range   Glucose-Capillary 106 (H) 65 - 99 mg/dL   Comment 1 Notify RN    Comment 2 Document in Chart   Glucose, capillary     Status: Abnormal   Collection Time: 11/10/15  8:11 PM  Result Value Ref Range   Glucose-Capillary 180 (H) 65 - 99 mg/dL   Comment 1 Notify RN    Comment 2 Document in Chart   Glucose, capillary     Status: Abnormal   Collection Time: 11/11/15  8:30 AM  Result Value Ref Range   Glucose-Capillary 316 (H) 65 - 99 mg/dL    ABGS No results for input(s): PHART, PO2ART, TCO2, HCO3 in the last 72 hours.  Invalid input(s): PCO2 CULTURES Recent Results (from the past 240 hour(s))  Urine culture     Status: None   Collection Time: 11/08/15  7:40 AM  Result Value Ref Range Status   Specimen Description URINE, CLEAN CATCH  Final   Special Requests NONE  Final   Culture   Final    MULTIPLE SPECIES PRESENT, SUGGEST RECOLLECTION Performed at [facility]    Report Status 11/09/2015 FINAL  Final  Respiratory virus panel     Status: Abnormal   Collection Time: 11/08/15 10:35 AM  Result Value Ref Range Status   Respiratory Syncytial Virus A Negative Negative Final   Respiratory Syncytial Virus B Negative Negative Final   Influenza A Positive (A) Negative Final    Comment: Subtype: H3   Influenza B Negative Negative Final   Parainfluenza 1 Negative Negative Final   Parainfluenza 2 Negative Negative Final   Parainfluenza 3 Negative Negative Final   Metapneumovirus Negative Negative Final   Rhinovirus Negative Negative Final   Adenovirus Negative Negative Final    Comment: (NOTE) Performed At: [facility] [address] [place], [place] [id number] Lindon Romp MD [id number]    Studies/Results: No results found.  Medications: I have reviewed the patient's current  medications.  Assesment:   Principal Problem:   DKA (diabetic ketoacidoses) (HCC) Active Problems:   Nausea with vomiting   Abdominal pain, generalized   Acute URI   Depression   Hypothyroidism   Hyperglycemia Acute bronchitis Influenza A  Plan:  Medications reviewed Will start on TAMIFLU 75 mg po BID Will continue to monitor blood sugar Continue DUONEB Will increase lantus insulin to 40 units daily    LOS: 4 days   Keely Drennan 11/11/2015, 9:02 AM

## 2015-11-12 DIAGNOSIS — J9809 Other diseases of bronchus, not elsewhere classified: Secondary | ICD-10-CM | POA: Diagnosis not present

## 2015-11-12 DIAGNOSIS — J09X1 Influenza due to identified novel influenza A virus with pneumonia: Secondary | ICD-10-CM | POA: Diagnosis not present

## 2015-11-12 DIAGNOSIS — E1169 Type 2 diabetes mellitus with other specified complication: Secondary | ICD-10-CM | POA: Diagnosis not present

## 2015-11-12 LAB — GLUCOSE, CAPILLARY
GLUCOSE-CAPILLARY: 157 mg/dL — AB (ref 65–99)
GLUCOSE-CAPILLARY: 70 mg/dL (ref 65–99)
GLUCOSE-CAPILLARY: 63 mg/dL — AB (ref 65–99)
GLUCOSE-CAPILLARY: 280 mg/dL — AB (ref 65–99)
GLUCOSE-CAPILLARY: 159 mg/dL — AB (ref 65–99)
Glucose-Capillary: 406 mg/dL — ABNORMAL HIGH (ref 65–99)
Glucose-Capillary: 351 mg/dL — ABNORMAL HIGH (ref 65–99)
Glucose-Capillary: 50 mg/dL — ABNORMAL LOW (ref 65–99)

## 2015-11-12 MED ORDER — PANTOPRAZOLE SODIUM 40 MG PO TBEC
40.0000 mg | DELAYED_RELEASE_TABLET | Freq: Every day | ORAL | Status: DC
  Administered 2015-11-13: 40 mg via ORAL
  Filled 2015-11-12: qty 1

## 2015-11-12 MED ORDER — INSULIN ASPART 100 UNIT/ML ~~LOC~~ SOLN
5.0000 [IU] | Freq: Three times a day (TID) | SUBCUTANEOUS | Status: DC
  Administered 2015-11-12 (×2): 5 [IU] via SUBCUTANEOUS

## 2015-11-12 MED ORDER — INSULIN GLARGINE 100 UNIT/ML ~~LOC~~ SOLN
50.0000 [IU] | SUBCUTANEOUS | Status: DC
  Administered 2015-11-13: 50 [IU] via SUBCUTANEOUS
  Filled 2015-11-12 (×2): qty 0.5

## 2015-11-12 NOTE — Progress Notes (Signed)
Subjective: Patient continue to complaining of cough and congestion. Her blood sugar is still running in the range of 400 mg/dl. No fever or chills. .  Objective: Vital signs in last 24 hours: Temp:  [98.2 F (36.8 C)-98.9 F (37.2 C)] 98.9 F (37.2 C) (02/20 0605) Pulse Rate:  [70-75] 74 (02/20 0605) Resp:  [18] 18 (02/20 0605) BP: (67-120)/(36-70) 90/70 mmHg (02/20 0605) SpO2:  [95 %-98 %] 96 % (02/20 0605) Weight change:  Last BM Date: 11/09/15  Intake/Output from previous day: 02/19 0701 - 02/20 0700 In: 960 [P.O.:960] Out: 3 [Urine:3]  PHYSICAL EXAM General appearance: alert and no distress Resp: diminished breath sounds bilaterally and rhonchi bilaterally Cardio: S1, S2 normal GI: soft, non-tender; bowel sounds normal; no masses,  no organomegaly Extremities: extremities normal, atraumatic, no cyanosis or edema  Lab Results:  Results for orders placed or performed during the hospital encounter of 11/07/15 (from the past 48 hour(s))  Glucose, capillary     Status: Abnormal   Collection Time: 11/10/15  8:32 AM  Result Value Ref Range   Glucose-Capillary 300 (H) 65 - 99 mg/dL  Glucose, capillary     Status: Abnormal   Collection Time: 11/10/15 11:10 AM  Result Value Ref Range   Glucose-Capillary 216 (H) 65 - 99 mg/dL   Comment 1 Notify RN    Comment 2 Document in Chart   Glucose, capillary     Status: Abnormal   Collection Time: 11/10/15  5:14 PM  Result Value Ref Range   Glucose-Capillary 106 (H) 65 - 99 mg/dL   Comment 1 Notify RN    Comment 2 Document in Chart   Glucose, capillary     Status: Abnormal   Collection Time: 11/10/15  8:11 PM  Result Value Ref Range   Glucose-Capillary 180 (H) 65 - 99 mg/dL   Comment 1 Notify RN    Comment 2 Document in Chart   Glucose, capillary     Status: Abnormal   Collection Time: 11/11/15  8:30 AM  Result Value Ref Range   Glucose-Capillary 316 (H) 65 - 99 mg/dL  Glucose, capillary     Status: Abnormal   Collection  Time: 11/11/15 12:07 PM  Result Value Ref Range   Glucose-Capillary 132 (H) 65 - 99 mg/dL  Glucose, capillary     Status: Abnormal   Collection Time: 11/11/15  5:08 PM  Result Value Ref Range   Glucose-Capillary 105 (H) 65 - 99 mg/dL  Glucose, capillary     Status: Abnormal   Collection Time: 11/11/15  8:33 PM  Result Value Ref Range   Glucose-Capillary 199 (H) 65 - 99 mg/dL   Comment 1 Notify RN    Comment 2 Document in Chart   Glucose, capillary     Status: Abnormal   Collection Time: 11/12/15  6:04 AM  Result Value Ref Range   Glucose-Capillary 406 (H) 65 - 99 mg/dL    ABGS No results for input(s): PHART, PO2ART, TCO2, HCO3 in the last 72 hours.  Invalid input(s): PCO2 CULTURES Recent Results (from the past 240 hour(s))  Urine culture     Status: None   Collection Time: 11/08/15  7:40 AM  Result Value Ref Range Status   Specimen Description URINE, CLEAN CATCH  Final   Special Requests NONE  Final   Culture   Final    MULTIPLE SPECIES PRESENT, SUGGEST RECOLLECTION Performed at [facility]    Report Status 11/09/2015 FINAL  Final  Respiratory virus panel  Status: Abnormal   Collection Time: 11/08/15 10:35 AM  Result Value Ref Range Status   Respiratory Syncytial Virus A Negative Negative Final   Respiratory Syncytial Virus B Negative Negative Final   Influenza A Positive (A) Negative Final    Comment: Subtype: H3   Influenza B Negative Negative Final   Parainfluenza 1 Negative Negative Final   Parainfluenza 2 Negative Negative Final   Parainfluenza 3 Negative Negative Final   Metapneumovirus Negative Negative Final   Rhinovirus Negative Negative Final   Adenovirus Negative Negative Final    Comment: (NOTE) Performed At: [facility] [place], [place] [id number] Lindon Romp MD [id number]    Studies/Results: No results found.  Medications: I have reviewed the patient's current medications.  Assesment:   Principal  Problem:   DKA (diabetic ketoacidoses) (HCC) Active Problems:   Nausea with vomiting   Abdominal pain, generalized   Acute URI   Depression   Hypothyroidism   Hyperglycemia Acute bronchitis Influenza A  Plan:  Medications reviewed Will start on TAMIFLU 75 mg po BID Will continue to monitor blood sugar Continue DUONEB Will increase lantus insulin to 50 units daily Will start on meal time insulin   LOS: 5 days   Laderius Valbuena 11/12/2015, 8:16 AM

## 2015-11-12 NOTE — Progress Notes (Signed)
Inpatient Diabetes Program Recommendations  AACE/ADA: New Consensus Statement on Inpatient Glycemic Control (2015)  Target Ranges:  Prepandial:   less than 140 mg/dL      Peak postprandial:   less than 180 mg/dL (1-2 hours)      Critically ill patients:  140 - 180 mg/dL   Review of Glycemic Control:  Results for JALISHA, STONE (MRN [id number]) as of 11/12/2015 15:09  Ref. Range 11/11/2015 12:07 11/11/2015 17:08 11/11/2015 20:33 11/12/2015 06:04 11/12/2015 08:39 11/12/2015 11:56 11/12/2015 14:02  Glucose-Capillary Latest Ref Range: 65-99 mg/dL 132 (H) 105 (H) 199 (H) 406 (H) 351 (H) 157 (H) 70    Diabetes history: Type 2 diabetes Outpatient Diabetes medications: Lantus 30 units q HS, Humalog 5-10 units tid with meals Current orders for Inpatient glycemic control:  Lantus 50 units q 24 hours, Novolog 5 units tid with meals, Novolog moderate tid with meals  Inpatient Diabetes Program Recommendations:     Note that CBG's especially high first thing in the AM.  Patient is being given Lantus at 0530 AM.  May consider changing dose to q HS?  Also consider reducing Lantus back to 40 units q HS.  It may be helpful to check blood sugars q 4 hours just to make sure that blood sugar is not rebounding in the AM from a low?  Thanks, Adah Perl, RN, BC-ADM Inpatient Diabetes Coordinator Pager [phone] (8a-5p)

## 2015-11-12 NOTE — Care Management Note (Signed)
Case Management Note  Patient Details  Name: [name] MRN: [id number] Date of Birth: 1960/11/23  Subjective/Objective:           Spoke with patient again for discharge planning. Patient voiced concerns of "not having a place to go back to when discharged" Patient stated that she has been living in a home that "has drugs" she does not want to go back to it.   Patient has disability of $800 per month and medicare/medicaid.     Discussed with CSW [name] who will see the patient this afternoon.  Action/Plan:  Home with self care. Expected Discharge Date:                  Expected Discharge Plan:  Home/Self Care  In-House Referral:     Discharge planning Services  CM Consult  Post Acute Care Choice:    Choice offered to:     DME Arranged:    DME Agency:     HH Arranged:    HH Agency:     Status of Service:  In process, will continue to follow  Medicare Important Message Given:  Yes Date Medicare IM Given:    Medicare IM give by:    Date Additional Medicare IM Given:    Additional Medicare Important Message give by:     If discussed at [place] of Stay Meetings, dates discussed:    Additional Comments:  Alvie Heidelberg, RN 11/12/2015, 1:41 PM

## 2015-11-13 LAB — CBC
HEMATOCRIT: 40.1 % (ref 36.0–46.0)
Hemoglobin: 13.3 g/dL (ref 12.0–15.0)
MCH: 30.7 pg (ref 26.0–34.0)
MCHC: 33.2 g/dL (ref 30.0–36.0)
MCV: 92.6 fL (ref 78.0–100.0)
PLATELETS: 241 10*3/uL (ref 150–400)
RBC: 4.33 MIL/uL (ref 3.87–5.11)
RDW: 14 % (ref 11.5–15.5)
WBC: 6.3 10*3/uL (ref 4.0–10.5)

## 2015-11-13 LAB — BASIC METABOLIC PANEL
Anion gap: 8 (ref 5–15)
BUN: 13 mg/dL (ref 6–20)
CALCIUM: 9.2 mg/dL (ref 8.9–10.3)
CO2: 30 mmol/L (ref 22–32)
CREATININE: 0.8 mg/dL (ref 0.44–1.00)
Chloride: 100 mmol/L — ABNORMAL LOW (ref 101–111)
Glucose, Bld: 378 mg/dL — ABNORMAL HIGH (ref 65–99)
Potassium: 5.4 mmol/L — ABNORMAL HIGH (ref 3.5–5.1)
Sodium: 138 mmol/L (ref 135–145)

## 2015-11-13 LAB — GLUCOSE, CAPILLARY: Glucose-Capillary: 274 mg/dL — ABNORMAL HIGH (ref 65–99)

## 2015-11-13 MED ORDER — INSULIN GLARGINE 100 UNIT/ML SOLOSTAR PEN: 50.0000 [IU] | PEN_INJECTOR | Freq: Every day | SUBCUTANEOUS | Status: DC

## 2015-11-13 MED ORDER — OSELTAMIVIR PHOSPHATE 75 MG PO CAPS: 75.0000 mg | ORAL_CAPSULE | Freq: Two times a day (BID) | ORAL | Status: DC

## 2015-11-13 MED ORDER — BENZONATATE 100 MG PO CAPS: 100.0000 mg | ORAL_CAPSULE | Freq: Three times a day (TID) | ORAL | Status: DC

## 2015-11-13 MED ORDER — HYDROCODONE-ACETAMINOPHEN 5-325 MG PO TABS: 1.0000 | ORAL_TABLET | ORAL | Status: DC | PRN

## 2015-11-13 NOTE — Discharge Summary (Signed)
Physician Discharge Summary  [name] MRN: [id number] DOB/AGE: [birth year] 55 y.o. Primary Care Physician:Carlisha Wisler, MD Admit date: 11/07/2015 Discharge date: 11/13/2015    Discharge Diagnoses:   Principal Problem:   DKA (diabetic ketoacidoses) ([place]) Active Problems:   Nausea with vomiting   Abdominal pain, generalized   Acute URI   Depression   Hypothyroidism   Hyperglycemia Influenza A    Medication List    STOP taking these medications        pantoprazole 40 MG tablet  Commonly known as:  PROTONIX      TAKE these medications        albuterol 108 (90 Base) MCG/ACT inhaler  Commonly known as:  PROVENTIL HFA;VENTOLIN HFA  Inhale 2 puffs into the lungs every 6 (six) hours as needed. For shortness of breath     benzonatate 100 MG capsule  Commonly known as:  TESSALON  Take 1 capsule (100 mg total) by mouth 3 (three) times daily.     fluticasone 50 MCG/ACT nasal spray  Commonly known as:  FLONASE  Place 2 sprays into both nostrils daily as needed for allergies or rhinitis.     gabapentin 100 MG capsule  Commonly known as:  NEURONTIN  Take 200 mg by mouth 4 (four) times daily.     HYDROcodone-acetaminophen 5-325 MG tablet  Commonly known as:  NORCO/VICODIN  Take 1-2 tablets by mouth every 4 (four) hours as needed for moderate pain.     Insulin Glargine 100 UNIT/ML Solostar Pen  Commonly known as:  LANTUS SOLOSTAR  Inject 50 Units into the skin at bedtime.     insulin lispro 100 UNIT/ML injection  Commonly known as:  HUMALOG  Inject 5-10 Units into the skin 3 (three) times daily as needed for high blood sugar (Uses 3 timesdaily according to sliding scale).     levothyroxine 137 MCG tablet  Commonly known as:  SYNTHROID, LEVOTHROID  Take 1 tablet (137 mcg total) by mouth every morning. For thyroid hormone replacement     Linaclotide 290 MCG Caps capsule  Commonly known as:  LINZESS  Take 1 capsule (290 mcg total) by mouth daily.      lisinopril 20 MG tablet  Commonly known as:  PRINIVIL,ZESTRIL  Take 1 tablet (20 mg total) by mouth daily.     omeprazole 40 MG capsule  Commonly known as:  PRILOSEC  Take 40 mg by mouth daily.     ondansetron 4 MG tablet  Commonly known as:  ZOFRAN  TAKE ONE TABLET BY MOUTH EVERY FOUR HOURS AS NEEDED FOR NAUSEA OR VOMITING.     oseltamivir 75 MG capsule  Commonly known as:  TAMIFLU  Take 1 capsule (75 mg total) by mouth 2 (two) times daily.     traMADol 50 MG tablet  Commonly known as:  ULTRAM  Take 50 mg by mouth 2 (two) times daily as needed for moderate pain.     traZODone 100 MG tablet  Commonly known as:  DESYREL  Take 200 mg by mouth 2 (two) times daily.        Discharged Condition: improved    Consults: endocrine pending  Significant Diagnostic Studies: Dg Abd Acute W/chest  11/07/2015  CLINICAL DATA:  55 year old female with hyperglycemia, nausea, vomiting, diarrhea and dry cough as well as generalized weakness EXAM: DG ABDOMEN ACUTE W/ 1V CHEST COMPARISON:  Prior chest x-ray 05/29/2014 FINDINGS: There is no evidence of dilated bowel loops or free intraperitoneal air. No radiopaque calculi  or other significant radiographic abnormality is seen. Heart size and mediastinal contours are within normal limits. Both lungs are clear. IMPRESSION: Negative abdominal radiographs.  No acute cardiopulmonary disease. Electronically Signed   By: Jacqulynn Cadet M.D.   On: 11/07/2015 22:45    Lab Results: Basic Metabolic Panel:  Recent Labs  11/13/15 0633  NA 138  K 5.4*  CL 100*  CO2 30  GLUCOSE 378*  BUN 13  CREATININE 0.80  CALCIUM 9.2   Liver Function Tests: No results for input(s): AST, ALT, ALKPHOS, BILITOT, PROT, ALBUMIN in the last 72 hours.   CBC:  Recent Labs  11/13/15 0633  WBC 6.3  HGB 13.3  HCT 40.1  MCV 92.6  PLT 241    Recent Results (from the past 240 hour(s))  Urine culture     Status: None   Collection Time: 11/08/15  7:40 AM   Result Value Ref Range Status   Specimen Description URINE, CLEAN CATCH  Final   Special Requests NONE  Final   Culture   Final    MULTIPLE SPECIES PRESENT, SUGGEST RECOLLECTION Performed at [facility]    Report Status 11/09/2015 FINAL  Final  Respiratory virus panel     Status: Abnormal   Collection Time: 11/08/15 10:35 AM  Result Value Ref Range Status   Respiratory Syncytial Virus A Negative Negative Final   Respiratory Syncytial Virus B Negative Negative Final   Influenza A Positive (A) Negative Final    Comment: Subtype: H3   Influenza B Negative Negative Final   Parainfluenza 1 Negative Negative Final   Parainfluenza 2 Negative Negative Final   Parainfluenza 3 Negative Negative Final   Metapneumovirus Negative Negative Final   Rhinovirus Negative Negative Final   Adenovirus Negative Negative Final    Comment: (NOTE) Performed At: [facility] [address] [place], [place] [id number] [place] [id number]      Hospital Course:   This is a 55 years old female with history of multiple medical illnesses who was admitted due to mild DKA. Patient was admitted to ICU and received fluid and IV insulin. Her blood sugar improved. Patient had cough, congestions and muscle aches. Patient was treated with nebulizer due history of bronchitis and tobacco smoking. Patient was also found to be positive for influenza A. She was started on Tamiflu. Patient feels better and being discharged in stable condition.  Discharge Exam: Blood pressure 108/52, pulse 80, temperature 97.7 F (36.5 C), temperature source Oral, resp. rate 18, height 5\' 4"  (1.626 m), weight 68.3 kg (150 lb 9.2 oz), SpO2 95 %.    Disposition:  Home.        Follow-up Information    Follow up with [facility], MD In 1 week.   Specialty:  Internal Medicine   Contact information:   [place] [address] [phone]        Signed: Rosita Fire   11/13/2015, 8:03 AM

## 2015-11-13 NOTE — Progress Notes (Signed)
Patient states understanding of discharge instructions, prescription given. 

## 2015-11-20 ENCOUNTER — Other Ambulatory Visit: Payer: Self-pay | Admitting: Internal Medicine

## 2016-01-29 DIAGNOSIS — E039 Hypothyroidism, unspecified: Secondary | ICD-10-CM | POA: Diagnosis not present

## 2016-01-29 DIAGNOSIS — J449 Chronic obstructive pulmonary disease, unspecified: Secondary | ICD-10-CM | POA: Diagnosis not present

## 2016-01-29 DIAGNOSIS — F339 Major depressive disorder, recurrent, unspecified: Secondary | ICD-10-CM | POA: Diagnosis not present

## 2016-01-29 DIAGNOSIS — E1142 Type 2 diabetes mellitus with diabetic polyneuropathy: Secondary | ICD-10-CM | POA: Diagnosis not present

## 2016-04-30 ENCOUNTER — Encounter: Payer: Self-pay | Admitting: Nurse Practitioner

## 2016-04-30 ENCOUNTER — Telehealth: Payer: Self-pay | Admitting: Nurse Practitioner

## 2016-04-30 ENCOUNTER — Ambulatory Visit: Payer: Self-pay | Admitting: Nurse Practitioner

## 2016-04-30 NOTE — Telephone Encounter (Signed)
PT WAS A NO SHOW AND LETTER SENT  °

## 2016-04-30 NOTE — Telephone Encounter (Signed)
Noted  

## 2016-05-13 ENCOUNTER — Other Ambulatory Visit (HOSPITAL_COMMUNITY): Payer: Self-pay | Admitting: Internal Medicine

## 2016-05-13 DIAGNOSIS — F419 Anxiety disorder, unspecified: Secondary | ICD-10-CM | POA: Diagnosis not present

## 2016-05-13 DIAGNOSIS — J449 Chronic obstructive pulmonary disease, unspecified: Secondary | ICD-10-CM | POA: Diagnosis not present

## 2016-05-13 DIAGNOSIS — R5381 Other malaise: Secondary | ICD-10-CM | POA: Diagnosis not present

## 2016-05-13 DIAGNOSIS — E1142 Type 2 diabetes mellitus with diabetic polyneuropathy: Secondary | ICD-10-CM | POA: Diagnosis not present

## 2016-05-13 DIAGNOSIS — N6320 Unspecified lump in the left breast, unspecified quadrant: Secondary | ICD-10-CM

## 2016-05-13 DIAGNOSIS — R921 Mammographic calcification found on diagnostic imaging of breast: Secondary | ICD-10-CM

## 2016-05-13 DIAGNOSIS — E785 Hyperlipidemia, unspecified: Secondary | ICD-10-CM | POA: Diagnosis not present

## 2016-05-13 DIAGNOSIS — K219 Gastro-esophageal reflux disease without esophagitis: Secondary | ICD-10-CM | POA: Diagnosis not present

## 2016-05-13 DIAGNOSIS — R928 Other abnormal and inconclusive findings on diagnostic imaging of breast: Secondary | ICD-10-CM

## 2016-05-13 DIAGNOSIS — E1140 Type 2 diabetes mellitus with diabetic neuropathy, unspecified: Secondary | ICD-10-CM | POA: Diagnosis not present

## 2016-05-13 DIAGNOSIS — F17200 Nicotine dependence, unspecified, uncomplicated: Secondary | ICD-10-CM | POA: Diagnosis not present

## 2016-05-13 DIAGNOSIS — E039 Hypothyroidism, unspecified: Secondary | ICD-10-CM | POA: Diagnosis not present

## 2016-05-16 ENCOUNTER — Other Ambulatory Visit (HOSPITAL_COMMUNITY): Payer: Self-pay | Admitting: Internal Medicine

## 2016-05-16 DIAGNOSIS — N6320 Unspecified lump in the left breast, unspecified quadrant: Secondary | ICD-10-CM

## 2016-05-16 DIAGNOSIS — R921 Mammographic calcification found on diagnostic imaging of breast: Secondary | ICD-10-CM

## 2016-05-16 DIAGNOSIS — R928 Other abnormal and inconclusive findings on diagnostic imaging of breast: Secondary | ICD-10-CM

## 2016-05-20 ENCOUNTER — Encounter (HOSPITAL_COMMUNITY): Payer: Self-pay

## 2016-05-21 ENCOUNTER — Emergency Department (HOSPITAL_COMMUNITY)
Admission: EM | Admit: 2016-05-21 | Discharge: 2016-05-21 | Disposition: A | Discharge status: Home / Self Care | Payer: Medicare Other | Attending: Emergency Medicine | Admitting: Emergency Medicine

## 2016-05-21 ENCOUNTER — Encounter (HOSPITAL_COMMUNITY): Payer: Self-pay

## 2016-05-21 DIAGNOSIS — J449 Chronic obstructive pulmonary disease, unspecified: Secondary | ICD-10-CM | POA: Diagnosis not present

## 2016-05-21 DIAGNOSIS — Z79899 Other long term (current) drug therapy: Secondary | ICD-10-CM | POA: Diagnosis not present

## 2016-05-21 DIAGNOSIS — L02415 Cutaneous abscess of right lower limb: Secondary | ICD-10-CM | POA: Diagnosis not present

## 2016-05-21 DIAGNOSIS — I10 Essential (primary) hypertension: Secondary | ICD-10-CM | POA: Diagnosis not present

## 2016-05-21 DIAGNOSIS — E109 Type 1 diabetes mellitus without complications: Secondary | ICD-10-CM | POA: Diagnosis not present

## 2016-05-21 DIAGNOSIS — E039 Hypothyroidism, unspecified: Secondary | ICD-10-CM | POA: Insufficient documentation

## 2016-05-21 DIAGNOSIS — F17210 Nicotine dependence, cigarettes, uncomplicated: Secondary | ICD-10-CM | POA: Insufficient documentation

## 2016-05-21 DIAGNOSIS — M5489 Other dorsalgia: Secondary | ICD-10-CM | POA: Diagnosis not present

## 2016-05-21 DIAGNOSIS — L988 Other specified disorders of the skin and subcutaneous tissue: Secondary | ICD-10-CM | POA: Diagnosis present

## 2016-05-21 DIAGNOSIS — L02215 Cutaneous abscess of perineum: Secondary | ICD-10-CM | POA: Insufficient documentation

## 2016-05-21 DIAGNOSIS — R52 Pain, unspecified: Secondary | ICD-10-CM | POA: Diagnosis not present

## 2016-05-21 LAB — CBG MONITORING, ED: Glucose-Capillary: 117 mg/dL — ABNORMAL HIGH (ref 65–99)

## 2016-05-21 MED ORDER — OXYCODONE-ACETAMINOPHEN 5-325 MG PO TABS
1.0000 | ORAL_TABLET | Freq: Once | ORAL | Status: AC
  Administered 2016-05-21: 1 via ORAL
  Filled 2016-05-21: qty 1

## 2016-05-21 MED ORDER — ONDANSETRON 4 MG PO TBDP
ORAL_TABLET | ORAL | Status: AC
  Administered 2016-05-21: 4 mg
  Filled 2016-05-21: qty 1

## 2016-05-21 MED ORDER — TRAMADOL HCL 50 MG PO TABS: 50.0000 mg | ORAL_TABLET | Freq: Four times a day (QID) | ORAL | 0 refills | Status: DC | PRN

## 2016-05-21 MED ORDER — LIDOCAINE HCL (PF) 1 % IJ SOLN
5.0000 mL | Freq: Once | INTRAMUSCULAR | Status: AC
  Administered 2016-05-21: 5 mL
  Filled 2016-05-21: qty 5

## 2016-05-21 MED ORDER — POVIDONE-IODINE 10 % EX SOLN
CUTANEOUS | Status: AC
  Filled 2016-05-21: qty 118

## 2016-05-21 NOTE — ED Triage Notes (Signed)
Pt reports has abscess to r groin for the past week.  Pt says hurts to wear jeans and fell while trying to walk today due to the pain.  Pt c/o pain in mid to lower back.

## 2016-05-21 NOTE — ED Notes (Signed)
Pt reports has been having dizziness and blurred vision for the past few months as well and is seeing Dr. Legrand Rams.

## 2016-06-01 NOTE — ED Provider Notes (Signed)
[facility] DEPT Provider Note   CSN: [id number] Arrival date & time: 05/21/16  1426     History   Chief Complaint Chief Complaint  Patient presents with  . Abscess  . Back Pain    HPI [name] is a 55 y.o. female.  HPI   55 year old female with a painful lesion to her right groin. Slowly worsening over the past week or so. Worse with anything touching the area. No drainage. No fevers or chills. No intervention prior to arrival.  Past Medical History:  Diagnosis Date  . Anxiety   . BV (bacterial vaginosis) 05/23/2015  . Chronic abdominal pain   . COPD (chronic obstructive pulmonary disease) ([place])   . Diabetes mellitus    type I  . GERD (gastroesophageal reflux disease)   . Hiatal hernia   . Hyperlipidemia   . Hypertension   . Hypothyroid   . Polyneuropathy in diabetes ([place])   . PUD (peptic ulcer disease)    ? remote past, no reports from [place] or [facility], states possibly Dr. Laural Golden.   . Stroke ([place])   . Trichimoniasis   . Unspecified polyarthropathy or polyarthritis, site unspecified   . Vaginal discharge 05/23/2015    Patient Active Problem List   Diagnosis Date Noted  . Acute URI 11/08/2015  . Depression 11/08/2015  . Hypothyroidism 11/08/2015  . Hyperglycemia 11/08/2015  . History of colonic polyps   . Dysphagia, pharyngoesophageal phase   . Vaginal discharge 05/23/2015  . BV (bacterial vaginosis) 05/23/2015  . Abdominal pain, generalized 05/16/2015  . Constipation 05/16/2015  . Esophageal dysphagia 05/16/2015  . Fibula fracture 09/07/2014  . DKA (diabetic ketoacidoses) ([place]) 02/20/2014  . Nausea with vomiting 02/20/2014  . Bereavement due to life event 03/03/2012  . Cocaine dependence ([place]) 03/03/2012  . Epigastric pain 01/29/2012  . GERD (gastroesophageal reflux disease) 01/29/2012  . Hematemesis 01/29/2012  . Screening for colon cancer 01/29/2012  . MIXED HYPERLIPIDEMIA 03/26/2010  . SMOKER 03/26/2010  . DYSPNEA 03/26/2010  . CHEST  PAIN UNSPECIFIED 03/26/2010    Past Surgical History:  Procedure Laterality Date  . ABDOMINAL HYSTERECTOMY    . APPENDECTOMY    . BLADDER REPAIR    . COLONOSCOPY N/A 06/01/2015   [id number] hemorrhoid diminutive rectosigmoid otherwise normal  . ESOPHAGOGASTRODUODENOSCOPY     in remote past, unclear who performed. No op notes from [facility] or [facility] (pt thought Dr. Laural Golden)  . ESOPHAGOGASTRODUODENOSCOPY  02/12/2012   Dr. Gala Romney:  Normal esophagus status post 66 F dilation. Small hiatal hernia. mild chronic gastritis  . ESOPHAGOGASTRODUODENOSCOPY N/A 02/21/2014   [id number] exudative reflux esophagitis-and + Candida esophagitis.  Marland Kitchen ESOPHAGOGASTRODUODENOSCOPY N/A 06/01/2015   [id number] HH otherwise normal  . TONSILLECTOMY      OB History    Gravida Para Term Preterm AB Living   1 1       1    SAB TAB Ectopic Multiple Live Births                   Home Medications    Prior to Admission medications   Medication Sig Start Date End Date Taking? Authorizing Provider  albuterol (PROVENTIL HFA;VENTOLIN HFA) 108 (90 BASE) MCG/ACT inhaler Inhale 2 puffs into the lungs every 6 (six) hours as needed. For shortness of breath 03/08/12  Yes Encarnacion Slates, NP  fluticasone (FLONASE) 50 MCG/ACT nasal spray Place 2 sprays into both nostrils daily as needed for allergies or rhinitis.   Yes Historical Provider, MD  gabapentin (NEURONTIN) 100  MG capsule Take 200 mg by mouth 4 (four) times daily.    Yes Historical Provider, MD  Insulin Glargine (LANTUS SOLOSTAR) 100 UNIT/ML Solostar Pen Inject 50 Units into the skin at bedtime. Patient taking differently: Inject 40-50 Units into the skin at bedtime.  11/13/15  Yes Rosita Fire, MD  insulin lispro (HUMALOG) 100 UNIT/ML injection Inject 5-10 Units into the skin 3 (three) times daily as needed for high blood sugar (Uses 3 timesdaily according to sliding scale).   Yes Historical Provider, MD  levothyroxine (SYNTHROID, LEVOTHROID) 137 MCG tablet Take 1 tablet (137  mcg total) by mouth every morning. For thyroid hormone replacement 03/08/12  Yes Encarnacion Slates, NP  omeprazole (PRILOSEC) 40 MG capsule Take 40 mg by mouth daily.   Yes Historical Provider, MD  ondansetron (ZOFRAN) 4 MG tablet TAKE ONE TABLET BY MOUTH EVERY FOUR HOURS AS NEEDED FOR NAUSEA OR VOMITING. 11/21/15  Yes Annitta Needs, NP  traMADol (ULTRAM) 50 MG tablet Take 50 mg by mouth 2 (two) times daily as needed for moderate pain.  02/03/14  Yes Historical Provider, MD  traZODone (DESYREL) 100 MG tablet Take 200 mg by mouth 2 (two) times daily.   Yes Historical Provider, MD  Linaclotide (LINZESS) 290 MCG CAPS capsule Take 1 capsule (290 mcg total) by mouth daily. Patient not taking: Reported on 05/21/2016 05/16/15   Mahala Menghini, PA-C  traMADol (ULTRAM) 50 MG tablet Take 1 tablet (50 mg total) by mouth every 6 (six) hours as needed. 05/21/16   Virgel Manifold, MD    Family History Family History  Problem Relation Age of Onset  . Liver cancer Mother     remission for 2 years  . Cancer Mother     lung  . Dementia Father   . Heart attack Father   . Other Sister     liver trouble  . Diabetes Brother   . Hyperthyroidism Brother   . Dementia Maternal Grandmother   . Diabetes Paternal Grandmother   . Diabetes Paternal Grandfather   . Colon cancer Neg Hx     Social History Social History  Substance Use Topics  . Smoking status: Current Every Day Smoker    Packs/day: 0.50    Years: 30.00    Types: Cigarettes  . Smokeless tobacco: Never Used  . Alcohol use No     Allergies   Phenytoin; Sulfonamide derivatives; and Tetracyclines & related   Review of Systems Review of Systems  All systems reviewed and negative, other than as noted in HPI.  Physical Exam Updated Vital Signs BP 171/70 (BP Location: Left Arm)   Pulse 72   Temp 97.8 F (36.6 C) (Oral)   Resp 16   Ht 5\' 4"  ([id number]-[id number] m)   Wt 142 lb (64.4 kg)   SpO2 100%   BMI 24.37 kg/m   Physical Exam  Constitutional: She  appears well-developed and well-nourished. No distress.  HENT:  Head: Normocephalic and atraumatic.  Eyes: Conjunctivae are normal.  Neck: Neck supple.  Cardiovascular: Normal rate and regular rhythm.   No murmur heard. Pulmonary/Chest: Effort normal and breath sounds normal. No respiratory distress.  Abdominal: Soft. There is no tenderness.  Musculoskeletal: She exhibits no edema.  Neurological: She is alert.  Skin: Skin is warm and dry.  Chaperone present. There is a nickel sized lesion consistent with an abscess to the right medial/proximal thigh. Fluctuant. A chronic cellulitis. No drainage.  Psychiatric: She has a normal mood and affect.  Nursing note  and vitals reviewed.    ED Treatments / Results  Labs (all labs ordered are listed, but only abnormal results are displayed) Labs Reviewed  CBG MONITORING, ED - Abnormal; Notable for the following:       Result Value   Glucose-Capillary 117 (*)    All other components within normal limits    EKG  EKG Interpretation  Date/Time:  Wednesday May 21 2016 14:36:35 EDT Ventricular Rate:  75 PR Interval:  140 QRS Duration: 70 QT Interval:  390 QTC Calculation: 435 R Axis:   49 Text Interpretation:  Normal sinus rhythm Low voltage QRS Abnormal ECG Confirmed by Wilson Singer  MD, [place] ([id number]) on 05/21/2016 4:36:07 PM       Radiology No results found.  Procedures Procedures (including critical care time)  INCISION AND DRAINAGE Performed by: Virgel Manifold Consent: Verbal consent obtained. Risks and benefits: risks, benefits and alternatives were discussed Type: abscess  Body area: R proximal thigh  Anesthesia: local infiltration  Incision was made with a scalpel.  Local anesthetic: lidocaine 1%  Anesthetic total: 1.5  ml  Complexity: complex Blunt dissection to break up loculations  Drainage: purulent  Drainage amount: moderate Packing material: none  Patient tolerance: Patient tolerated the procedure well  with no immediate complications.    Medications Ordered in ED Medications  lidocaine (PF) (XYLOCAINE) 1 % injection 5 mL (5 mLs Other Given by Other 05/21/16 1753)  oxyCODONE-acetaminophen (PERCOCET/ROXICET) 5-325 MG per tablet 1 tablet (1 tablet Oral Given 05/21/16 1655)  ondansetron (ZOFRAN-ODT) 4 MG disintegrating tablet (4 mg  Given 05/21/16 1655)     Initial Impression / Assessment and Plan / ED Course  I have reviewed the triage vital signs and the nursing notes.  Pertinent labs & imaging results that were available during my care of the patient were reviewed by me and considered in my medical decision making (see chart for details).  Clinical Course    55 year old female with an uncomplicated abscess to her right proximal thigh. This was incised and drained. No significant cellulitis or other complicating features. Continued wound care were discussed as well as return precautions.  Final Clinical Impressions(s) / ED Diagnoses   Final diagnoses:  Perineal abscess    New Prescriptions Discharge Medication List as of 05/21/2016  5:37 PM    START taking these medications   Details  !! traMADol (ULTRAM) 50 MG tablet Take 1 tablet (50 mg total) by mouth every 6 (six) hours as needed., Starting Wed 05/21/2016, Print     !! - Potential duplicate medications found. Please discuss with provider.       Virgel Manifold, MD 06/01/16 [phone]

## 2016-06-03 ENCOUNTER — Encounter (HOSPITAL_COMMUNITY): Payer: Self-pay

## 2016-06-10 ENCOUNTER — Other Ambulatory Visit: Payer: Self-pay | Admitting: Internal Medicine

## 2016-07-16 ENCOUNTER — Other Ambulatory Visit: Payer: Self-pay | Admitting: Nurse Practitioner

## 2016-07-16 NOTE — Telephone Encounter (Signed)
Patient needs OV prior to further refills. Please make sure if we are sending her a letter that we specify that is why she needs an OV.

## 2016-07-17 ENCOUNTER — Encounter: Payer: Self-pay | Admitting: Internal Medicine

## 2016-07-17 NOTE — Telephone Encounter (Signed)
Made appointment and mailed letter

## 2016-07-17 NOTE — Telephone Encounter (Signed)
Please schedule ov.  

## 2016-08-01 ENCOUNTER — Ambulatory Visit: Payer: Self-pay | Admitting: Gastroenterology

## 2016-08-21 ENCOUNTER — Other Ambulatory Visit: Payer: Self-pay | Admitting: Gastroenterology

## 2016-08-21 ENCOUNTER — Telehealth: Payer: Self-pay | Admitting: Internal Medicine

## 2016-08-21 MED ORDER — ONDANSETRON HCL 4 MG PO TABS: ORAL_TABLET | ORAL | 0 refills | Status: DC

## 2016-08-21 NOTE — Telephone Encounter (Signed)
Done

## 2016-08-21 NOTE — Telephone Encounter (Signed)
Routing to the refill box. 

## 2016-08-21 NOTE — Telephone Encounter (Signed)
Pt called to verify her appt date and time. Then she asked if she could get a refill to hold her until her appt. She said she needed zofran called into [facility].

## 2016-09-18 DIAGNOSIS — J441 Chronic obstructive pulmonary disease with (acute) exacerbation: Secondary | ICD-10-CM | POA: Diagnosis not present

## 2016-09-18 DIAGNOSIS — E1142 Type 2 diabetes mellitus with diabetic polyneuropathy: Secondary | ICD-10-CM | POA: Diagnosis not present

## 2016-10-01 ENCOUNTER — Ambulatory Visit: Payer: Medicare Other | Admitting: Gastroenterology

## 2016-10-07 ENCOUNTER — Encounter: Payer: Medicare Other | Admitting: Adult Health

## 2016-10-14 ENCOUNTER — Encounter: Payer: Medicare Other | Admitting: Adult Health

## 2016-10-22 ENCOUNTER — Ambulatory Visit: Payer: Medicare Other | Admitting: Gastroenterology

## 2016-10-22 ENCOUNTER — Telehealth: Payer: Self-pay | Admitting: Gastroenterology

## 2016-10-22 NOTE — Telephone Encounter (Signed)
Pt was a no show

## 2016-11-24 ENCOUNTER — Ambulatory Visit: Payer: Medicare Other | Admitting: Adult Health

## 2016-12-18 ENCOUNTER — Emergency Department (HOSPITAL_COMMUNITY): Payer: Medicare Other

## 2016-12-18 ENCOUNTER — Emergency Department (HOSPITAL_COMMUNITY)
Admission: EM | Admit: 2016-12-18 | Discharge: 2016-12-18 | Disposition: A | Discharge status: Home / Self Care | Payer: Medicare Other | Attending: Emergency Medicine | Admitting: Emergency Medicine

## 2016-12-18 ENCOUNTER — Encounter (HOSPITAL_COMMUNITY): Payer: Self-pay

## 2016-12-18 DIAGNOSIS — J449 Chronic obstructive pulmonary disease, unspecified: Secondary | ICD-10-CM | POA: Insufficient documentation

## 2016-12-18 DIAGNOSIS — I10 Essential (primary) hypertension: Secondary | ICD-10-CM | POA: Insufficient documentation

## 2016-12-18 DIAGNOSIS — F17210 Nicotine dependence, cigarettes, uncomplicated: Secondary | ICD-10-CM | POA: Diagnosis not present

## 2016-12-18 DIAGNOSIS — E039 Hypothyroidism, unspecified: Secondary | ICD-10-CM | POA: Diagnosis not present

## 2016-12-18 DIAGNOSIS — E162 Hypoglycemia, unspecified: Secondary | ICD-10-CM

## 2016-12-18 DIAGNOSIS — Z79899 Other long term (current) drug therapy: Secondary | ICD-10-CM | POA: Diagnosis not present

## 2016-12-18 DIAGNOSIS — Z794 Long term (current) use of insulin: Secondary | ICD-10-CM | POA: Insufficient documentation

## 2016-12-18 DIAGNOSIS — E10649 Type 1 diabetes mellitus with hypoglycemia without coma: Secondary | ICD-10-CM | POA: Diagnosis not present

## 2016-12-18 DIAGNOSIS — R0789 Other chest pain: Secondary | ICD-10-CM | POA: Diagnosis not present

## 2016-12-18 DIAGNOSIS — R05 Cough: Secondary | ICD-10-CM | POA: Diagnosis not present

## 2016-12-18 DIAGNOSIS — R069 Unspecified abnormalities of breathing: Secondary | ICD-10-CM | POA: Diagnosis not present

## 2016-12-18 DIAGNOSIS — J209 Acute bronchitis, unspecified: Secondary | ICD-10-CM | POA: Diagnosis not present

## 2016-12-18 DIAGNOSIS — R0602 Shortness of breath: Secondary | ICD-10-CM | POA: Diagnosis not present

## 2016-12-18 DIAGNOSIS — J40 Bronchitis, not specified as acute or chronic: Secondary | ICD-10-CM | POA: Insufficient documentation

## 2016-12-18 LAB — COMPREHENSIVE METABOLIC PANEL
ALT: 9 U/L — ABNORMAL LOW (ref 14–54)
AST: 18 U/L (ref 15–41)
Albumin: 3.7 g/dL (ref 3.5–5.0)
Alkaline Phosphatase: 93 U/L (ref 38–126)
Anion gap: 8 (ref 5–15)
BUN: 9 mg/dL (ref 6–20)
CHLORIDE: 105 mmol/L (ref 101–111)
CO2: 27 mmol/L (ref 22–32)
Calcium: 9.1 mg/dL (ref 8.9–10.3)
Creatinine, Ser: 0.61 mg/dL (ref 0.44–1.00)
Glucose, Bld: 51 mg/dL — ABNORMAL LOW (ref 65–99)
POTASSIUM: 3.2 mmol/L — AB (ref 3.5–5.1)
Sodium: 140 mmol/L (ref 135–145)
Total Bilirubin: 0.4 mg/dL (ref 0.3–1.2)
Total Protein: 7.3 g/dL (ref 6.5–8.1)

## 2016-12-18 LAB — CBC WITH DIFFERENTIAL/PLATELET
Basophils Absolute: 0 10*3/uL (ref 0.0–0.1)
Basophils Relative: 0 %
Eosinophils Absolute: 0.3 10*3/uL (ref 0.0–0.7)
Eosinophils Relative: 2 %
HEMATOCRIT: 39.4 % (ref 36.0–46.0)
HEMOGLOBIN: 13.7 g/dL (ref 12.0–15.0)
Lymphocytes Relative: 28 %
Lymphs Abs: 3.3 10*3/uL (ref 0.7–4.0)
MCH: 31.6 pg (ref 26.0–34.0)
MCHC: 34.8 g/dL (ref 30.0–36.0)
MCV: 91 fL (ref 78.0–100.0)
MONO ABS: 1.2 10*3/uL — AB (ref 0.1–1.0)
MONOS PCT: 10 %
NEUTROS ABS: 7.1 10*3/uL (ref 1.7–7.7)
NEUTROS PCT: 60 %
Platelets: 278 10*3/uL (ref 150–400)
RBC: 4.33 MIL/uL (ref 3.87–5.11)
RDW: 13.4 % (ref 11.5–15.5)
WBC: 12 10*3/uL — ABNORMAL HIGH (ref 4.0–10.5)

## 2016-12-18 LAB — TROPONIN I: Troponin I: <0.03 ng/mL (ref <0.03)

## 2016-12-18 LAB — CBG MONITORING, ED: Glucose-Capillary: 255 mg/dL — ABNORMAL HIGH (ref 65–99)

## 2016-12-18 LAB — BRAIN NATRIURETIC PEPTIDE: B NATRIURETIC PEPTIDE 5: 21 pg/mL (ref 0.0–100.0)

## 2016-12-18 MED ORDER — AZITHROMYCIN 250 MG PO TABS
500.0000 mg | ORAL_TABLET | Freq: Once | ORAL | Status: AC
  Administered 2016-12-18: 500 mg via ORAL
  Filled 2016-12-18: qty 2

## 2016-12-18 MED ORDER — SODIUM CHLORIDE 0.9 % IV BOLUS (SEPSIS)
1000.0000 mL | Freq: Once | INTRAVENOUS | Status: AC
  Administered 2016-12-18: 1000 mL via INTRAVENOUS

## 2016-12-18 MED ORDER — AZITHROMYCIN 250 MG PO TABS: ORAL_TABLET | ORAL | 0 refills | Status: DC

## 2016-12-18 MED ORDER — DEXTROSE 50 % IV SOLN
INTRAVENOUS | Status: AC
  Filled 2016-12-18: qty 50

## 2016-12-18 MED ORDER — DEXTROSE 50 % IV SOLN
50.0000 mL | Freq: Once | INTRAVENOUS | Status: AC
  Administered 2016-12-18: 50 mL via INTRAVENOUS

## 2016-12-18 MED ORDER — KETOROLAC TROMETHAMINE 30 MG/ML IJ SOLN
15.0000 mg | Freq: Once | INTRAMUSCULAR | Status: AC
  Administered 2016-12-18: 15 mg via INTRAVENOUS
  Filled 2016-12-18: qty 1

## 2016-12-18 MED ORDER — IPRATROPIUM-ALBUTEROL 0.5-2.5 (3) MG/3ML IN SOLN
3.0000 mL | Freq: Once | RESPIRATORY_TRACT | Status: AC
  Administered 2016-12-18: 3 mL via RESPIRATORY_TRACT
  Filled 2016-12-18: qty 3

## 2016-12-18 MED ORDER — BENZONATATE 100 MG PO CAPS: 100.0000 mg | ORAL_CAPSULE | Freq: Three times a day (TID) | ORAL | 0 refills | Status: DC

## 2016-12-18 MED ORDER — ALBUTEROL SULFATE HFA 108 (90 BASE) MCG/ACT IN AERS: 2.0000 | INHALATION_SPRAY | Freq: Four times a day (QID) | RESPIRATORY_TRACT | 0 refills | Status: AC | PRN

## 2016-12-18 MED ORDER — ONDANSETRON HCL 4 MG/2ML IJ SOLN
4.0000 mg | Freq: Once | INTRAMUSCULAR | Status: AC
  Administered 2016-12-18: 4 mg via INTRAVENOUS
  Filled 2016-12-18: qty 2

## 2016-12-18 NOTE — ED Triage Notes (Signed)
Pt states she has been coughing for the past 4 days and bringing up green.  Pt denies cp, but states her head hurts from coughing

## 2016-12-18 NOTE — Discharge Instructions (Signed)
Take the antibiotics as prescribed. Use your nebulizer every 4 hours for the next 3 days and then every 4 hours as needed. Stop smoking. Watch your blood sugars carefully. Followup with Dr. Legrand Rams. Return to the ED if you develop new or worsening symptoms.

## 2016-12-18 NOTE — ED Notes (Signed)
Patient ambulated around nurses station. Patient maintained 96% on room air with an 86 HR. Patient states she didn't feel good and began getting anxious when getting back to bed.

## 2016-12-18 NOTE — ED Notes (Signed)
Pt given 8oz of orange juice 

## 2016-12-18 NOTE — ED Provider Notes (Signed)
[facility] DEPT Provider Note   CSN: [id number] Arrival date & time: 12/18/16  0444     History   Chief Complaint Chief Complaint  Patient presents with  . Shortness of Breath    HPI [name] is a 56 y.o. female.  Patient with history of COPD, diabetes, previous stroke presenting with four-day history of coughing, shortness of breath and green sputum production. Did not receive a flu shot this season. States febrile to 100 at home. Has had sick contacts at home. She's been using her nebulizer without relief. She has chest pain and back pain from coughing. Denies any vomiting but has had nausea. Denies any leg pain or leg swelling. No abdominal pain or diarrhea. Called EMS this morning because she was very short of breath and coughing nonstop. Denies any cardiac history.   The history is provided by the patient and the EMS personnel.  Shortness of Breath  Associated symptoms include a fever, rhinorrhea and cough. Pertinent negatives include no chest pain, no vomiting and no abdominal pain.    Past Medical History:  Diagnosis Date  . Anxiety   . BV (bacterial vaginosis) 05/23/2015  . Chronic abdominal pain   . COPD (chronic obstructive pulmonary disease) ([place])   . Diabetes mellitus    type I  . GERD (gastroesophageal reflux disease)   . Hiatal hernia   . Hyperlipidemia   . Hypertension   . Hypothyroid   . Polyneuropathy in diabetes ([place])   . PUD (peptic ulcer disease)    ? remote past, no reports from [facility] or [facility], states possibly Dr. Laural Golden.   . Stroke ([place])   . Trichimoniasis   . Unspecified polyarthropathy or polyarthritis, site unspecified   . Vaginal discharge 05/23/2015    Patient Active Problem List   Diagnosis Date Noted  . Acute URI 11/08/2015  . Depression 11/08/2015  . Hypothyroidism 11/08/2015  . Hyperglycemia 11/08/2015  . History of colonic polyps   . Dysphagia, pharyngoesophageal phase   . Vaginal discharge 05/23/2015  . BV  (bacterial vaginosis) 05/23/2015  . Abdominal pain, generalized 05/16/2015  . Constipation 05/16/2015  . Esophageal dysphagia 05/16/2015  . Fibula fracture 09/07/2014  . DKA (diabetic ketoacidoses) ([place]) 02/20/2014  . Nausea with vomiting 02/20/2014  . Bereavement due to life event 03/03/2012  . Cocaine dependence ([place]) 03/03/2012  . Epigastric pain 01/29/2012  . GERD (gastroesophageal reflux disease) 01/29/2012  . Hematemesis 01/29/2012  . Screening for colon cancer 01/29/2012  . MIXED HYPERLIPIDEMIA 03/26/2010  . SMOKER 03/26/2010  . DYSPNEA 03/26/2010  . CHEST PAIN UNSPECIFIED 03/26/2010    Past Surgical History:  Procedure Laterality Date  . ABDOMINAL HYSTERECTOMY    . APPENDECTOMY    . BLADDER REPAIR    . COLONOSCOPY N/A 06/01/2015   YBW:LSLHTDSK hemorrhoid diminutive rectosigmoid otherwise normal  . ESOPHAGOGASTRODUODENOSCOPY     in remote past, unclear who performed. No op notes from [facility] or [facility] (pt thought Dr. Laural Golden)  . ESOPHAGOGASTRODUODENOSCOPY  02/12/2012   Dr. Gala Romney:  Normal esophagus status post 79 F dilation. Small hiatal hernia. mild chronic gastritis  . ESOPHAGOGASTRODUODENOSCOPY N/A 02/21/2014   AJG:OTLXBW exudative reflux esophagitis-and + Candida esophagitis.  Marland Kitchen ESOPHAGOGASTRODUODENOSCOPY N/A 06/01/2015   IOM:BTDHR HH otherwise normal  . TONSILLECTOMY      OB History    Gravida Para Term Preterm AB Living   1 1       1    SAB TAB Ectopic Multiple Live Births  Home Medications    Prior to Admission medications   Medication Sig Start Date End Date Taking? Authorizing Provider  albuterol (PROVENTIL HFA;VENTOLIN HFA) 108 (90 BASE) MCG/ACT inhaler Inhale 2 puffs into the lungs every 6 (six) hours as needed. For shortness of breath 03/08/12   Encarnacion Slates, NP  fluticasone [facility]) 50 MCG/ACT nasal spray Place 2 sprays into both nostrils daily as needed for allergies or rhinitis.    Historical Provider, MD  gabapentin (NEURONTIN) 100 MG  capsule Take 200 mg by mouth 4 (four) times daily.     Historical Provider, MD  Insulin Glargine (LANTUS SOLOSTAR) 100 UNIT/ML Solostar Pen Inject 50 Units into the skin at bedtime. Patient taking differently: Inject 40-50 Units into the skin at bedtime.  11/13/15   Rosita Fire, MD  insulin lispro (HUMALOG) 100 UNIT/ML injection Inject 5-10 Units into the skin 3 (three) times daily as needed for high blood sugar (Uses 3 timesdaily according to sliding scale).    Historical Provider, MD  levothyroxine (SYNTHROID, LEVOTHROID) 137 MCG tablet Take 1 tablet (137 mcg total) by mouth every morning. For thyroid hormone replacement 03/08/12   Encarnacion Slates, NP  Linaclotide [facility]) 290 MCG CAPS capsule Take 1 capsule (290 mcg total) by mouth daily. Patient not taking: Reported on 05/21/2016 05/16/15   Mahala Menghini, PA-C  omeprazole (PRILOSEC) 40 MG capsule Take 40 mg by mouth daily.    Historical Provider, MD  ondansetron (ZOFRAN) 4 MG tablet TAKE ONE TABLET BY MOUTH EVERY FOUR HOURS AS NEEDED FOR NAUSEA OR VOMITING. 08/21/16   Annitta Needs, NP  traMADol (ULTRAM) 50 MG tablet Take 50 mg by mouth 2 (two) times daily as needed for moderate pain.  02/03/14   Historical Provider, MD  traMADol (ULTRAM) 50 MG tablet Take 1 tablet (50 mg total) by mouth every 6 (six) hours as needed. 05/21/16   Virgel Manifold, MD  traZODone (DESYREL) 100 MG tablet Take 200 mg by mouth 2 (two) times daily.    Historical Provider, MD    Family History Family History  Problem Relation Age of Onset  . Liver cancer Mother     remission for 2 years  . Cancer Mother     lung  . Dementia Father   . Heart attack Father   . Other Sister     liver trouble  . Diabetes Brother   . Hyperthyroidism Brother   . Dementia Maternal Grandmother   . Diabetes Paternal Grandmother   . Diabetes Paternal Grandfather   . Colon cancer Neg Hx     Social History Social History  Substance Use Topics  . Smoking status: Current Every Day Smoker      Packs/day: 0.50    Years: 30.00    Types: Cigarettes  . Smokeless tobacco: Never Used  . Alcohol use No     Allergies   Phenytoin; Sulfonamide derivatives; and Tetracyclines & related   Review of Systems Review of Systems  Constitutional: Positive for activity change, appetite change, fatigue and fever.  HENT: Positive for congestion and rhinorrhea.   Respiratory: Positive for cough, chest tightness and shortness of breath.   Cardiovascular: Negative for chest pain.  Gastrointestinal: Negative for abdominal pain, nausea and vomiting.  Genitourinary: Negative for dysuria and hematuria.  Musculoskeletal: Positive for arthralgias and myalgias.  Neurological: Positive for weakness.   A complete 10 system review of systems was obtained and all systems are negative except as noted in the HPI and PMH.  Physical Exam Updated Vital Signs BP 124/73 (BP Location: Right Arm)   Pulse 90   Temp 98.1 F (36.7 C) (Oral)   Resp (!) 24   Ht 5\' 4"  (1.626 m)   Wt 140 lb (63.5 kg)   SpO2 99%   BMI 24.03 kg/m   Physical Exam  Constitutional: She is oriented to person, place, and time. She appears well-developed and well-nourished. No distress.  Moist cough. Speaking in full sentences  HENT:  Head: Normocephalic and atraumatic.  Mouth/Throat: Oropharynx is clear and moist. No oropharyngeal exudate.  Eyes: Conjunctivae and EOM are normal. Pupils are equal, round, and reactive to light.  Neck: Normal range of motion. Neck supple.  No meningismus.  Cardiovascular: Normal rate, regular rhythm, normal heart sounds and intact distal pulses.   No murmur heard. Pulmonary/Chest: Effort normal. No respiratory distress.  Diminished at bases, scattered expiratory rhonchi  Abdominal: Soft. There is no tenderness. There is no rebound and no guarding.  Musculoskeletal: Normal range of motion. She exhibits no edema or tenderness.  Neurological: She is alert and oriented to person, place, and  time. No cranial nerve deficit. She exhibits normal muscle tone. Coordination normal.   5/5 strength throughout. CN 2-12 intact.Equal grip strength.   Skin: Skin is warm.  Psychiatric: She has a normal mood and affect. Her behavior is normal.  Nursing note and vitals reviewed.    ED Treatments / Results  Labs (all labs ordered are listed, but only abnormal results are displayed) Labs Reviewed  CBC WITH DIFFERENTIAL/PLATELET - Abnormal; Notable for the following:       Result Value   WBC 12.0 (*)    Monocytes Absolute 1.2 (*)    All other components within normal limits  COMPREHENSIVE METABOLIC PANEL - Abnormal; Notable for the following:    Potassium 3.2 (*)    Glucose, Bld 51 (*)    ALT 9 (*)    All other components within normal limits  CBG MONITORING, ED - Abnormal; Notable for the following:    Glucose-Capillary 255 (*)    All other components within normal limits  BRAIN NATRIURETIC PEPTIDE  TROPONIN I  CBG MONITORING, ED    EKG  EKG Interpretation  Date/Time:  Thursday December 18 2016 04:52:43 EDT Ventricular Rate:  91 PR Interval:    QRS Duration: 84 QT Interval:  377 QTC Calculation: 464 R Axis:   54 Text Interpretation:  Sinus rhythm Minimal ST depression, lateral leads Baseline wander in lead(s) V6 No significant change was found Confirmed by Wyvonnia Dusky  MD, Clennon Nasca [phone]) on 12/18/2016 5:23:28 AM       Radiology Dg Chest 2 View  Result Date: 12/18/2016 CLINICAL DATA:  Shortness of breath.  Productive cough. EXAM: CHEST  2 VIEW COMPARISON:  Chest from acute abdomen 11/07/2015 FINDINGS: The cardiomediastinal contours are normal. The lungs are clear. Pulmonary vasculature is normal. No consolidation, pleural effusion, or pneumothorax. No acute osseous abnormalities are seen. IMPRESSION: No acute abnormality. Electronically Signed   By: Jeb Levering M.D.   On: 12/18/2016 06:33    Procedures Procedures (including critical care time)  Medications Ordered in  ED Medications  ipratropium-albuterol (DUONEB) 0.5-2.5 (3) MG/3ML nebulizer solution 3 mL (not administered)     Initial Impression / Assessment and Plan / ED Course  I have reviewed the triage vital signs and the nursing notes.  Pertinent labs & imaging results that were available during my care of the patient were reviewed by me and considered in my  medical decision making (see chart for details).    Patient presents with a four-day history of coughing and shortness of breath. History of COPD she continues to smoke. Does not wear oxygen at home. Chest and back pain form coughing.   No wheezing on exam but does have scattered rhonchi. She is given nebulizers on arrival. We'll hold off on steroids at this time given her history of type 1 diabetes. Initial blood sugar was low at 51.  Patient did not eat this morning and was given a meal. Chest x-ray negative for pneumonia. Labs are reassuring. Patient ambulatory without desaturation.  CBG improving.  Will treat for bronchitis. Followup with PCP. Smoking cessation encouraged.  Anticipate discharge home if blood sugar remains stable and second troponin is negative. Dr. Lacinda Axon to assume care at shift change.   Final Clinical Impressions(s) / ED Diagnoses   Final diagnoses:  None    New Prescriptions New Prescriptions   No medications on file     Ezequiel Essex, MD 12/18/16 [phone]

## 2017-03-31 ENCOUNTER — Ambulatory Visit: Payer: Medicare Other | Admitting: Obstetrics & Gynecology

## 2017-04-09 ENCOUNTER — Ambulatory Visit: Payer: Medicare Other | Admitting: Advanced Practice Midwife

## 2017-05-07 ENCOUNTER — Other Ambulatory Visit (HOSPITAL_COMMUNITY): Payer: Self-pay | Admitting: Internal Medicine

## 2017-05-07 DIAGNOSIS — F419 Anxiety disorder, unspecified: Secondary | ICD-10-CM | POA: Diagnosis not present

## 2017-05-07 DIAGNOSIS — R5381 Other malaise: Secondary | ICD-10-CM | POA: Diagnosis not present

## 2017-05-07 DIAGNOSIS — E1142 Type 2 diabetes mellitus with diabetic polyneuropathy: Secondary | ICD-10-CM | POA: Diagnosis not present

## 2017-05-07 DIAGNOSIS — F17200 Nicotine dependence, unspecified, uncomplicated: Secondary | ICD-10-CM | POA: Diagnosis not present

## 2017-05-07 DIAGNOSIS — K219 Gastro-esophageal reflux disease without esophagitis: Secondary | ICD-10-CM | POA: Diagnosis not present

## 2017-05-07 DIAGNOSIS — F320 Major depressive disorder, single episode, mild: Secondary | ICD-10-CM | POA: Diagnosis not present

## 2017-05-07 DIAGNOSIS — R634 Abnormal weight loss: Secondary | ICD-10-CM | POA: Diagnosis not present

## 2017-05-07 DIAGNOSIS — R109 Unspecified abdominal pain: Secondary | ICD-10-CM

## 2017-05-07 DIAGNOSIS — J449 Chronic obstructive pulmonary disease, unspecified: Secondary | ICD-10-CM | POA: Diagnosis not present

## 2017-05-07 DIAGNOSIS — M130 Polyarthritis, unspecified: Secondary | ICD-10-CM | POA: Diagnosis not present

## 2017-05-07 DIAGNOSIS — E1140 Type 2 diabetes mellitus with diabetic neuropathy, unspecified: Secondary | ICD-10-CM | POA: Diagnosis not present

## 2017-05-07 DIAGNOSIS — Z1389 Encounter for screening for other disorder: Secondary | ICD-10-CM | POA: Diagnosis not present

## 2017-05-07 DIAGNOSIS — E039 Hypothyroidism, unspecified: Secondary | ICD-10-CM | POA: Diagnosis not present

## 2017-05-07 DIAGNOSIS — E785 Hyperlipidemia, unspecified: Secondary | ICD-10-CM | POA: Diagnosis not present

## 2017-05-13 ENCOUNTER — Ambulatory Visit (HOSPITAL_COMMUNITY): Admission: RE | Admit: 2017-05-13 | Payer: Medicare Other | Source: Ambulatory Visit

## 2017-05-19 DIAGNOSIS — J449 Chronic obstructive pulmonary disease, unspecified: Secondary | ICD-10-CM | POA: Diagnosis not present

## 2017-05-19 DIAGNOSIS — K219 Gastro-esophageal reflux disease without esophagitis: Secondary | ICD-10-CM | POA: Diagnosis not present

## 2017-05-19 DIAGNOSIS — E1142 Type 2 diabetes mellitus with diabetic polyneuropathy: Secondary | ICD-10-CM | POA: Diagnosis not present

## 2017-05-19 DIAGNOSIS — E039 Hypothyroidism, unspecified: Secondary | ICD-10-CM | POA: Diagnosis not present

## 2017-05-28 ENCOUNTER — Ambulatory Visit (HOSPITAL_COMMUNITY)
Admission: RE | Admit: 2017-05-28 | Discharge: 2017-05-28 | Disposition: A | Discharge status: Home / Self Care | Payer: Medicare Other | Source: Ambulatory Visit | Attending: Internal Medicine | Admitting: Internal Medicine

## 2017-05-28 ENCOUNTER — Other Ambulatory Visit (HOSPITAL_COMMUNITY): Payer: Self-pay | Admitting: Internal Medicine

## 2017-05-28 ENCOUNTER — Encounter: Payer: Self-pay | Admitting: Advanced Practice Midwife

## 2017-05-28 ENCOUNTER — Ambulatory Visit (INDEPENDENT_AMBULATORY_CARE_PROVIDER_SITE_OTHER): Payer: Medicare Other | Admitting: Advanced Practice Midwife

## 2017-05-28 VITALS — BP 136/78 | HR 59 | Ht 64.0 in | Wt 141.0 lb

## 2017-05-28 DIAGNOSIS — R109 Unspecified abdominal pain: Secondary | ICD-10-CM | POA: Diagnosis not present

## 2017-05-28 DIAGNOSIS — J449 Chronic obstructive pulmonary disease, unspecified: Secondary | ICD-10-CM | POA: Diagnosis not present

## 2017-05-28 DIAGNOSIS — A599 Trichomoniasis, unspecified: Secondary | ICD-10-CM | POA: Diagnosis not present

## 2017-05-28 DIAGNOSIS — R1084 Generalized abdominal pain: Secondary | ICD-10-CM | POA: Diagnosis not present

## 2017-05-28 DIAGNOSIS — R05 Cough: Secondary | ICD-10-CM | POA: Diagnosis not present

## 2017-05-28 MED ORDER — METRONIDAZOLE 500 MG PO TABS: 2000.0000 mg | ORAL_TABLET | Freq: Once | ORAL | 0 refills | Status: AC

## 2017-05-28 NOTE — Progress Notes (Signed)
[facility] Visit  Patient name: [name] MRN [id number]  Date of birth: 1961-07-14  CC & HPI:  [name] is a 56 y.o. CaucasianCaucasian female presenting today for vaginal discharge, off and on, for 6 months.  + itch, + irritation, + odor. Sometimes green.  Last sex > 1 year ago  Pertinent History Reviewed:  Medical & Surgical Hx:   Past Medical History:  Diagnosis Date  . Anxiety   . BV (bacterial vaginosis) 05/23/2015  . Chronic abdominal pain   . COPD (chronic obstructive pulmonary disease) ([place])   . Diabetes mellitus    type I  . GERD (gastroesophageal reflux disease)   . Hiatal hernia   . Hyperlipidemia   . Hypertension   . Hypothyroid   . Polyneuropathy in diabetes ([place])   . PUD (peptic ulcer disease)    ? remote past, no reports from [facility] or [facility], states possibly Dr. Laural Golden.   . Stroke ([place])   . Trichimoniasis   . Unspecified polyarthropathy or polyarthritis, site unspecified   . Vaginal discharge 05/23/2015   Past Surgical History:  Procedure Laterality Date  . ABDOMINAL HYSTERECTOMY    . APPENDECTOMY    . BLADDER REPAIR    . COLONOSCOPY N/A 06/01/2015   KWI:OXBDZHGD hemorrhoid diminutive rectosigmoid otherwise normal  . ESOPHAGOGASTRODUODENOSCOPY     in remote past, unclear who performed. No op notes from [facility] or [facility] (pt thought Dr. Laural Golden)  . ESOPHAGOGASTRODUODENOSCOPY  02/12/2012   Dr. Gala Romney:  Normal esophagus status post 66 F dilation. Small hiatal hernia. mild chronic gastritis  . ESOPHAGOGASTRODUODENOSCOPY N/A 02/21/2014   JME:QASTMH exudative reflux esophagitis-and + Candida esophagitis.  Marland Kitchen ESOPHAGOGASTRODUODENOSCOPY N/A 06/01/2015   DQQ:IWLNL HH otherwise normal  . TONSILLECTOMY     Family History  Problem Relation Age of Onset  . Liver cancer Mother        remission for 2 years  . Cancer Mother        lung  . Dementia Father   . Heart attack Father   . Other Sister        liver trouble  . Diabetes Brother   .  Hyperthyroidism Brother   . Dementia Maternal Grandmother   . Diabetes Paternal Grandmother   . Diabetes Paternal Grandfather   . Colon cancer Neg Hx     Current Outpatient Prescriptions:  .  albuterol (PROVENTIL HFA;VENTOLIN HFA) 108 (90 Base) MCG/ACT inhaler, Inhale 2 puffs into the lungs every 6 (six) hours as needed for wheezing or shortness of breath., Disp: 1 Inhaler, Rfl: 0 .  fluticasone (FLONASE) 50 MCG/ACT nasal spray, Place 2 sprays into both nostrils daily as needed for allergies or rhinitis., Disp: , Rfl:  .  gabapentin (NEURONTIN) 100 MG capsule, Take 200 mg by mouth 4 (four) times daily. , Disp: , Rfl:  .  Insulin Glargine (LANTUS SOLOSTAR) 100 UNIT/ML Solostar Pen, Inject 50 Units into the skin at bedtime. (Patient taking differently: Inject 40-50 Units into the skin at bedtime. ), Disp: 15 mL, Rfl: 11 .  insulin lispro (HUMALOG) 100 UNIT/ML injection, Inject 5-10 Units into the skin 3 (three) times daily as needed for high blood sugar (Uses 3 timesdaily according to sliding scale)., Disp: , Rfl:  .  levothyroxine (SYNTHROID, LEVOTHROID) 150 MCG tablet, Take 150 mcg by mouth daily before breakfast., Disp: , Rfl:  .  omeprazole (PRILOSEC) 40 MG capsule, Take 40 mg by mouth daily., Disp: , Rfl:  .  ondansetron (ZOFRAN) 4  MG tablet, TAKE ONE TABLET BY MOUTH EVERY FOUR HOURS AS NEEDED FOR NAUSEA OR VOMITING., Disp: 30 tablet, Rfl: 0 .  simvastatin (ZOCOR) 20 MG tablet, Take 20 mg by mouth daily., Disp: , Rfl:  .  traMADol (ULTRAM) 50 MG tablet, Take 50 mg by mouth 2 (two) times daily as needed for moderate pain. , Disp: , Rfl:  .  traZODone (DESYREL) 100 MG tablet, Take 200 mg by mouth 2 (two) times daily., Disp: , Rfl:  .  metroNIDAZOLE (FLAGYL) 500 MG tablet, Take 4 tablets (2,000 mg total) by mouth once., Disp: 4 tablet, Rfl: 0 Social History: Reviewed -  reports that she has been smoking Cigarettes.  She has been smoking about 0.00 packs per day for the past 34.00 years. She has  never used smokeless tobacco.  Review of Systems:   Constitutional: Negative for fever and chills Eyes: Negative for visual disturbances Respiratory:COPD sx Cardiovascular: Negative for chest pain or palpitations  Gastrointestinal: Negative for vomiting, diarrhea and constipation; no abdominal pain Genitourinary: Negative for dysuria and urgency, Neurological: Negative for dizziness and headaches    Objective Findings:    Physical Examination: General appearance - well appearing, and in no distress Mental status - alert, oriented to person, place, and time Chest:  Normal respiratory effort Heart - normal rate and regular rhythm Abdomen:  Soft, nontender Pelvic: SSE:  Vagina red, discharge scant, green  Wet prep + TNTC WBC and + TRICH Extremities:  No edema    No results found for this or any previous visit (from the past 24 hour(s)).    Assessment & Plan:  A:   trichomonas P:  Flagyl 2gm   Return for If you have any problems.  CRESENZO-DISHMAN,Shanya Ferriss CNM 05/28/2017 6:29 PM

## 2017-05-28 NOTE — Patient Instructions (Signed)
Trichinosis Trichinosis is a rare infection that is caused by a parasitic worm. People can become infected by eating the raw or undercooked meat of an animal that is infected with this worm. Bear meat and pig meat are possible sources of this infection. The severity of the illness will vary depending on the number of worms in the contaminated meat that is eaten. After the worms are eaten, they enter the intestines and can cause symptoms. From there, the worms travel through blood vessels to other areas of the body. When worms reach the muscles, a cyst forms around the worms. The illness can range from mild to severe. In mild to moderate cases, most symptoms will go away in a few months. The infection can cause serious health problems if the worms heavily invade the heart, the lungs, or the brain and spinal cord (nervous system). What are the causes? Trichinosis is caused by a type of parasitic worm that is called Trichinella spiralis. In infected meat, immature worms (larvae) are contained within small cysts. People can only get infected by eating infected meat that is not cooked well enough. When the cysts reach the intestines, the larvae are released. What are the signs or symptoms? Early symptoms occur when the larvae are in the intestine. These symptoms usually occur 1-2 days after eating infected meat, and they may include:  Diarrhea.  Abdominal pain.  Nausea and vomiting.  In 7-10 days, other symptoms may develop, such as:  Swelling of your face and swelling around your eyes.  Muscle aches and pain.  Fatigue.  Weakness.  Headache.  Fever.  Chills.  Shortness of breath.  Itchy skin.  How is this diagnosed? Your health care provider will take your medical history and do a physical exam. Tests can be done to help confirm the diagnosis. Tests may include:  Blood tests.  Taking a sample of muscle tissue (biopsy) to be examined under a microscope.  How is this treated? Your  health care provider may prescribe medicines to kill any worms that are still in your intestines. Other treatments will focus on relieving your symptoms. Various medicines may be used, depending on your symptoms. Follow these instructions at home:  Rest as much as possible until you feel better.  Take medicines only as directed by your health care provider.  Drink enough fluid to keep your urine clear or pale yellow.  Maintain proper nutrition. Eating frequent small meals may help with this. How is this prevented? Do not eat meat that is raw or undercooked. This precaution will prevent any trichinosis infection in the future. Cooking meat properly will kill any worms that are in infected meat. Contact a health care provider if:  Your symptoms get worse.  You have pain that is not controlled with medicine.  You have a fever. Get help right away if:  You develop a severe headache.  You have a stiff or painful neck.  You have chest pain.  You have an irregular heartbeat.  You have shortness of breath or trouble breathing. This information is not intended to replace advice given to you by your health care provider. Make sure you discuss any questions you have with your health care provider. Document Released: 12/15/2000 Document Revised: 04/18/2016 Document Reviewed: 04/13/2014 Elsevier Interactive Patient Education  [employer].

## 2017-06-02 ENCOUNTER — Ambulatory Visit (INDEPENDENT_AMBULATORY_CARE_PROVIDER_SITE_OTHER): Payer: Medicare Other | Admitting: General Surgery

## 2017-06-02 ENCOUNTER — Encounter: Payer: Self-pay | Admitting: General Surgery

## 2017-06-02 VITALS — BP 150/79 | HR 78 | Temp 97.3°F | Resp 18 | Ht 64.0 in | Wt 137.0 lb

## 2017-06-02 DIAGNOSIS — R1011 Right upper quadrant pain: Secondary | ICD-10-CM

## 2017-06-02 NOTE — Progress Notes (Signed)
[name]; [id number]; Dec 16, 1960   HPI   Patient is a 56 year old white female who was referred to my care by Dr. Legrand Rams for evaluation and treatment of right upper quadrant abdominal pain.  Patient states has been present for 6 months.  She states her pain is 10 out of 10.  It is now becoming daily nature.  She says she is nauseated, but never has emesis.  She no specific fatty food intolerance is noted.  No fever, chills, jaundice noted. Past Medical History:  Diagnosis Date  . Anxiety   . BV (bacterial vaginosis) 05/23/2015  . Chronic abdominal pain   . COPD (chronic obstructive pulmonary disease) ([place])   . Diabetes mellitus    type I  . GERD (gastroesophageal reflux disease)   . Hiatal hernia   . Hyperlipidemia   . Hypertension   . Hypothyroid   . Polyneuropathy in diabetes ([place])   . PUD (peptic ulcer disease)    ? remote past, no reports from [place] or [facility], states possibly Dr. Laural Golden.   . Stroke ([place])   . Trichimoniasis   . Unspecified polyarthropathy or polyarthritis, site unspecified   . Vaginal discharge 05/23/2015    Past Surgical History:  Procedure Laterality Date  . ABDOMINAL HYSTERECTOMY    . APPENDECTOMY    . BLADDER REPAIR    . COLONOSCOPY N/A 06/01/2015   [id number]:[id number] hemorrhoid diminutive rectosigmoid otherwise normal  . ESOPHAGOGASTRODUODENOSCOPY     in remote past, unclear who performed. No op notes from [facility] or [facility] (pt thought Dr. Laural Golden)  . ESOPHAGOGASTRODUODENOSCOPY  02/12/2012   Dr. Gala Romney:  Normal esophagus status post 82 F dilation. Small hiatal hernia. mild chronic gastritis  . ESOPHAGOGASTRODUODENOSCOPY N/A 02/21/2014   UXN:ATFTDD exudative reflux esophagitis-and + Candida esophagitis.  Marland Kitchen ESOPHAGOGASTRODUODENOSCOPY N/A 06/01/2015   UKG:URKYH HH otherwise normal  . TONSILLECTOMY      Family History  Problem Relation Age of Onset  . Liver cancer Mother        remission for 2 years  . Cancer Mother        lung  . Dementia Father   . Heart  attack Father   . Other Sister        liver trouble  . Diabetes Brother   . Hyperthyroidism Brother   . Dementia Maternal Grandmother   . Diabetes Paternal Grandmother   . Diabetes Paternal Grandfather   . Colon cancer Neg Hx     Current Outpatient Prescriptions on File Prior to Visit  Medication Sig Dispense Refill  . albuterol (PROVENTIL HFA;VENTOLIN HFA) 108 (90 Base) MCG/ACT inhaler Inhale 2 puffs into the lungs every 6 (six) hours as needed for wheezing or shortness of breath. 1 Inhaler 0  . fluticasone (FLONASE) 50 MCG/ACT nasal spray Place 2 sprays into both nostrils daily as needed for allergies or rhinitis.    Marland Kitchen gabapentin (NEURONTIN) 100 MG capsule Take 200 mg by mouth 4 (four) times daily.     . Insulin Glargine (LANTUS SOLOSTAR) 100 UNIT/ML Solostar Pen Inject 50 Units into the skin at bedtime. (Patient taking differently: Inject 40-50 Units into the skin at bedtime. ) 15 mL 11  . insulin lispro (HUMALOG) 100 UNIT/ML injection Inject 5-10 Units into the skin 3 (three) times daily as needed for high blood sugar (Uses 3 timesdaily according to sliding scale).    Marland Kitchen levothyroxine (SYNTHROID, LEVOTHROID) 150 MCG tablet Take 150 mcg by mouth daily before breakfast.    . omeprazole (PRILOSEC) 40 MG capsule  Take 40 mg by mouth daily.    . ondansetron (ZOFRAN) 4 MG tablet TAKE ONE TABLET BY MOUTH EVERY FOUR HOURS AS NEEDED FOR NAUSEA OR VOMITING. 30 tablet 0  . simvastatin (ZOCOR) 20 MG tablet Take 20 mg by mouth daily.    . traMADol (ULTRAM) 50 MG tablet Take 50 mg by mouth 2 (two) times daily as needed for moderate pain.     . traZODone (DESYREL) 100 MG tablet Take 200 mg by mouth 2 (two) times daily.     No current facility-administered medications on file prior to visit.     Allergies  Allergen Reactions  . Phenytoin Anaphylaxis    Reaction: SJS (STEVENS JOHNSON SYNDROME)  . Sulfonamide Derivatives Nausea And Vomiting  . Tetracyclines & Related Nausea And Vomiting     History  Alcohol Use No    History  Smoking Status  . Current Every Day Smoker  . Packs/day: 0.00  . Years: 34.00  . Types: Cigarettes  Smokeless Tobacco  . Never Used    Comment: smokes 2 cig daily    Review of Systems  HENT: Positive for sinus pain.   Eyes: Negative.   Respiratory: Negative.   Cardiovascular: Negative.   Gastrointestinal: Positive for abdominal pain, heartburn and nausea.  Genitourinary: Negative.   Musculoskeletal: Positive for back pain, joint pain and neck pain.  Skin: Negative.   Neurological: Positive for headaches.  Endo/Heme/Allergies: Negative.   Psychiatric/Behavioral: Negative.     Objective   Vitals:   06/02/17 1426  BP: (!) 150/79  Pulse: 78  Resp: 18  Temp: (!) 97.3 F (36.3 C)    Physical Exam  Constitutional: She is oriented to person, place, and time and well-developed, well-nourished, and in no distress.  HENT:  Head: Normocephalic and atraumatic.  Eyes: No scleral icterus.  Cardiovascular: Normal rate, regular rhythm and normal heart sounds.  Exam reveals no gallop and no friction rub.   No murmur heard. Pulmonary/Chest: Effort normal and breath sounds normal. No respiratory distress. She has no wheezes. She has no rales.  Abdominal: Soft. Bowel sounds are normal. She exhibits no distension. There is tenderness. There is no rebound and no guarding.  Patient has discomfort to palpation in the right upper quadrant.  No rigidity is noted.  Neurological: She is alert and oriented to person, place, and time.  Skin: Skin is warm and dry.  Vitals reviewed.    Ultrasound report reviewed.  Dr. Junita Push notes reviewed.  No cholelithiasis seen on ultrasound Assessment   right upper quadrant abdominal pain Plan    will get a HIDA scan to further assess for chronic cholecystitis.  Further management pending those results.

## 2017-06-03 ENCOUNTER — Other Ambulatory Visit: Payer: Self-pay | Admitting: General Surgery

## 2017-06-03 DIAGNOSIS — R109 Unspecified abdominal pain: Secondary | ICD-10-CM

## 2017-06-11 ENCOUNTER — Encounter (HOSPITAL_COMMUNITY)
Admission: RE | Admit: 2017-06-11 | Discharge: 2017-06-11 | Disposition: A | Discharge status: Home / Self Care | Payer: Medicare Other | Source: Ambulatory Visit | Attending: General Surgery | Admitting: General Surgery

## 2017-06-11 ENCOUNTER — Encounter (HOSPITAL_COMMUNITY): Payer: Self-pay

## 2017-06-11 DIAGNOSIS — R109 Unspecified abdominal pain: Secondary | ICD-10-CM | POA: Diagnosis not present

## 2017-06-11 MED ORDER — TECHNETIUM TC 99M MEBROFENIN IV KIT
5.0000 | PACK | Freq: Once | INTRAVENOUS | Status: AC | PRN
  Administered 2017-06-11: 5.3 via INTRAVENOUS

## 2017-06-15 NOTE — H&P (Signed)
[name]; [id number]; 12-Aug-1961   HPI   Patient is a 56 year old white female who was referred to my care by Dr. Legrand Rams for evaluation and treatment of right upper quadrant abdominal pain.  Patient states has been present for 6 months.  She states her pain is 10 out of 10.  It is now becoming daily nature.  She says she is nauseated, but never has emesis.  She no specific fatty food intolerance is noted.  No fever, chills, jaundice noted. Past Medical History:  Diagnosis Date  . Anxiety   . BV (bacterial vaginosis) 05/23/2015  . Chronic abdominal pain   . COPD (chronic obstructive pulmonary disease) ([place])   . Diabetes mellitus    type I  . GERD (gastroesophageal reflux disease)   . Hiatal hernia   . Hyperlipidemia   . Hypertension   . Hypothyroid   . Polyneuropathy in diabetes ([place])   . PUD (peptic ulcer disease)    ? remote past, no reports from [place] [facility] or [facility], states possibly Dr. Laural Golden.   . Stroke ([place])   . Trichimoniasis   . Unspecified polyarthropathy or polyarthritis, site unspecified   . Vaginal discharge 05/23/2015    Past Surgical History:  Procedure Laterality Date  . ABDOMINAL HYSTERECTOMY    . APPENDECTOMY    . BLADDER REPAIR    . COLONOSCOPY N/A 06/01/2015   LZJ:[id number] hemorrhoid diminutive rectosigmoid otherwise normal  . ESOPHAGOGASTRODUODENOSCOPY     in remote past, unclear who performed. No op notes from [facility] or [facility] (pt thought Dr. Laural Golden)  . ESOPHAGOGASTRODUODENOSCOPY  02/12/2012   Dr. Gala Romney:  Normal esophagus status post 59 F dilation. Small hiatal hernia. mild chronic gastritis  . ESOPHAGOGASTRODUODENOSCOPY N/A 02/21/2014   TKW:IOXBDZ exudative reflux esophagitis-and + Candida esophagitis.  Marland Kitchen ESOPHAGOGASTRODUODENOSCOPY N/A 06/01/2015   HGD:JMEQA HH otherwise normal  . TONSILLECTOMY      Family History  Problem Relation Age of Onset  . Liver cancer Mother        remission for 2 years  . Cancer Mother        lung  . Dementia Father   . Heart  attack Father   . Other Sister        liver trouble  . Diabetes Brother   . Hyperthyroidism Brother   . Dementia Maternal Grandmother   . Diabetes Paternal Grandmother   . Diabetes Paternal Grandfather   . Colon cancer Neg Hx     Current Outpatient Prescriptions on File Prior to Visit  Medication Sig Dispense Refill  . albuterol (PROVENTIL HFA;VENTOLIN HFA) 108 (90 Base) MCG/ACT inhaler Inhale 2 puffs into the lungs every 6 (six) hours as needed for wheezing or shortness of breath. 1 Inhaler 0  . fluticasone (FLONASE) 50 MCG/ACT nasal spray Place 2 sprays into both nostrils daily as needed for allergies or rhinitis.    Marland Kitchen gabapentin (NEURONTIN) 100 MG capsule Take 200 mg by mouth 4 (four) times daily.     . Insulin Glargine (LANTUS SOLOSTAR) 100 UNIT/ML Solostar Pen Inject 50 Units into the skin at bedtime. (Patient taking differently: Inject 40-50 Units into the skin at bedtime. ) 15 mL 11  . insulin lispro (HUMALOG) 100 UNIT/ML injection Inject 5-10 Units into the skin 3 (three) times daily as needed for high blood sugar (Uses 3 timesdaily according to sliding scale).    Marland Kitchen levothyroxine (SYNTHROID, LEVOTHROID) 150 MCG tablet Take 150 mcg by mouth daily before breakfast.    . omeprazole (PRILOSEC) 40 MG capsule  Take 40 mg by mouth daily.    . ondansetron (ZOFRAN) 4 MG tablet TAKE ONE TABLET BY MOUTH EVERY FOUR HOURS AS NEEDED FOR NAUSEA OR VOMITING. 30 tablet 0  . simvastatin (ZOCOR) 20 MG tablet Take 20 mg by mouth daily.    . traMADol (ULTRAM) 50 MG tablet Take 50 mg by mouth 2 (two) times daily as needed for moderate pain.     . traZODone (DESYREL) 100 MG tablet Take 200 mg by mouth 2 (two) times daily.     No current facility-administered medications on file prior to visit.     Allergies  Allergen Reactions  . Phenytoin Anaphylaxis    Reaction: SJS (STEVENS JOHNSON SYNDROME)  . Sulfonamide Derivatives Nausea And Vomiting  . Tetracyclines & Related Nausea And Vomiting     History  Alcohol Use No    History  Smoking Status  . Current Every Day Smoker  . Packs/day: 0.00  . Years: 34.00  . Types: Cigarettes  Smokeless Tobacco  . Never Used    Comment: smokes 2 cig daily    Review of Systems  HENT: Positive for sinus pain.   Eyes: Negative.   Respiratory: Negative.   Cardiovascular: Negative.   Gastrointestinal: Positive for abdominal pain, heartburn and nausea.  Genitourinary: Negative.   Musculoskeletal: Positive for back pain, joint pain and neck pain.  Skin: Negative.   Neurological: Positive for headaches.  Endo/Heme/Allergies: Negative.   Psychiatric/Behavioral: Negative.     Objective   Vitals:   06/02/17 1426  BP: (!) 150/79  Pulse: 78  Resp: 18  Temp: (!) 97.3 F (36.3 C)    Physical Exam  Constitutional: She is oriented to person, place, and time and well-developed, well-nourished, and in no distress.  HENT:  Head: Normocephalic and atraumatic.  Eyes: No scleral icterus.  Cardiovascular: Normal rate, regular rhythm and normal heart sounds.  Exam reveals no gallop and no friction rub.   No murmur heard. Pulmonary/Chest: Effort normal and breath sounds normal. No respiratory distress. She has no wheezes. She has no rales.  Abdominal: Soft. Bowel sounds are normal. She exhibits no distension. There is tenderness. There is no rebound and no guarding.  Patient has discomfort to palpation in the right upper quadrant.  No rigidity is noted.  Neurological: She is alert and oriented to person, place, and time.  Skin: Skin is warm and dry.  Vitals reviewed.    Ultrasound report reviewed.  Dr. Junita Push notes reviewed.  No cholelithiasis seen on ultrasound Assessment   right upper quadrant abdominal pain Plan   Scheduled for laparoscopic cholecystectomy on 06/19/17.  Risks and benefits of procedure including bleeding, infection, hepatobiliary injury, recurrence of symptoms, and the possibility of an open procedure were fully  explained to the patient, who gives informed consent.

## 2017-06-16 NOTE — Patient Instructions (Signed)
[name]  06/16/2017     @PREFPERIOPPHARMACY @   Your procedure is scheduled on  06/19/2017   Report to [facility] at  0900  A.M.  Call this number if you have problems the morning of surgery:  [phone]   Remember:  Do not eat food or drink liquids after midnight.  Take these medicines the morning of surgery with A SIP OF WATER  Neurontin, levothyroxine, prilosec, zofran, ultram, trazodone. Use your inhaler before you come. Take 1/2 of your insulin the night before your surgery. DO NOT take any medications for diabetes the morning of your surgery.   Do not wear jewelry, make-up or nail polish.  Do not wear lotions, powders, or perfumes, or deoderant.  Do not shave 48 hours prior to surgery.  Men may shave face and neck.  Do not bring valuables to the hospital.  [facility] is not responsible for any belongings or valuables.  Contacts, dentures or bridgework may not be worn into surgery.  Leave your suitcase in the car.  After surgery it may be brought to your room.  For patients admitted to the hospital, discharge time will be determined by your treatment team.  Patients discharged the day of surgery will not be allowed to drive home.   Name and phone number of your driver:   family Special instructions:  None  Please read over the following fact sheets that you were given. Anesthesia Post-op Instructions and Care and Recovery After Surgery       Laparoscopic Cholecystectomy Laparoscopic cholecystectomy is surgery to remove the gallbladder. The gallbladder is a pear-shaped organ that lies beneath the liver on the right side of the body. The gallbladder stores bile, which is a fluid that helps the body to digest fats. Cholecystectomy is often done for inflammation of the gallbladder (cholecystitis). This condition is usually caused by a buildup of gallstones (cholelithiasis) in the gallbladder. Gallstones can block the flow of bile, which can result  in inflammation and pain. In severe cases, emergency surgery may be required. This procedure is done though small incisions in your abdomen (laparoscopic surgery). A thin scope with a camera (laparoscope) is inserted through one incision. Thin surgical instruments are inserted through the other incisions. In some cases, a laparoscopic procedure may be turned into a type of surgery that is done through a larger incision (open surgery). Tell a health care provider about:  Any allergies you have.  All medicines you are taking, including vitamins, herbs, eye drops, creams, and over-the-counter medicines.  Any problems you or family members have had with anesthetic medicines.  Any blood disorders you have.  Any surgeries you have had.  Any medical conditions you have.  Whether you are pregnant or may be pregnant. What are the risks? Generally, this is a safe procedure. However, problems may occur, including:  Infection.  Bleeding.  Allergic reactions to medicines.  Damage to other structures or organs.  A stone remaining in the common bile duct. The common bile duct carries bile from the gallbladder into the small intestine.  A bile leak from the cyst duct that is clipped when your gallbladder is removed.  What happens before the procedure? Staying hydrated Follow instructions from your health care provider about hydration, which may include:  Up to 2 hours before the procedure - you may continue to drink clear liquids, such as water, clear fruit juice, black coffee, and plain tea.  Eating and drinking restrictions Follow instructions from your health care provider about eating and drinking, which may include:  8 hours before the procedure - stop eating heavy meals or foods such as meat, fried foods, or fatty foods.  6 hours before the procedure - stop eating light meals or foods, such as toast or cereal.  6 hours before the procedure - stop drinking milk or drinks that  contain milk.  2 hours before the procedure - stop drinking clear liquids.  Medicines  Ask your health care provider about: ? Changing or stopping your regular medicines. This is especially important if you are taking diabetes medicines or blood thinners. ? Taking medicines such as aspirin and ibuprofen. These medicines can thin your blood. Do not take these medicines before your procedure if your health care provider instructs you not to.  You may be given antibiotic medicine to help prevent infection. General instructions  Let your health care provider know if you develop a cold or an infection before surgery.  Plan to have someone take you home from the hospital or clinic.  Ask your health care provider how your surgical site will be marked or identified. What happens during the procedure?  To reduce your risk of infection: ? Your health care team will wash or sanitize their hands. ? Your skin will be washed with soap. ? Hair may be removed from the surgical area.  An IV tube may be inserted into one of your veins.  You will be given one or more of the following: ? A medicine to help you relax (sedative). ? A medicine to make you fall asleep (general anesthetic).  A breathing tube will be placed in your mouth.  Your surgeon will make several small cuts (incisions) in your abdomen.  The laparoscope will be inserted through one of the small incisions. The camera on the laparoscope will send images to a TV screen (monitor) in the operating room. This lets your surgeon see inside your abdomen.  Air-like gas will be pumped into your abdomen. This will expand your abdomen to give the surgeon more room to perform the surgery.  Other tools that are needed for the procedure will be inserted through the other incisions. The gallbladder will be removed through one of the incisions.  Your common bile duct may be examined. If stones are found in the common bile duct, they may be  removed.  After your gallbladder has been removed, the incisions will be closed with stitches (sutures), staples, or skin glue.  Your incisions may be covered with a bandage (dressing). The procedure may vary among health care providers and hospitals. What happens after the procedure?  Your blood pressure, heart rate, breathing rate, and blood oxygen level will be monitored until the medicines you were given have worn off.  You will be given medicines as needed to control your pain.  Do not drive for 24 hours if you were given a sedative. This information is not intended to replace advice given to you by your health care provider. Make sure you discuss any questions you have with your health care provider. Document Released: 09/08/2005 Document Revised: 03/30/2016 Document Reviewed: 02/25/2016 Elsevier Interactive Patient Education  2018 [employer].  Laparoscopic Cholecystectomy, Care After This sheet gives you information about how to care for yourself after your procedure. Your health care provider may also give you more specific instructions. If you have problems or questions, contact your health care provider. What can I expect after the  procedure? After the procedure, it is common to have:  Pain at your incision sites. You will be given medicines to control this pain.  Mild nausea or vomiting.  Bloating and possible shoulder pain from the air-like gas that was used during the procedure.  Follow these instructions at home: Incision care   Follow instructions from your health care provider about how to take care of your incisions. Make sure you: ? Wash your hands with soap and water before you change your bandage (dressing). If soap and water are not available, use hand sanitizer. ? Change your dressing as told by your health care provider. ? Leave stitches (sutures), skin glue, or adhesive strips in place. These skin closures may need to be in place for 2 weeks or longer. If  adhesive strip edges start to loosen and curl up, you may trim the loose edges. Do not remove adhesive strips completely unless your health care provider tells you to do that.  Do not take baths, swim, or use a hot tub until your health care provider approves. Ask your health care provider if you can take showers. You may only be allowed to take sponge baths for bathing.  Check your incision area every day for signs of infection. Check for: ? More redness, swelling, or pain. ? More fluid or blood. ? Warmth. ? Pus or a bad smell. Activity  Do not drive or use heavy machinery while taking prescription pain medicine.  Do not lift anything that is heavier than 10 lb (4.5 kg) until your health care provider approves.  Do not play contact sports until your health care provider approves.  Do not drive for 24 hours if you were given a medicine to help you relax (sedative).  Rest as needed. Do not return to work or school until your health care provider approves. General instructions  Take over-the-counter and prescription medicines only as told by your health care provider.  To prevent or treat constipation while you are taking prescription pain medicine, your health care provider may recommend that you: ? Drink enough fluid to keep your urine clear or pale yellow. ? Take over-the-counter or prescription medicines. ? Eat foods that are high in fiber, such as fresh fruits and vegetables, whole grains, and beans. ? Limit foods that are high in fat and processed sugars, such as fried and sweet foods. Contact a health care provider if:  You develop a rash.  You have more redness, swelling, or pain around your incisions.  You have more fluid or blood coming from your incisions.  Your incisions feel warm to the touch.  You have pus or a bad smell coming from your incisions.  You have a fever.  One or more of your incisions breaks open. Get help right away if:  You have trouble  breathing.  You have chest pain.  You have increasing pain in your shoulders.  You faint or feel dizzy when you stand.  You have severe pain in your abdomen.  You have nausea or vomiting that lasts for more than one day.  You have leg pain. This information is not intended to replace advice given to you by your health care provider. Make sure you discuss any questions you have with your health care provider. Document Released: 09/08/2005 Document Revised: 03/29/2016 Document Reviewed: 02/25/2016 Elsevier Interactive Patient Education  2017 [place] Anesthesia, Adult General anesthesia is the use of medicines to make a person "go to sleep" (be unconscious) for a  medical procedure. General anesthesia is often recommended when a procedure:  Is long.  Requires you to be still or in an unusual position.  Is major and can cause you to lose blood.  Is impossible to do without general anesthesia.  The medicines used for general anesthesia are called general anesthetics. In addition to making you sleep, the medicines:  Prevent pain.  Control your blood pressure.  Relax your muscles.  Tell a health care provider about:  Any allergies you have.  All medicines you are taking, including vitamins, herbs, eye drops, creams, and over-the-counter medicines.  Any problems you or family members have had with anesthetic medicines.  Types of anesthetics you have had in the past.  Any bleeding disorders you have.  Any surgeries you have had.  Any medical conditions you have.  Any history of heart or lung conditions, such as heart failure, sleep apnea, or chronic obstructive pulmonary disease (COPD).  Whether you are pregnant or may be pregnant.  Whether you use tobacco, alcohol, marijuana, or street drugs.  Any history of Armed forces logistics/support/administrative officer.  Any history of depression or anxiety. What are the risks? Generally, this is a safe procedure. However, problems may occur,  including:  Allergic reaction to anesthetics.  Lung and heart problems.  Inhaling food or liquids from your stomach into your lungs (aspiration).  Injury to nerves.  Waking up during your procedure and being unable to move (rare).  Extreme agitation or a state of mental confusion (delirium) when you wake up from the anesthetic.  Air in the bloodstream, which can lead to stroke.  These problems are more likely to develop if you are having a major surgery or if you have an advanced medical condition. You can prevent some of these complications by answering all of your health care provider's questions thoroughly and by following all pre-procedure instructions. General anesthesia can cause side effects, including:  Nausea or vomiting  A sore throat from the breathing tube.  Feeling cold or shivery.  Feeling tired, washed out, or achy.  Sleepiness or drowsiness.  Confusion or agitation.  What happens before the procedure? Staying hydrated Follow instructions from your health care provider about hydration, which may include:  Up to 2 hours before the procedure - you may continue to drink clear liquids, such as water, clear fruit juice, black coffee, and plain tea.  Eating and drinking restrictions Follow instructions from your health care provider about eating and drinking, which may include:  8 hours before the procedure - stop eating heavy meals or foods such as meat, fried foods, or fatty foods.  6 hours before the procedure - stop eating light meals or foods, such as toast or cereal.  6 hours before the procedure - stop drinking milk or drinks that contain milk.  2 hours before the procedure - stop drinking clear liquids.  Medicines  Ask your health care provider about: ? Changing or stopping your regular medicines. This is especially important if you are taking diabetes medicines or blood thinners. ? Taking medicines such as aspirin and ibuprofen. These medicines  can thin your blood. Do not take these medicines before your procedure if your health care provider instructs you not to. ? Taking new dietary supplements or medicines. Do not take these during the week before your procedure unless your health care provider approves them.  If you are told to take a medicine or to continue taking a medicine on the day of the procedure, take the medicine with sips  of water. General instructions   Ask if you will be going home the same day, the following day, or after a longer hospital stay. ? Plan to have someone take you home. ? Plan to have someone stay with you for the first 24 hours after you leave the hospital or clinic.  For 3-6 weeks before the procedure, try not to use any tobacco products, such as cigarettes, chewing tobacco, and e-cigarettes.  You may brush your teeth on the morning of the procedure, but make sure to spit out the toothpaste. What happens during the procedure?  You will be given anesthetics through a mask and through an IV tube in one of your veins.  You may receive medicine to help you relax (sedative).  As soon as you are asleep, a breathing tube may be used to help you breathe.  An anesthesia specialist will stay with you throughout the procedure. He or she will help keep you comfortable and safe by continuing to give you medicines and adjusting the amount of medicine that you get. He or she will also watch your blood pressure, pulse, and oxygen levels to make sure that the anesthetics do not cause any problems.  If a breathing tube was used to help you breathe, it will be removed before you wake up. The procedure may vary among health care providers and hospitals. What happens after the procedure?  You will wake up, often slowly, after the procedure is complete, usually in a recovery area.  Your blood pressure, heart rate, breathing rate, and blood oxygen level will be monitored until the medicines you were given have worn  off.  You may be given medicine to help you calm down if you feel anxious or agitated.  If you will be going home the same day, your health care provider may check to make sure you can stand, drink, and urinate.  Your health care providers will treat your pain and side effects before you go home.  Do not drive for 24 hours if you received a sedative.  You may: ? Feel nauseous and vomit. ? Have a sore throat. ? Have mental slowness. ? Feel cold or shivery. ? Feel sleepy. ? Feel tired. ? Feel sore or achy, even in parts of your body where you did not have surgery. This information is not intended to replace advice given to you by your health care provider. Make sure you discuss any questions you have with your health care provider. Document Released: 12/16/2007 Document Revised: 02/19/2016 Document Reviewed: 08/23/2015 Elsevier Interactive Patient Education  2018 [place] Anesthesia, Adult, Care After These instructions provide you with information about caring for yourself after your procedure. Your health care provider may also give you more specific instructions. Your treatment has been planned according to current medical practices, but problems sometimes occur. Call your health care provider if you have any problems or questions after your procedure. What can I expect after the procedure? After the procedure, it is common to have:  Vomiting.  A sore throat.  Mental slowness.  It is common to feel:  Nauseous.  Cold or shivery.  Sleepy.  Tired.  Sore or achy, even in parts of your body where you did not have surgery.  Follow these instructions at home: For at least 24 hours after the procedure:  Do not: ? Participate in activities where you could fall or become injured. ? Drive. ? Use heavy machinery. ? Drink alcohol. ? Take sleeping pills or medicines that  cause drowsiness. ? Make important decisions or sign legal documents. ? Take care of  children on your own.  Rest. Eating and drinking  If you vomit, drink water, juice, or soup when you can drink without vomiting.  Drink enough fluid to keep your urine clear or pale yellow.  Make sure you have little or no nausea before eating solid foods.  Follow the diet recommended by your health care provider. General instructions  Have a responsible adult stay with you until you are awake and alert.  Return to your normal activities as told by your health care provider. Ask your health care provider what activities are safe for you.  Take over-the-counter and prescription medicines only as told by your health care provider.  If you smoke, do not smoke without supervision.  Keep all follow-up visits as told by your health care provider. This is important. Contact a health care provider if:  You continue to have nausea or vomiting at home, and medicines are not helpful.  You cannot drink fluids or start eating again.  You cannot urinate after 8-12 hours.  You develop a skin rash.  You have fever.  You have increasing redness at the site of your procedure. Get help right away if:  You have difficulty breathing.  You have chest pain.  You have unexpected bleeding.  You feel that you are having a life-threatening or urgent problem. This information is not intended to replace advice given to you by your health care provider. Make sure you discuss any questions you have with your health care provider. Document Released: 12/15/2000 Document Revised: 02/11/2016 Document Reviewed: 08/23/2015 Elsevier Interactive Patient Education  [employer].

## 2017-06-17 ENCOUNTER — Encounter (HOSPITAL_COMMUNITY)
Admission: RE | Admit: 2017-06-17 | Discharge: 2017-06-17 | Disposition: A | Discharge status: Home / Self Care | Payer: Medicare Other | Source: Ambulatory Visit | Attending: General Surgery | Admitting: General Surgery

## 2017-06-17 ENCOUNTER — Encounter (HOSPITAL_COMMUNITY): Payer: Self-pay

## 2017-06-17 DIAGNOSIS — E785 Hyperlipidemia, unspecified: Secondary | ICD-10-CM | POA: Diagnosis not present

## 2017-06-17 DIAGNOSIS — E1042 Type 1 diabetes mellitus with diabetic polyneuropathy: Secondary | ICD-10-CM | POA: Diagnosis not present

## 2017-06-17 DIAGNOSIS — J449 Chronic obstructive pulmonary disease, unspecified: Secondary | ICD-10-CM | POA: Diagnosis not present

## 2017-06-17 DIAGNOSIS — Z794 Long term (current) use of insulin: Secondary | ICD-10-CM | POA: Diagnosis not present

## 2017-06-17 DIAGNOSIS — K811 Chronic cholecystitis: Secondary | ICD-10-CM | POA: Diagnosis not present

## 2017-06-17 DIAGNOSIS — I10 Essential (primary) hypertension: Secondary | ICD-10-CM | POA: Diagnosis not present

## 2017-06-17 DIAGNOSIS — Z90710 Acquired absence of both cervix and uterus: Secondary | ICD-10-CM | POA: Diagnosis not present

## 2017-06-17 DIAGNOSIS — F419 Anxiety disorder, unspecified: Secondary | ICD-10-CM | POA: Diagnosis not present

## 2017-06-17 DIAGNOSIS — Z881 Allergy status to other antibiotic agents status: Secondary | ICD-10-CM | POA: Diagnosis not present

## 2017-06-17 DIAGNOSIS — F17210 Nicotine dependence, cigarettes, uncomplicated: Secondary | ICD-10-CM | POA: Diagnosis not present

## 2017-06-17 DIAGNOSIS — Z79899 Other long term (current) drug therapy: Secondary | ICD-10-CM | POA: Diagnosis not present

## 2017-06-17 DIAGNOSIS — Z8249 Family history of ischemic heart disease and other diseases of the circulatory system: Secondary | ICD-10-CM | POA: Diagnosis not present

## 2017-06-17 DIAGNOSIS — Z8711 Personal history of peptic ulcer disease: Secondary | ICD-10-CM | POA: Diagnosis not present

## 2017-06-17 DIAGNOSIS — Z888 Allergy status to other drugs, medicaments and biological substances status: Secondary | ICD-10-CM | POA: Diagnosis not present

## 2017-06-17 DIAGNOSIS — E039 Hypothyroidism, unspecified: Secondary | ICD-10-CM | POA: Diagnosis not present

## 2017-06-17 DIAGNOSIS — Z882 Allergy status to sulfonamides status: Secondary | ICD-10-CM | POA: Diagnosis not present

## 2017-06-17 DIAGNOSIS — K219 Gastro-esophageal reflux disease without esophagitis: Secondary | ICD-10-CM | POA: Diagnosis not present

## 2017-06-17 DIAGNOSIS — Z8673 Personal history of transient ischemic attack (TIA), and cerebral infarction without residual deficits: Secondary | ICD-10-CM | POA: Diagnosis not present

## 2017-06-17 HISTORY — DX: Unspecified osteoarthritis, unspecified site: M19.90

## 2017-06-17 HISTORY — DX: Unspecified convulsions: R56.9

## 2017-06-17 LAB — COMPREHENSIVE METABOLIC PANEL
ALK PHOS: 96 U/L (ref 38–126)
ALT: 11 U/L — ABNORMAL LOW (ref 14–54)
ANION GAP: 7 (ref 5–15)
AST: 16 U/L (ref 15–41)
Albumin: 4.1 g/dL (ref 3.5–5.0)
BUN: 17 mg/dL (ref 6–20)
CALCIUM: 9.3 mg/dL (ref 8.9–10.3)
CO2: 29 mmol/L (ref 22–32)
Chloride: 100 mmol/L — ABNORMAL LOW (ref 101–111)
Creatinine, Ser: 0.86 mg/dL (ref 0.44–1.00)
GFR calc non Af Amer: >60 mL/min (ref >60)
Glucose, Bld: 206 mg/dL — ABNORMAL HIGH (ref 65–99)
Potassium: 4.4 mmol/L (ref 3.5–5.1)
SODIUM: 136 mmol/L (ref 135–145)
Total Bilirubin: 0.5 mg/dL (ref 0.3–1.2)
Total Protein: 7.5 g/dL (ref 6.5–8.1)

## 2017-06-17 LAB — CBC WITH DIFFERENTIAL/PLATELET
Basophils Absolute: 0.1 10*3/uL (ref 0.0–0.1)
Basophils Relative: 1 %
EOS ABS: 0.2 10*3/uL (ref 0.0–0.7)
EOS PCT: 3 %
HCT: 41.6 % (ref 36.0–46.0)
Hemoglobin: 13.8 g/dL (ref 12.0–15.0)
LYMPHS ABS: 2 10*3/uL (ref 0.7–4.0)
Lymphocytes Relative: 30 %
MCH: 30.9 pg (ref 26.0–34.0)
MCHC: 33.2 g/dL (ref 30.0–36.0)
MCV: 93.3 fL (ref 78.0–100.0)
MONO ABS: 0.7 10*3/uL (ref 0.1–1.0)
MONOS PCT: 10 %
Neutro Abs: 3.7 10*3/uL (ref 1.7–7.7)
Neutrophils Relative %: 56 %
PLATELETS: 257 10*3/uL (ref 150–400)
RBC: 4.46 MIL/uL (ref 3.87–5.11)
RDW: 13.7 % (ref 11.5–15.5)
WBC: 6.6 10*3/uL (ref 4.0–10.5)

## 2017-06-19 ENCOUNTER — Ambulatory Visit (HOSPITAL_COMMUNITY): Payer: Medicare Other | Admitting: Anesthesiology

## 2017-06-19 ENCOUNTER — Encounter (HOSPITAL_COMMUNITY)
Admission: RE | Disposition: A | Discharge status: Home / Self Care | Payer: Self-pay | Source: Ambulatory Visit | Attending: General Surgery

## 2017-06-19 ENCOUNTER — Encounter (HOSPITAL_COMMUNITY): Payer: Self-pay | Admitting: *Deleted

## 2017-06-19 ENCOUNTER — Ambulatory Visit (HOSPITAL_COMMUNITY)
Admission: RE | Admit: 2017-06-19 | Discharge: 2017-06-19 | Disposition: A | Discharge status: Home / Self Care | Payer: Medicare Other | Source: Ambulatory Visit | Attending: General Surgery | Admitting: General Surgery

## 2017-06-19 DIAGNOSIS — Z882 Allergy status to sulfonamides status: Secondary | ICD-10-CM | POA: Insufficient documentation

## 2017-06-19 DIAGNOSIS — J449 Chronic obstructive pulmonary disease, unspecified: Secondary | ICD-10-CM | POA: Insufficient documentation

## 2017-06-19 DIAGNOSIS — Z90710 Acquired absence of both cervix and uterus: Secondary | ICD-10-CM | POA: Diagnosis not present

## 2017-06-19 DIAGNOSIS — K219 Gastro-esophageal reflux disease without esophagitis: Secondary | ICD-10-CM | POA: Diagnosis not present

## 2017-06-19 DIAGNOSIS — Z8249 Family history of ischemic heart disease and other diseases of the circulatory system: Secondary | ICD-10-CM | POA: Insufficient documentation

## 2017-06-19 DIAGNOSIS — F17210 Nicotine dependence, cigarettes, uncomplicated: Secondary | ICD-10-CM | POA: Insufficient documentation

## 2017-06-19 DIAGNOSIS — E1042 Type 1 diabetes mellitus with diabetic polyneuropathy: Secondary | ICD-10-CM | POA: Insufficient documentation

## 2017-06-19 DIAGNOSIS — E785 Hyperlipidemia, unspecified: Secondary | ICD-10-CM | POA: Diagnosis not present

## 2017-06-19 DIAGNOSIS — Z881 Allergy status to other antibiotic agents status: Secondary | ICD-10-CM | POA: Insufficient documentation

## 2017-06-19 DIAGNOSIS — Z794 Long term (current) use of insulin: Secondary | ICD-10-CM | POA: Insufficient documentation

## 2017-06-19 DIAGNOSIS — Z79899 Other long term (current) drug therapy: Secondary | ICD-10-CM | POA: Insufficient documentation

## 2017-06-19 DIAGNOSIS — Z8711 Personal history of peptic ulcer disease: Secondary | ICD-10-CM | POA: Diagnosis not present

## 2017-06-19 DIAGNOSIS — K811 Chronic cholecystitis: Secondary | ICD-10-CM | POA: Diagnosis not present

## 2017-06-19 DIAGNOSIS — Z888 Allergy status to other drugs, medicaments and biological substances status: Secondary | ICD-10-CM | POA: Insufficient documentation

## 2017-06-19 DIAGNOSIS — Z8673 Personal history of transient ischemic attack (TIA), and cerebral infarction without residual deficits: Secondary | ICD-10-CM | POA: Diagnosis not present

## 2017-06-19 DIAGNOSIS — F419 Anxiety disorder, unspecified: Secondary | ICD-10-CM | POA: Diagnosis not present

## 2017-06-19 DIAGNOSIS — E039 Hypothyroidism, unspecified: Secondary | ICD-10-CM | POA: Diagnosis not present

## 2017-06-19 DIAGNOSIS — I10 Essential (primary) hypertension: Secondary | ICD-10-CM | POA: Diagnosis not present

## 2017-06-19 HISTORY — PX: CHOLECYSTECTOMY: SHX55

## 2017-06-19 LAB — GLUCOSE, CAPILLARY
GLUCOSE-CAPILLARY: 266 mg/dL — AB (ref 65–99)
Glucose-Capillary: 85 mg/dL (ref 65–99)

## 2017-06-19 SURGERY — LAPAROSCOPIC CHOLECYSTECTOMY
Anesthesia: General

## 2017-06-19 MED ORDER — PROMETHAZINE HCL 25 MG/ML IJ SOLN
INTRAMUSCULAR | Status: AC
  Filled 2017-06-19: qty 1

## 2017-06-19 MED ORDER — POVIDONE-IODINE 10 % OINT PACKET
TOPICAL_OINTMENT | CUTANEOUS | Status: DC | PRN
  Administered 2017-06-19: 1 via TOPICAL

## 2017-06-19 MED ORDER — DEXAMETHASONE SODIUM PHOSPHATE 4 MG/ML IJ SOLN
4.0000 mg | Freq: Once | INTRAMUSCULAR | Status: AC
  Administered 2017-06-19: 4 mg via INTRAVENOUS
  Filled 2017-06-19: qty 1

## 2017-06-19 MED ORDER — LABETALOL HCL 5 MG/ML IV SOLN
INTRAVENOUS | Status: DC | PRN
  Administered 2017-06-19: 10 mg via INTRAVENOUS

## 2017-06-19 MED ORDER — BUPIVACAINE HCL (PF) 0.5 % IJ SOLN
INTRAMUSCULAR | Status: AC
  Filled 2017-06-19: qty 30

## 2017-06-19 MED ORDER — ONDANSETRON HCL 4 MG/2ML IJ SOLN
INTRAMUSCULAR | Status: AC
  Filled 2017-06-19: qty 2

## 2017-06-19 MED ORDER — PROPOFOL 10 MG/ML IV BOLUS
INTRAVENOUS | Status: DC | PRN
Start: 2017-06-19 — End: 2017-06-19
  Administered 2017-06-19: 50 mg via INTRAVENOUS
  Administered 2017-06-19: 150 mg via INTRAVENOUS
  Administered 2017-06-19: 30 mg via INTRAVENOUS

## 2017-06-19 MED ORDER — CIPROFLOXACIN IN D5W 400 MG/200ML IV SOLN
400.0000 mg | INTRAVENOUS | Status: AC
  Administered 2017-06-19: 400 mg via INTRAVENOUS
  Filled 2017-06-19: qty 200

## 2017-06-19 MED ORDER — DEXTROSE 50 % IV SOLN
12.5000 g | Freq: Once | INTRAVENOUS | Status: AC
  Administered 2017-06-19: 12.5 g via INTRAVENOUS

## 2017-06-19 MED ORDER — ROCURONIUM BROMIDE 100 MG/10ML IV SOLN
INTRAVENOUS | Status: DC | PRN
  Administered 2017-06-19: 5 mg via INTRAVENOUS
  Administered 2017-06-19: 25 mg via INTRAVENOUS

## 2017-06-19 MED ORDER — FENTANYL CITRATE (PF) 100 MCG/2ML IJ SOLN
INTRAMUSCULAR | Status: DC | PRN
  Administered 2017-06-19 (×2): 100 ug via INTRAVENOUS
  Administered 2017-06-19: 50 ug via INTRAVENOUS

## 2017-06-19 MED ORDER — FENTANYL CITRATE (PF) 250 MCG/5ML IJ SOLN
INTRAMUSCULAR | Status: AC
  Filled 2017-06-19: qty 5

## 2017-06-19 MED ORDER — ONDANSETRON HCL 4 MG/2ML IJ SOLN
4.0000 mg | Freq: Once | INTRAMUSCULAR | Status: AC
  Administered 2017-06-19: 4 mg via INTRAVENOUS

## 2017-06-19 MED ORDER — CHLORHEXIDINE GLUCONATE CLOTH 2 % EX PADS: 6.0000 | MEDICATED_PAD | Freq: Once | CUTANEOUS | Status: DC

## 2017-06-19 MED ORDER — HEMOSTATIC AGENTS (NO CHARGE) OPTIME
TOPICAL | Status: DC | PRN
  Administered 2017-06-19: 1 via TOPICAL

## 2017-06-19 MED ORDER — SODIUM CHLORIDE 0.9 % IR SOLN
Status: DC | PRN
  Administered 2017-06-19: 1000 mL

## 2017-06-19 MED ORDER — HYDROCODONE-ACETAMINOPHEN 5-325 MG PO TABS: 1.0000 | ORAL_TABLET | ORAL | 0 refills | Status: DC | PRN

## 2017-06-19 MED ORDER — BUPIVACAINE HCL (PF) 0.5 % IJ SOLN
INTRAMUSCULAR | Status: DC | PRN
  Administered 2017-06-19: 20 mL

## 2017-06-19 MED ORDER — LIDOCAINE HCL (PF) 1 % IJ SOLN
INTRAMUSCULAR | Status: AC
  Filled 2017-06-19: qty 5

## 2017-06-19 MED ORDER — HYDRALAZINE HCL 20 MG/ML IJ SOLN
INTRAMUSCULAR | Status: AC
  Filled 2017-06-19: qty 1

## 2017-06-19 MED ORDER — MIDAZOLAM HCL 2 MG/2ML IJ SOLN
1.0000 mg | INTRAMUSCULAR | Status: AC
  Administered 2017-06-19: 2 mg via INTRAVENOUS
  Filled 2017-06-19: qty 2

## 2017-06-19 MED ORDER — SODIUM CHLORIDE 0.9% FLUSH
INTRAVENOUS | Status: AC
  Filled 2017-06-19: qty 10

## 2017-06-19 MED ORDER — ROCURONIUM BROMIDE 50 MG/5ML IV SOLN
INTRAVENOUS | Status: AC
  Filled 2017-06-19: qty 1

## 2017-06-19 MED ORDER — KETOROLAC TROMETHAMINE 30 MG/ML IJ SOLN
INTRAMUSCULAR | Status: AC
  Filled 2017-06-19: qty 1

## 2017-06-19 MED ORDER — DEXTROSE 50 % IV SOLN
INTRAVENOUS | Status: AC
  Filled 2017-06-19: qty 50

## 2017-06-19 MED ORDER — LABETALOL HCL 5 MG/ML IV SOLN
INTRAVENOUS | Status: AC
  Filled 2017-06-19: qty 4

## 2017-06-19 MED ORDER — IPRATROPIUM-ALBUTEROL 0.5-2.5 (3) MG/3ML IN SOLN
3.0000 mL | Freq: Once | RESPIRATORY_TRACT | Status: AC
  Administered 2017-06-19: 3 mL via RESPIRATORY_TRACT
  Filled 2017-06-19: qty 3

## 2017-06-19 MED ORDER — PROPOFOL 10 MG/ML IV BOLUS
INTRAVENOUS | Status: AC
  Filled 2017-06-19: qty 40

## 2017-06-19 MED ORDER — POVIDONE-IODINE 10 % EX OINT
TOPICAL_OINTMENT | CUTANEOUS | Status: AC
  Filled 2017-06-19: qty 1

## 2017-06-19 MED ORDER — PROMETHAZINE HCL 25 MG/ML IJ SOLN
6.2500 mg | Freq: Once | INTRAMUSCULAR | Status: AC
  Administered 2017-06-19: 6.25 mg via INTRAVENOUS

## 2017-06-19 MED ORDER — GLYCOPYRROLATE 0.2 MG/ML IJ SOLN
INTRAMUSCULAR | Status: AC
  Filled 2017-06-19: qty 3

## 2017-06-19 MED ORDER — GLYCOPYRROLATE 0.2 MG/ML IJ SOLN
INTRAMUSCULAR | Status: DC | PRN
  Administered 2017-06-19: 0.4 mg via INTRAVENOUS

## 2017-06-19 MED ORDER — GLYCOPYRROLATE 0.2 MG/ML IJ SOLN
0.2000 mg | Freq: Once | INTRAMUSCULAR | Status: AC
  Administered 2017-06-19: 0.2 mg via INTRAVENOUS
  Filled 2017-06-19: qty 1

## 2017-06-19 MED ORDER — TRAMADOL HCL 50 MG PO TABS: 50.0000 mg | ORAL_TABLET | Freq: Four times a day (QID) | ORAL | 0 refills | Status: DC | PRN

## 2017-06-19 MED ORDER — FENTANYL CITRATE (PF) 100 MCG/2ML IJ SOLN
25.0000 ug | INTRAMUSCULAR | Status: DC | PRN
  Administered 2017-06-19: 50 ug via INTRAVENOUS
  Filled 2017-06-19: qty 2

## 2017-06-19 MED ORDER — LIDOCAINE HCL (CARDIAC) 10 MG/ML IV SOLN
INTRAVENOUS | Status: DC | PRN
  Administered 2017-06-19: 50 mg via INTRAVENOUS

## 2017-06-19 MED ORDER — NEOSTIGMINE METHYLSULFATE 10 MG/10ML IV SOLN
INTRAVENOUS | Status: DC | PRN
  Administered 2017-06-19: 3 mg via INTRAVENOUS

## 2017-06-19 MED ORDER — KETOROLAC TROMETHAMINE 30 MG/ML IJ SOLN
30.0000 mg | Freq: Once | INTRAMUSCULAR | Status: AC
  Administered 2017-06-19: 30 mg via INTRAVENOUS

## 2017-06-19 MED ORDER — NEOSTIGMINE METHYLSULFATE 10 MG/10ML IV SOLN
INTRAVENOUS | Status: AC
  Filled 2017-06-19: qty 1

## 2017-06-19 MED ORDER — LACTATED RINGERS IV SOLN
INTRAVENOUS | Status: DC
  Administered 2017-06-19: 10:00:00 via INTRAVENOUS

## 2017-06-19 SURGICAL SUPPLY — 54 items
APPLIER CLIP ROT 10 11.4 M/L (STAPLE) ×3
APR CLP MED LRG 11.4X10 (STAPLE) ×1
BAG HAMPER (MISCELLANEOUS) ×3 IMPLANT
BAG RETRIEVAL 10 (BASKET) ×1
BAG RETRIEVAL 10MM (BASKET) ×1
CHLORAPREP W/TINT 26ML (MISCELLANEOUS) ×3 IMPLANT
CLIP APPLIE ROT 10 11.4 M/L (STAPLE) ×1 IMPLANT
CLOTH BEACON ORANGE TIMEOUT ST (SAFETY) ×3 IMPLANT
COVER LIGHT HANDLE STERIS (MISCELLANEOUS) ×6 IMPLANT
DECANTER SPIKE VIAL GLASS SM (MISCELLANEOUS) ×3 IMPLANT
ELECT REM PT RETURN 9FT ADLT (ELECTROSURGICAL) ×3
ELECTRODE REM PT RTRN 9FT ADLT (ELECTROSURGICAL) ×1 IMPLANT
FILTER SMOKE EVAC LAPAROSHD (FILTER) ×3 IMPLANT
FORMALIN 10 PREFIL 120ML (MISCELLANEOUS) ×3 IMPLANT
GLOVE BIO SURGEON STRL SZ 6.5 (GLOVE) ×1 IMPLANT
GLOVE BIO SURGEONS STRL SZ 6.5 (GLOVE) ×1
GLOVE BIOGEL PI IND STRL 6.5 (GLOVE) IMPLANT
GLOVE BIOGEL PI IND STRL 7.0 (GLOVE) ×1 IMPLANT
GLOVE BIOGEL PI IND STRL 7.5 (GLOVE) IMPLANT
GLOVE BIOGEL PI INDICATOR 6.5 (GLOVE) ×2
GLOVE BIOGEL PI INDICATOR 7.0 (GLOVE) ×2
GLOVE BIOGEL PI INDICATOR 7.5 (GLOVE) ×2
GLOVE SURG SS PI 7.5 STRL IVOR (GLOVE) ×3 IMPLANT
GOWN STRL REUS W/ TWL XL LVL3 (GOWN DISPOSABLE) ×1 IMPLANT
GOWN STRL REUS W/TWL LRG LVL3 (GOWN DISPOSABLE) ×6 IMPLANT
GOWN STRL REUS W/TWL XL LVL3 (GOWN DISPOSABLE) ×3
HEMOSTAT SNOW SURGICEL 2X4 (HEMOSTASIS) ×3 IMPLANT
INST SET LAPROSCOPIC AP (KITS) ×3 IMPLANT
IV NS IRRIG 3000ML ARTHROMATIC (IV SOLUTION) IMPLANT
KIT ROOM TURNOVER APOR (KITS) ×3 IMPLANT
MANIFOLD NEPTUNE II (INSTRUMENTS) ×3 IMPLANT
NDL INSUFFLATION 14GA 120MM (NEEDLE) ×1 IMPLANT
NEEDLE INSUFFLATION 14GA 120MM (NEEDLE) ×3 IMPLANT
NS IRRIG 1000ML POUR BTL (IV SOLUTION) ×3 IMPLANT
PACK LAP CHOLE LZT030E (CUSTOM PROCEDURE TRAY) ×3 IMPLANT
PAD ARMBOARD 7.5X6 YLW CONV (MISCELLANEOUS) ×3 IMPLANT
SET BASIN LINEN APH (SET/KITS/TRAYS/PACK) ×3 IMPLANT
SET TUBE IRRIG SUCTION NO TIP (IRRIGATION / IRRIGATOR) IMPLANT
SLEEVE ENDOPATH XCEL 5M (ENDOMECHANICALS) ×3 IMPLANT
SPONGE GAUZE 2X2 8PLY STER LF (GAUZE/BANDAGES/DRESSINGS) ×4
SPONGE GAUZE 2X2 8PLY STRL LF (GAUZE/BANDAGES/DRESSINGS) ×8 IMPLANT
STAPLER VISISTAT (STAPLE) ×3 IMPLANT
SUT VICRYL 0 UR6 27IN ABS (SUTURE) ×3 IMPLANT
SYS BAG RETRIEVAL 10MM (BASKET) ×1
SYSTEM BAG RETRIEVAL 10MM (BASKET) ×1 IMPLANT
TAPE HYPAFIX 6 X30' (GAUZE/BANDAGES/DRESSINGS) ×1
TAPE HYPAFIX 6X30 (GAUZE/BANDAGES/DRESSINGS) ×1 IMPLANT
TROCAR ENDO BLADELESS 11MM (ENDOMECHANICALS) ×3 IMPLANT
TROCAR XCEL NON-BLD 5MMX100MML (ENDOMECHANICALS) ×3 IMPLANT
TROCAR XCEL UNIV SLVE 11M 100M (ENDOMECHANICALS) ×3 IMPLANT
TUBE CONNECTING 12'X1/4 (SUCTIONS) ×1
TUBE CONNECTING 12X1/4 (SUCTIONS) ×2 IMPLANT
TUBING INSUFFLATION (TUBING) ×3 IMPLANT
WARMER LAPAROSCOPE (MISCELLANEOUS) ×3 IMPLANT

## 2017-06-19 NOTE — Progress Notes (Signed)
Patient was asleep for the duration of her PACU stay.  Only would she complain of nausea or pain if awakened.

## 2017-06-19 NOTE — Transfer of Care (Signed)
Immediate Anesthesia Transfer of Care Note  Patient: [name]  Procedure(s) Performed: Procedure(s): LAPAROSCOPIC CHOLECYSTECTOMY (N/A)  Patient Location: PACU  Anesthesia Type:General  Level of Consciousness: awake and patient cooperative  Airway & Oxygen Therapy: Patient Spontanous Breathing and Patient connected to nasal cannula oxygen  Post-op Assessment: Report given to RN and Post -op Vital signs reviewed and stable  Post vital signs: Reviewed and stable  Last Vitals:  Vitals:   06/19/17 0955 06/19/17 1020  BP: (!) 153/75 (!) 162/75  Pulse:    Resp: (!) 28 15  Temp:    SpO2: 94% 95%    Last Pain:  Vitals:   06/19/17 0951  TempSrc: Oral  PainSc: 10-Worst pain ever         Complications: No apparent anesthesia complications

## 2017-06-19 NOTE — Op Note (Signed)
Patient:  [name]  DOB:  1961/03/10  MRN:  [id number]   Preop Diagnosis:  Chronic cholecystitis  Postop Diagnosis:  Same  Procedure:  Laparoscopic cholecystectomy  Surgeon:  Aviva Signs, M.D.  Asst.: Curlene Labrum, M.D.  Anes:  Gen. endotracheal  Indications:  Patient is a 57 year old white female who presents with biliary colic secondary to chronic cholecystitis. The risks and benefits of the procedure including bleeding, infection, hepatobiliary injury, and the possibility of an open procedure were fully explained to the patient, who gave informed consent.  Procedure note:  The patient was placed in the supine position. After induction of general endotracheal anesthesia, the abdomen was prepped and draped using the usual sterile technique with DuraPrep. Surgical site confirmation was performed.  A supraumbilical incision was made down the fascia. A Veress needle was introduced into the abdominal cavity and confirmation of placement was done using the saline drop test. The abdomen was then insufflated to 16 mmHg pressure. An 11 mm trocar was introduced into the abdominal cavity under direct visualization without difficulty. The patient was placed in reverse Trendelenburg position and an additional 11 mm trocar was placed the epigastric region and 5 mm trochars were placed the right upper quadrant and right flank regions. Liver was inspected and noted to be within normal limits. The gallbladder was retracted in a dynamic fashion in order to provide a critical view of the triangle of Calot. The cystic duct was first identified. Its juncture to the infundibulum was fully identified. The cystic artery was also identified. Endoclips were placed proximally and distally on these structures, and they were divided. The gallbladder was freed away from the gallbladder fossa using Bovie electrocautery. The gallbladder was delivered through the epigastric trocar site using an Endo Catch bag.  The gallbladder fossa was inspected and no abnormal bleeding or bile leakage was noted. Surgicel was placed the gallbladder fossa. All fluid and air were then evacuated from the abdominal cavity prior to the removal of the trochars.  All wounds were irrigated with normal saline. All wounds were injected with 0.5% Sensorcaine. The incisions were closed using staples. Betadine ointment and dry sterile dressings were applied.  All tape and needle counts were correct at the end the procedure. The patient was extubated in the operating room and transferred to PACU in stable condition.  Complications:  None  EBL:  Minimal  Specimen:  Gallbladder

## 2017-06-19 NOTE — Anesthesia Procedure Notes (Signed)
Procedure Name: Intubation Date/Time: 06/19/2017 10:41 AM Performed by: Vista Deck Pre-anesthesia Checklist: Patient identified, Patient being monitored, Timeout performed, Emergency Drugs available and Suction available Patient Re-evaluated:Patient Re-evaluated prior to induction Oxygen Delivery Method: Circle System Utilized Preoxygenation: Pre-oxygenation with 100% oxygen Induction Type: IV induction, Rapid sequence and Cricoid Pressure applied Ventilation: Mask ventilation without difficulty Laryngoscope Size: Mac and 3 Grade View: Grade II Tube type: Oral Tube size: 7.0 mm Number of attempts: 1 Airway Equipment and Method: stylet and Oral airway Placement Confirmation: ETT inserted through vocal cords under direct vision,  positive ETCO2 and breath sounds checked- equal and bilateral Secured at: 21 cm Tube secured with: Tape Dental Injury: Teeth and Oropharynx as per pre-operative assessment

## 2017-06-19 NOTE — Anesthesia Preprocedure Evaluation (Signed)
Anesthesia Evaluation  Patient identified by MRN, date of birth, ID band Patient awake    Reviewed: Allergy & Precautions, NPO status , Patient's Chart, lab work & pertinent test results  Airway Mallampati: II  TM Distance: >3 FB     Dental  (+) Edentulous Upper   Pulmonary shortness of breath and with exertion, COPD (am cough),  COPD inhaler, Current Smoker,    breath sounds clear to auscultation       Cardiovascular hypertension, Pt. on medications + DOE   Rhythm:Regular Rate:Normal     Neuro/Psych Seizures -, Well Controlled,  PSYCHIATRIC DISORDERS Anxiety Depression  Neuromuscular disease CVA, No Residual Symptoms    GI/Hepatic hiatal hernia, PUD, GERD  Medicated,  Endo/Other  diabetes, Well Controlled, Type 2Hypothyroidism   Renal/GU      Musculoskeletal  (+) Arthritis , Rheumatoid disorders,    Abdominal   Peds  Hematology   Anesthesia Other Findings   Reproductive/Obstetrics                             Anesthesia Physical Anesthesia Plan  ASA: III  Anesthesia Plan: General   Post-op Pain Management:    Induction: Intravenous, Rapid sequence and Cricoid pressure planned  PONV Risk Score and Plan:   Airway Management Planned: Oral ETT  Additional Equipment:   Intra-op Plan:   Post-operative Plan: Extubation in OR  Informed Consent: I have reviewed the patients History and Physical, chart, labs and discussed the procedure including the risks, benefits and alternatives for the proposed anesthesia with the patient or authorized representative who has indicated his/her understanding and acceptance.     Plan Discussed with:   Anesthesia Plan Comments:         Anesthesia Quick Evaluation

## 2017-06-19 NOTE — Discharge Instructions (Signed)
Laparoscopic Cholecystectomy, Care After °This sheet gives you information about how to care for yourself after your procedure. Your health care provider may also give you more specific instructions. If you have problems or questions, contact your health care provider. °What can I expect after the procedure? °After the procedure, it is common to have: °· Pain at your incision sites. You will be given medicines to control this pain. °· Mild nausea or vomiting. °· Bloating and possible shoulder pain from the air-like gas that was used during the procedure. °Follow these instructions at home: °Incision care  ° °· Follow instructions from your health care provider about how to take care of your incisions. Make sure you: °¨ Wash your hands with soap and water before you change your bandage (dressing). If soap and water are not available, use hand sanitizer. °¨ Change your dressing as told by your health care provider. °¨ Leave stitches (sutures), skin glue, or adhesive strips in place. These skin closures may need to be in place for 2 weeks or longer. If adhesive strip edges start to loosen and curl up, you may trim the loose edges. Do not remove adhesive strips completely unless your health care provider tells you to do that. °· Do not take baths, swim, or use a hot tub until your health care provider approves. Ask your health care provider if you can take showers. You may only be allowed to take sponge baths for bathing. °· Check your incision area every day for signs of infection. Check for: °¨ More redness, swelling, or pain. °¨ More fluid or blood. °¨ Warmth. °¨ Pus or a bad smell. °Activity  °· Do not drive or use heavy machinery while taking prescription pain medicine. °· Do not lift anything that is heavier than 10 lb (4.5 kg) until your health care provider approves. °· Do not play contact sports until your health care provider approves. °· Do not drive for 24 hours if you were given a medicine to help you relax  (sedative). °· Rest as needed. Do not return to work or school until your health care provider approves. °General instructions  °· Take over-the-counter and prescription medicines only as told by your health care provider. °· To prevent or treat constipation while you are taking prescription pain medicine, your health care provider may recommend that you: °¨ Drink enough fluid to keep your urine clear or pale yellow. °¨ Take over-the-counter or prescription medicines. °¨ Eat foods that are high in fiber, such as fresh fruits and vegetables, whole grains, and beans. °¨ Limit foods that are high in fat and processed sugars, such as fried and sweet foods. °Contact a health care provider if: °· You develop a rash. °· You have more redness, swelling, or pain around your incisions. °· You have more fluid or blood coming from your incisions. °· Your incisions feel warm to the touch. °· You have pus or a bad smell coming from your incisions. °· You have a fever. °· One or more of your incisions breaks open. °Get help right away if: °· You have trouble breathing. °· You have chest pain. °· You have increasing pain in your shoulders. °· You faint or feel dizzy when you stand. °· You have severe pain in your abdomen. °· You have nausea or vomiting that lasts for more than one day. °· You have leg pain. °This information is not intended to replace advice given to you by your health care provider. Make sure you discuss any   questions you have with your health care provider. °Document Released: 09/08/2005 Document Revised: 03/29/2016 Document Reviewed: 02/25/2016 °Elsevier Interactive Patient Education © 2017 [employer] ° °

## 2017-06-19 NOTE — Interval H&P Note (Signed)
History and Physical Interval Note:  06/19/2017 10:20 AM  [name]  has presented today for surgery, with the diagnosis of chronic cholelithiasis  The various methods of treatment have been discussed with the patient and family. After consideration of risks, benefits and other options for treatment, the patient has consented to  Procedure(s): LAPAROSCOPIC CHOLECYSTECTOMY (N/A) as a surgical intervention .  The patient's history has been reviewed, patient examined, no change in status, stable for surgery.  I have reviewed the patient's chart and labs.  Questions were answered to the patient's satisfaction.     Aviva Signs

## 2017-06-19 NOTE — Anesthesia Postprocedure Evaluation (Signed)
Anesthesia Post Note  Patient: [name]  Procedure(s) Performed: Procedure(s) (LRB): LAPAROSCOPIC CHOLECYSTECTOMY (N/A)  Patient location during evaluation: PACU Anesthesia Type: General Level of consciousness: awake and alert Pain management: satisfactory to patient Vital Signs Assessment: post-procedure vital signs reviewed and stable Respiratory status: spontaneous breathing Cardiovascular status: stable Postop Assessment: no apparent nausea or vomiting Anesthetic complications: no     Last Vitals:  Vitals:   06/19/17 1145 06/19/17 1200  BP: (!) 141/87 (!) 157/84  Pulse: 62 60  Resp: (!) 21 (!) 7  Temp:    SpO2: 95% 98%    Last Pain:  Vitals:   06/19/17 1200  TempSrc:   PainSc: 10-Worst pain ever                 Alayasia Breeding

## 2017-06-29 ENCOUNTER — Other Ambulatory Visit (HOSPITAL_COMMUNITY): Payer: Self-pay | Admitting: Internal Medicine

## 2017-06-29 DIAGNOSIS — Z09 Encounter for follow-up examination after completed treatment for conditions other than malignant neoplasm: Secondary | ICD-10-CM

## 2017-06-29 DIAGNOSIS — R921 Mammographic calcification found on diagnostic imaging of breast: Secondary | ICD-10-CM

## 2017-06-29 DIAGNOSIS — N6320 Unspecified lump in the left breast, unspecified quadrant: Secondary | ICD-10-CM

## 2017-06-30 ENCOUNTER — Ambulatory Visit: Payer: Medicare Other | Admitting: General Surgery

## 2017-07-07 ENCOUNTER — Ambulatory Visit: Payer: Medicare Other | Admitting: General Surgery

## 2017-07-09 ENCOUNTER — Ambulatory Visit: Payer: Medicare Other | Admitting: General Surgery

## 2017-07-21 ENCOUNTER — Encounter (HOSPITAL_COMMUNITY): Payer: Self-pay

## 2017-08-18 DIAGNOSIS — J441 Chronic obstructive pulmonary disease with (acute) exacerbation: Secondary | ICD-10-CM | POA: Diagnosis not present

## 2017-08-18 DIAGNOSIS — E039 Hypothyroidism, unspecified: Secondary | ICD-10-CM | POA: Diagnosis not present

## 2017-08-18 DIAGNOSIS — F331 Major depressive disorder, recurrent, moderate: Secondary | ICD-10-CM | POA: Diagnosis not present

## 2017-08-18 DIAGNOSIS — E785 Hyperlipidemia, unspecified: Secondary | ICD-10-CM | POA: Diagnosis not present

## 2017-08-18 DIAGNOSIS — Z79899 Other long term (current) drug therapy: Secondary | ICD-10-CM | POA: Diagnosis not present

## 2017-08-18 DIAGNOSIS — K219 Gastro-esophageal reflux disease without esophagitis: Secondary | ICD-10-CM | POA: Diagnosis not present

## 2017-08-18 DIAGNOSIS — E1142 Type 2 diabetes mellitus with diabetic polyneuropathy: Secondary | ICD-10-CM | POA: Diagnosis not present

## 2017-08-18 DIAGNOSIS — J449 Chronic obstructive pulmonary disease, unspecified: Secondary | ICD-10-CM | POA: Diagnosis not present

## 2017-08-18 DIAGNOSIS — E1140 Type 2 diabetes mellitus with diabetic neuropathy, unspecified: Secondary | ICD-10-CM | POA: Diagnosis not present

## 2017-08-18 DIAGNOSIS — F419 Anxiety disorder, unspecified: Secondary | ICD-10-CM | POA: Diagnosis not present

## 2017-08-18 DIAGNOSIS — F17200 Nicotine dependence, unspecified, uncomplicated: Secondary | ICD-10-CM | POA: Diagnosis not present

## 2018-01-13 DIAGNOSIS — J449 Chronic obstructive pulmonary disease, unspecified: Secondary | ICD-10-CM | POA: Diagnosis not present

## 2018-01-13 DIAGNOSIS — F320 Major depressive disorder, single episode, mild: Secondary | ICD-10-CM | POA: Diagnosis not present

## 2018-01-13 DIAGNOSIS — E039 Hypothyroidism, unspecified: Secondary | ICD-10-CM | POA: Diagnosis not present

## 2018-01-13 DIAGNOSIS — E1142 Type 2 diabetes mellitus with diabetic polyneuropathy: Secondary | ICD-10-CM | POA: Diagnosis not present

## 2018-02-07 ENCOUNTER — Encounter (HOSPITAL_COMMUNITY): Payer: Self-pay | Admitting: *Deleted

## 2018-02-07 ENCOUNTER — Other Ambulatory Visit: Payer: Self-pay

## 2018-02-07 ENCOUNTER — Emergency Department (HOSPITAL_COMMUNITY): Payer: Medicare Other

## 2018-02-07 ENCOUNTER — Emergency Department (HOSPITAL_COMMUNITY)
Admission: EM | Admit: 2018-02-07 | Discharge: 2018-02-07 | Disposition: A | Discharge status: Home / Self Care | Payer: Medicare Other | Attending: Emergency Medicine | Admitting: Emergency Medicine

## 2018-02-07 DIAGNOSIS — F17210 Nicotine dependence, cigarettes, uncomplicated: Secondary | ICD-10-CM | POA: Insufficient documentation

## 2018-02-07 DIAGNOSIS — R112 Nausea with vomiting, unspecified: Secondary | ICD-10-CM | POA: Diagnosis present

## 2018-02-07 DIAGNOSIS — J449 Chronic obstructive pulmonary disease, unspecified: Secondary | ICD-10-CM | POA: Diagnosis not present

## 2018-02-07 DIAGNOSIS — R197 Diarrhea, unspecified: Secondary | ICD-10-CM | POA: Diagnosis not present

## 2018-02-07 DIAGNOSIS — E1142 Type 2 diabetes mellitus with diabetic polyneuropathy: Secondary | ICD-10-CM | POA: Diagnosis not present

## 2018-02-07 DIAGNOSIS — N390 Urinary tract infection, site not specified: Secondary | ICD-10-CM | POA: Diagnosis not present

## 2018-02-07 DIAGNOSIS — Z8673 Personal history of transient ischemic attack (TIA), and cerebral infarction without residual deficits: Secondary | ICD-10-CM | POA: Diagnosis not present

## 2018-02-07 DIAGNOSIS — Z79899 Other long term (current) drug therapy: Secondary | ICD-10-CM | POA: Insufficient documentation

## 2018-02-07 DIAGNOSIS — K529 Noninfective gastroenteritis and colitis, unspecified: Secondary | ICD-10-CM | POA: Insufficient documentation

## 2018-02-07 DIAGNOSIS — I10 Essential (primary) hypertension: Secondary | ICD-10-CM | POA: Insufficient documentation

## 2018-02-07 DIAGNOSIS — W57XXXA Bitten or stung by nonvenomous insect and other nonvenomous arthropods, initial encounter: Secondary | ICD-10-CM | POA: Insufficient documentation

## 2018-02-07 DIAGNOSIS — S30861A Insect bite (nonvenomous) of abdominal wall, initial encounter: Secondary | ICD-10-CM | POA: Diagnosis not present

## 2018-02-07 DIAGNOSIS — Z794 Long term (current) use of insulin: Secondary | ICD-10-CM | POA: Insufficient documentation

## 2018-02-07 DIAGNOSIS — E039 Hypothyroidism, unspecified: Secondary | ICD-10-CM | POA: Insufficient documentation

## 2018-02-07 DIAGNOSIS — E1165 Type 2 diabetes mellitus with hyperglycemia: Secondary | ICD-10-CM | POA: Diagnosis not present

## 2018-02-07 LAB — URINALYSIS, ROUTINE W REFLEX MICROSCOPIC
Bilirubin Urine: NEGATIVE
HGB URINE DIPSTICK: NEGATIVE
KETONES UR: NEGATIVE mg/dL
NITRITE: POSITIVE — AB
PH: 5 (ref 5.0–8.0)
PROTEIN: NEGATIVE mg/dL
Specific Gravity, Urine: 1.015 (ref 1.005–1.030)

## 2018-02-07 LAB — COMPREHENSIVE METABOLIC PANEL
ALT: 13 U/L — AB (ref 14–54)
AST: 14 U/L — AB (ref 15–41)
Albumin: 3.3 g/dL — ABNORMAL LOW (ref 3.5–5.0)
Alkaline Phosphatase: 78 U/L (ref 38–126)
Anion gap: 4 — ABNORMAL LOW (ref 5–15)
BILIRUBIN TOTAL: 0.5 mg/dL (ref 0.3–1.2)
BUN: 18 mg/dL (ref 6–20)
CO2: 27 mmol/L (ref 22–32)
CREATININE: 0.9 mg/dL (ref 0.44–1.00)
Calcium: 8.2 mg/dL — ABNORMAL LOW (ref 8.9–10.3)
Chloride: 107 mmol/L (ref 101–111)
Glucose, Bld: 276 mg/dL — ABNORMAL HIGH (ref 65–99)
Potassium: 4.4 mmol/L (ref 3.5–5.1)
Sodium: 138 mmol/L (ref 135–145)
TOTAL PROTEIN: 6.1 g/dL — AB (ref 6.5–8.1)

## 2018-02-07 LAB — CBC
HEMATOCRIT: 40.4 % (ref 36.0–46.0)
HEMOGLOBIN: 13.5 g/dL (ref 12.0–15.0)
MCH: 31.1 pg (ref 26.0–34.0)
MCHC: 33.4 g/dL (ref 30.0–36.0)
MCV: 93.1 fL (ref 78.0–100.0)
Platelets: 232 10*3/uL (ref 150–400)
RBC: 4.34 MIL/uL (ref 3.87–5.11)
RDW: 13.8 % (ref 11.5–15.5)
WBC: 11.2 10*3/uL — ABNORMAL HIGH (ref 4.0–10.5)

## 2018-02-07 LAB — LIPASE, BLOOD: LIPASE: 23 U/L (ref 11–51)

## 2018-02-07 MED ORDER — METOCLOPRAMIDE HCL 10 MG PO TABS: 10.0000 mg | ORAL_TABLET | Freq: Four times a day (QID) | ORAL | 0 refills | Status: DC | PRN

## 2018-02-07 MED ORDER — DOXYCYCLINE HYCLATE 100 MG PO CAPS: 100.0000 mg | ORAL_CAPSULE | Freq: Two times a day (BID) | ORAL | 0 refills | Status: DC

## 2018-02-07 MED ORDER — METOCLOPRAMIDE HCL 5 MG/ML IJ SOLN
5.0000 mg | Freq: Once | INTRAMUSCULAR | Status: AC
  Administered 2018-02-07: 5 mg via INTRAVENOUS
  Filled 2018-02-07: qty 2

## 2018-02-07 MED ORDER — METOCLOPRAMIDE HCL 5 MG/ML IJ SOLN: 10.0000 mg | Freq: Once | INTRAMUSCULAR | Status: DC

## 2018-02-07 MED ORDER — SODIUM CHLORIDE 0.9 % IV BOLUS
2000.0000 mL | Freq: Once | INTRAVENOUS | Status: AC
  Administered 2018-02-07: 2000 mL via INTRAVENOUS

## 2018-02-07 NOTE — ED Provider Notes (Addendum)
[facility] EMERGENCY DEPARTMENT Provider Note   CSN: [id number] Arrival date & time: 02/07/18  1806     History   Chief Complaint Chief Complaint  Patient presents with  . Emesis    HPI [name] is a 57 y.o. female.  HPI Complains of nausea vomiting and diarrhea onset 3 days ago. She reports being written by several ticks 3 weeks ago on her scalp, suprapubic area and back.she states the ticks were on her for approximately a dayand were engorged.she vomited one time today and had a prostatectomy 10 episodes of diarrhea. Other associated symptoms include mild diffuse headache and diffuse body aches She reports fever of 103 2 days ago. She is treated herself with Zofran without relief. She also complains diffuse crampy abdominal pain.she denies chest pain. She admits to mild cough which is chronic. Breathing is at baseline for her. Nothing makes symptoms better or worse. No other associated symptoms Past Medical History:  Diagnosis Date  . Anxiety   . Arthritis    rheumatoid   . BV (bacterial vaginosis) 05/23/2015  . Chronic abdominal pain   . COPD (chronic obstructive pulmonary disease) ([place])   . Diabetes mellitus    type I  . GERD (gastroesophageal reflux disease)   . Hiatal hernia   . Hyperlipidemia   . Hypertension   . Hypothyroid   . Polyneuropathy in diabetes ([place])   . PUD (peptic ulcer disease)    ? remote past, no reports from [place] or [facility], states possibly Dr. Laural Golden.   . Seizures ([place])    had in the past with hypoglycemia.  . Stroke ([place])    no deficits.  . Trichimoniasis   . Unspecified polyarthropathy or polyarthritis, site unspecified   . Vaginal discharge 05/23/2015    Patient Active Problem List   Diagnosis Date Noted  . Chronic cholecystitis   . Acute URI 11/08/2015  . Depression 11/08/2015  . Hypothyroidism 11/08/2015  . Hyperglycemia 11/08/2015  . History of colonic polyps   . Dysphagia, pharyngoesophageal phase   . Vaginal discharge  05/23/2015  . BV (bacterial vaginosis) 05/23/2015  . Abdominal pain, generalized 05/16/2015  . Constipation 05/16/2015  . Esophageal dysphagia 05/16/2015  . Fibula fracture 09/07/2014  . DKA (diabetic ketoacidoses) ([place]) 02/20/2014  . Nausea with vomiting 02/20/2014  . Bereavement due to life event 03/03/2012  . Cocaine dependence ([place]) 03/03/2012  . Epigastric pain 01/29/2012  . GERD (gastroesophageal reflux disease) 01/29/2012  . Hematemesis 01/29/2012  . Screening for colon cancer 01/29/2012  . MIXED HYPERLIPIDEMIA 03/26/2010  . SMOKER 03/26/2010  . DYSPNEA 03/26/2010  . CHEST PAIN UNSPECIFIED 03/26/2010    Past Surgical History:  Procedure Laterality Date  . ABDOMINAL HYSTERECTOMY    . APPENDECTOMY    . BLADDER REPAIR    . CHOLECYSTECTOMY N/A 06/19/2017   Procedure: LAPAROSCOPIC CHOLECYSTECTOMY;  Surgeon: Aviva Signs, MD;  Location: [facility];  Service: General;  Laterality: N/A;  . COLONOSCOPY N/A 06/01/2015   XNA:TFTDDUKG hemorrhoid diminutive rectosigmoid otherwise normal  . ESOPHAGOGASTRODUODENOSCOPY     in remote past, unclear who performed. No op notes from [facility] or [place] (pt thought Dr. Laural Golden)  . ESOPHAGOGASTRODUODENOSCOPY  02/12/2012   Dr. Gala Romney:  Normal esophagus status post 47 F dilation. Small hiatal hernia. mild chronic gastritis  . ESOPHAGOGASTRODUODENOSCOPY N/A 02/21/2014   URK:YHCWCB exudative reflux esophagitis-and + Candida esophagitis.  Marland Kitchen ESOPHAGOGASTRODUODENOSCOPY N/A 06/01/2015   JSE:GBTDV HH otherwise normal  . TONSILLECTOMY       OB History  Gravida  1   Para  1   Term  1   Preterm      AB      Living  1     SAB      TAB      Ectopic      Multiple      Live Births  1            Home Medications    Prior to Admission medications   Medication Sig Start Date End Date Taking? Authorizing Provider  albuterol (PROVENTIL HFA;VENTOLIN HFA) 108 (90 Base) MCG/ACT inhaler Inhale 2 puffs into the lungs every 6 (six) hours as needed  for wheezing or shortness of breath. 12/18/16   Rancour, Annie Main, MD  albuterol (PROVENTIL) (2.5 MG/3ML) 0.083% nebulizer solution Take 2.5 mg by nebulization every 6 (six) hours as needed for wheezing or shortness of breath.    [provider]  DULoxetine (CYMBALTA) 20 MG capsule Take 1 capsule by mouth daily. 05/07/17   [provider]  fluticasone (FLONASE) 50 MCG/ACT nasal spray Place 2 sprays into both nostrils daily as needed for allergies or rhinitis.    [provider]  HYDROcodone-acetaminophen (NORCO) 5-325 MG tablet Take 1 tablet by mouth every 4 (four) hours as needed for moderate pain. 06/19/17   Aviva Signs, MD  Insulin Glargine (LANTUS SOLOSTAR) 100 UNIT/ML Solostar Pen Inject 50 Units into the skin at bedtime. Patient taking differently: Inject 40-50 Units into the skin at bedtime.  11/13/15   Rosita Fire, MD  insulin lispro (HUMALOG) 100 UNIT/ML injection Inject 5-10 Units into the skin 3 (three) times daily as needed for high blood sugar (Uses 3 timesdaily according to sliding scale).    [provider]  levothyroxine (SYNTHROID, LEVOTHROID) 150 MCG tablet Take 150 mcg by mouth daily before breakfast.    [provider]  omeprazole (PRILOSEC) 40 MG capsule Take 40 mg by mouth daily.    [provider]  ondansetron (ZOFRAN) 4 MG tablet TAKE ONE TABLET BY MOUTH EVERY FOUR HOURS AS NEEDED FOR NAUSEA OR VOMITING. 08/21/16   Annitta Needs, NP  simvastatin (ZOCOR) 20 MG tablet Take 20 mg by mouth daily.    [provider]  traZODone (DESYREL) 100 MG tablet Take 100 mg by mouth at bedtime.     [provider]    Family History Family History  Problem Relation Age of Onset  . Liver cancer Mother        remission for 2 years  . Cancer Mother        lung  . Dementia Father   . Heart attack Father   . Other Sister        liver trouble  . Diabetes Brother   . Hyperthyroidism Brother   . Dementia Maternal  Grandmother   . Diabetes Paternal Grandmother   . Diabetes Paternal Grandfather   . Colon cancer Neg Hx     Social History Social History   Tobacco Use  . Smoking status: Current Every Day Smoker    Packs/day: 0.25    Years: 34.00    Pack years: 8.50    Types: Cigarettes  . Smokeless tobacco: Never Used  . Tobacco comment: smokes 2 cig daily  Substance Use Topics  . Alcohol use: No    Alcohol/week: 0.0 oz  . Drug use: No     Allergies   Phenytoin; Sulfonamide derivatives; and Tetracyclines & related Reports the tetracycline causes nausea  Review  of Systems Review of Systems  Constitutional: Positive for fever.  Gastrointestinal: Positive for abdominal pain, diarrhea, nausea and vomiting.  Genitourinary: Positive for dysuria.       Admits to burning on urination for a few days  Musculoskeletal: Positive for myalgias.  Skin: Positive for wound.       Multiple tick bites  Allergic/Immunologic: Positive for immunocompromised state.       Diabetic  Neurological: Positive for headaches.  All other systems reviewed and are negative.    Physical Exam Updated Vital Signs BP 128/81 (BP Location: Right Arm)   Pulse 92   Temp 98.1 F (36.7 C) (Oral)   Resp 18   Ht 5\' 4"  (1.626 m)   Wt 59.9 kg (132 lb)   SpO2 96%   BMI 22.66 kg/m   Physical Exam  Constitutional: She is oriented to person, place, and time.  Chronically ill-appearing  HENT:  There are two 2 mm scabbed lesions, circular at left parietal scalp otherwise no spider atraumatic. No surrounding redness. No fluctuance  Eyes: Pupils are equal, round, and reactive to light. Conjunctivae are normal.  Neck: Neck supple. No tracheal deviation present. No thyromegaly present.  Cardiovascular: Normal rate and regular rhythm.  No murmur heard. Pulmonary/Chest: Effort normal and breath sounds normal.  Abdominal: Soft. Bowel sounds are normal. She exhibits no distension. There is tenderness. There is no guarding.    Minimal diffuse tenderness  Musculoskeletal: Normal range of motion. She exhibits no edema or tenderness.  Neurological: She is alert and oriented to person, place, and time. Coordination normal.  Gait normal  Skin: Skin is warm and dry. Capillary refill takes less than 2 seconds. No rash noted.  There is a 2 mmscabbed lesion at suprapubic area, without surrounding redness or tenderness. Skin is otherwise without rash  Psychiatric: She has a normal mood and affect.  Nursing note and vitals reviewed.    ED Treatments / Results  Labs (all labs ordered are listed, but only abnormal results are displayed) Labs Reviewed  CBC - Abnormal; Notable for the following components:      Result Value   WBC 11.2 (*)    All other components within normal limits  URINALYSIS, ROUTINE W REFLEX MICROSCOPIC  COMPREHENSIVE METABOLIC PANEL  LIPASE, BLOOD    EKG None  Radiology No results found.  Procedures Procedures (including critical care time) Results for orders placed or performed during the hospital encounter of 02/07/18  CBC  Result Value Ref Range   WBC 11.2 (H) 4.0 - 10.5 K/uL   RBC 4.34 3.87 - 5.11 MIL/uL   Hemoglobin 13.5 12.0 - 15.0 g/dL   HCT 40.4 36.0 - 46.0 %   MCV 93.1 78.0 - 100.0 fL   MCH 31.1 26.0 - 34.0 pg   MCHC 33.4 30.0 - 36.0 g/dL   RDW 13.8 11.5 - 15.5 %   Platelets 232 150 - 400 K/uL  Urinalysis, Routine w reflex microscopic  Result Value Ref Range   Color, Urine YELLOW YELLOW   APPearance HAZY (A) CLEAR   Specific Gravity, Urine 1.015 1.005 - 1.030   pH 5.0 5.0 - 8.0   Glucose, UA >=500 (A) NEGATIVE mg/dL   Hgb urine dipstick NEGATIVE NEGATIVE   Bilirubin Urine NEGATIVE NEGATIVE   Ketones, ur NEGATIVE NEGATIVE mg/dL   Protein, ur NEGATIVE NEGATIVE mg/dL   Nitrite POSITIVE (A) NEGATIVE   Leukocytes, UA MODERATE (A) NEGATIVE   RBC / HPF 0-5 0 - 5 RBC/hpf   WBC,  UA 21-50 0 - 5 WBC/hpf   Bacteria, UA MANY (A) NONE SEEN   Squamous Epithelial / LPF 0-5 0 -  5   WBC Clumps PRESENT    Mucus PRESENT    Hyaline Casts, UA PRESENT   Comprehensive metabolic panel  Result Value Ref Range   Sodium 138 135 - 145 mmol/L   Potassium 4.4 3.5 - 5.1 mmol/L   Chloride 107 101 - 111 mmol/L   CO2 27 22 - 32 mmol/L   Glucose, Bld 276 (H) 65 - 99 mg/dL   BUN 18 6 - 20 mg/dL   Creatinine, Ser 0.90 0.44 - 1.00 mg/dL   Calcium 8.2 (L) 8.9 - 10.3 mg/dL   Total Protein 6.1 (L) 6.5 - 8.1 g/dL   Albumin 3.3 (L) 3.5 - 5.0 g/dL   AST 14 (L) 15 - 41 U/L   ALT 13 (L) 14 - 54 U/L   Alkaline Phosphatase 78 38 - 126 U/L   Total Bilirubin 0.5 0.3 - 1.2 mg/dL   GFR calc non Af Amer >60 >60 mL/min   GFR calc Af Amer >60 >60 mL/min   Anion gap 4 (L) 5 - 15  Lipase, blood  Result Value Ref Range   Lipase 23 11 - 51 U/L   Dg Abd Acute W/chest  Result Date: 02/07/2018 CLINICAL DATA:  Vomiting, diarrhea, take by EXAM: DG ABDOMEN ACUTE W/ 1V CHEST COMPARISON:  Chest x-ray 05/28/2017 FINDINGS: Prior cholecystectomy. Moderate stool burden in the colon. Calcified phleboliths in the pelvis. No obstruction or free air. No organomegaly. Heart and mediastinal contours are within normal limits. No focal opacities or effusions. No acute bony abnormality. IMPRESSION: Moderate stool burden. No acute findings. No active cardiopulmonary disease. Electronically Signed   By: Rolm Baptise M.D.   On: 02/07/2018 20:04   Medications Ordered in ED Medications  sodium chloride 0.9 % bolus 2,000 mL (2,000 mLs Intravenous New Bag/Given 02/07/18 1917)  metoCLOPramide (REGLAN) injection 5 mg (5 mg Intravenous Given 02/07/18 1917)     Initial Impression / Assessment and Plan / ED Course  I have reviewed the triage vital signs and the nursing notes.  Pertinent labs & imaging results that were available during my care of the patient were reviewed by me and considered in my medical decision making (see chart for details).     9:05 PM feels much improved after treatment with intravenous  hydrationwith normal saline and intravenous Reglan. Feels ready to go home.area she is no longer nauseated. She is able to drink water without nausea or vomiting. plan prescription doxycycline, Reglan.avoid dairy. Encourage oral hydration. I doubt tick borne illness. Symptoms consistent with gastroenteritis but we'll treat with doxycycline. Urine sent for culture.  Lab work consistent with urinary tract infection and hyperglycemia.x-rays viewed by me  councilled pt for 5 minures on smoking cessation Final Clinical Impressions(s) / ED Diagnoses  Diagnosis #1gastroenteritis Final diagnoses:  None  #2 hyperglycemia #3 urinary tract infection #4 tick bites ED Discharge Orders    None      #5 tobacco abuse Orlie Dakin, MD 02/07/18 2114    Orlie Dakin, MD 02/07/18 2121    Orlie Dakin, MD 02/07/18 2123

## 2018-02-07 NOTE — Discharge Instructions (Addendum)
Make sure that you drink at least six 8 ounce glasses of water  each day in order to stay well-hydrated. Avoid milk or foods containing milk such as cheese or ice cream all having diarrhea. Take the medication prescribed for nausea. He can also take Zofran if needed. Take Imodium as directed for diarrhea. See your primary care physician if not feeling improved within 2 or 3 days. Return if you continue to vomit after taking the medication prescribedor if you feel worse for any reason. Ask your doctor to help you to stop smoking

## 2018-02-07 NOTE — ED Triage Notes (Signed)
Pt states that she was bitten by two separate ticks to the left side of the head with n/v/d that started 3 weeks ago,

## 2018-02-10 LAB — URINE CULTURE: Culture: >=100000 — AB

## 2018-02-11 ENCOUNTER — Telehealth: Payer: Self-pay | Admitting: *Deleted

## 2018-02-11 DIAGNOSIS — W57XXXD Bitten or stung by nonvenomous insect and other nonvenomous arthropods, subsequent encounter: Secondary | ICD-10-CM | POA: Diagnosis not present

## 2018-02-11 DIAGNOSIS — L089 Local infection of the skin and subcutaneous tissue, unspecified: Secondary | ICD-10-CM | POA: Diagnosis not present

## 2018-02-11 NOTE — Progress Notes (Signed)
ED Antimicrobial Stewardship Positive Culture Follow Up   [name] is an 57 y.o. female who presented to [facility] on 02/07/2018 with a chief complaint of  Chief Complaint  Patient presents with  . Emesis    Recent Results (from the past 720 hour(s))  Urine Culture     Status: Abnormal   Collection Time: 02/07/18  9:40 PM  Result Value Ref Range Status   Specimen Description   Final    URINE, CLEAN CATCH Performed at [facility], [address], [place], [place] [address]    Special Requests   Final    Immunocompromised Performed at [facility], [address] [place], [place] [address]    Culture >=100,000 COLONIES/mL ESCHERICHIA COLI (A)  Final   Report Status 02/10/2018 FINAL  Final   Organism ID, Bacteria ESCHERICHIA COLI (A)  Final      Susceptibility   Escherichia coli - MIC*    AMPICILLIN >=32 RESISTANT Resistant     CEFAZOLIN 8 SENSITIVE Sensitive     CEFTRIAXONE <=1 SENSITIVE Sensitive     CIPROFLOXACIN >=4 RESISTANT Resistant     GENTAMICIN <=1 SENSITIVE Sensitive     IMIPENEM <=0.25 SENSITIVE Sensitive     NITROFURANTOIN <=16 SENSITIVE Sensitive     TRIMETH/SULFA >=320 RESISTANT Resistant     AMPICILLIN/SULBACTAM >=32 RESISTANT Resistant     PIP/TAZO 64 INTERMEDIATE Intermediate     Extended ESBL NEGATIVE Sensitive     * >=100,000 COLONIES/mL ESCHERICHIA COLI     [x]  Patient discharged originally without antimicrobial agent and treatment is now indicated  New antibiotic prescription:  Call patient.  If symptoms resolving, finish doxycyline.  If still having burning with urination, add-on cephalexin 500 mg BID X 7 days and finish doxycycline.  ED Provider: Janetta Hora, PA-C   Susa Raring 02/11/2018, 11:29 AM Infectious Diseases Pharmacy Resident  Phone# [phone]

## 2018-02-11 NOTE — Telephone Encounter (Signed)
Post ED Visit - Positive Culture Follow-up: Unsuccessful Patient Follow-up  Culture assessed and recommendations reviewed by:  []  Elenor Quinones, Pharm.D. []  Heide Guile, Pharm.D., BCPS AQ-ID []  Parks Neptune, Pharm.D., BCPS []  Alycia Rossetti, Pharm.D., BCPS []  Brimhall Nizhoni, Pharm.D., BCPS, AAHIVP []  Legrand Como, Pharm.D., BCPS, AAHIVP []  Wynell Balloon, PharmD []  Vincenza Hews, PharmD, BCPS Jimmy Footman, PharmD  Positive urine culture  []  Patient discharged with antimicrobial prescription (Doxycycline) and additional treatment is now indicated if remains symptomatic, Cephalexin 500mg  PO BID x 7 days and finish Doxycycline []  Organism is resistant to prescribed ED discharge antimicrobial []  Patient with positive blood cultures   Unable to contact patient after 3 attempts, letter will be sent to address on file  Ardeen Fillers 02/11/2018, 12:16 PM

## 2018-02-18 ENCOUNTER — Telehealth: Payer: Self-pay | Admitting: *Deleted

## 2018-02-18 NOTE — Telephone Encounter (Signed)
Received call back from patient in response to letter sent to address on file.  States that UTI symptoms are resolved and no further treatment is necessary at this time.

## 2018-03-06 ENCOUNTER — Encounter (HOSPITAL_COMMUNITY): Payer: Self-pay

## 2018-03-06 ENCOUNTER — Other Ambulatory Visit: Payer: Self-pay

## 2018-03-06 ENCOUNTER — Inpatient Hospital Stay (HOSPITAL_COMMUNITY)
Admission: EM | Admit: 2018-03-06 | Discharge: 2018-03-08 | DRG: 638 | Disposition: A | Discharge status: Home / Self Care | Payer: Medicare Other | Attending: Internal Medicine | Admitting: Internal Medicine

## 2018-03-06 ENCOUNTER — Emergency Department (HOSPITAL_COMMUNITY): Payer: Medicare Other

## 2018-03-06 DIAGNOSIS — G8929 Other chronic pain: Secondary | ICD-10-CM | POA: Diagnosis present

## 2018-03-06 DIAGNOSIS — E119 Type 2 diabetes mellitus without complications: Secondary | ICD-10-CM | POA: Diagnosis not present

## 2018-03-06 DIAGNOSIS — E860 Dehydration: Secondary | ICD-10-CM

## 2018-03-06 DIAGNOSIS — K219 Gastro-esophageal reflux disease without esophagitis: Secondary | ICD-10-CM | POA: Diagnosis present

## 2018-03-06 DIAGNOSIS — E1142 Type 2 diabetes mellitus with diabetic polyneuropathy: Secondary | ICD-10-CM | POA: Diagnosis present

## 2018-03-06 DIAGNOSIS — J029 Acute pharyngitis, unspecified: Secondary | ICD-10-CM | POA: Diagnosis not present

## 2018-03-06 DIAGNOSIS — Z881 Allergy status to other antibiotic agents status: Secondary | ICD-10-CM

## 2018-03-06 DIAGNOSIS — A084 Viral intestinal infection, unspecified: Secondary | ICD-10-CM | POA: Diagnosis present

## 2018-03-06 DIAGNOSIS — E1310 Other specified diabetes mellitus with ketoacidosis without coma: Secondary | ICD-10-CM

## 2018-03-06 DIAGNOSIS — M130 Polyarthritis, unspecified: Secondary | ICD-10-CM | POA: Diagnosis present

## 2018-03-06 DIAGNOSIS — E1010 Type 1 diabetes mellitus with ketoacidosis without coma: Secondary | ICD-10-CM | POA: Diagnosis not present

## 2018-03-06 DIAGNOSIS — B379 Candidiasis, unspecified: Secondary | ICD-10-CM

## 2018-03-06 DIAGNOSIS — E1110 Type 2 diabetes mellitus with ketoacidosis without coma: Secondary | ICD-10-CM | POA: Diagnosis not present

## 2018-03-06 DIAGNOSIS — R110 Nausea: Secondary | ICD-10-CM | POA: Diagnosis not present

## 2018-03-06 DIAGNOSIS — R112 Nausea with vomiting, unspecified: Secondary | ICD-10-CM | POA: Diagnosis present

## 2018-03-06 DIAGNOSIS — E871 Hypo-osmolality and hyponatremia: Secondary | ICD-10-CM | POA: Diagnosis present

## 2018-03-06 DIAGNOSIS — Z888 Allergy status to other drugs, medicaments and biological substances status: Secondary | ICD-10-CM | POA: Diagnosis not present

## 2018-03-06 DIAGNOSIS — Z794 Long term (current) use of insulin: Secondary | ICD-10-CM

## 2018-03-06 DIAGNOSIS — E039 Hypothyroidism, unspecified: Secondary | ICD-10-CM | POA: Diagnosis present

## 2018-03-06 DIAGNOSIS — E11649 Type 2 diabetes mellitus with hypoglycemia without coma: Secondary | ICD-10-CM | POA: Diagnosis present

## 2018-03-06 DIAGNOSIS — Z882 Allergy status to sulfonamides status: Secondary | ICD-10-CM | POA: Diagnosis not present

## 2018-03-06 DIAGNOSIS — Z66 Do not resuscitate: Secondary | ICD-10-CM | POA: Diagnosis present

## 2018-03-06 DIAGNOSIS — Z8711 Personal history of peptic ulcer disease: Secondary | ICD-10-CM

## 2018-03-06 DIAGNOSIS — Z79899 Other long term (current) drug therapy: Secondary | ICD-10-CM

## 2018-03-06 DIAGNOSIS — Z8679 Personal history of other diseases of the circulatory system: Secondary | ICD-10-CM

## 2018-03-06 DIAGNOSIS — R55 Syncope and collapse: Secondary | ICD-10-CM | POA: Diagnosis not present

## 2018-03-06 DIAGNOSIS — R1111 Vomiting without nausea: Secondary | ICD-10-CM | POA: Diagnosis not present

## 2018-03-06 DIAGNOSIS — F320 Major depressive disorder, single episode, mild: Secondary | ICD-10-CM | POA: Diagnosis not present

## 2018-03-06 DIAGNOSIS — J310 Chronic rhinitis: Secondary | ICD-10-CM | POA: Diagnosis present

## 2018-03-06 DIAGNOSIS — R197 Diarrhea, unspecified: Secondary | ICD-10-CM | POA: Diagnosis not present

## 2018-03-06 DIAGNOSIS — E1165 Type 2 diabetes mellitus with hyperglycemia: Secondary | ICD-10-CM | POA: Diagnosis not present

## 2018-03-06 DIAGNOSIS — N179 Acute kidney failure, unspecified: Secondary | ICD-10-CM | POA: Diagnosis not present

## 2018-03-06 DIAGNOSIS — F17210 Nicotine dependence, cigarettes, uncomplicated: Secondary | ICD-10-CM | POA: Diagnosis present

## 2018-03-06 DIAGNOSIS — N289 Disorder of kidney and ureter, unspecified: Secondary | ICD-10-CM | POA: Diagnosis not present

## 2018-03-06 DIAGNOSIS — Z8673 Personal history of transient ischemic attack (TIA), and cerebral infarction without residual deficits: Secondary | ICD-10-CM

## 2018-03-06 DIAGNOSIS — J449 Chronic obstructive pulmonary disease, unspecified: Secondary | ICD-10-CM | POA: Diagnosis present

## 2018-03-06 DIAGNOSIS — R109 Unspecified abdominal pain: Secondary | ICD-10-CM | POA: Diagnosis present

## 2018-03-06 DIAGNOSIS — R0689 Other abnormalities of breathing: Secondary | ICD-10-CM | POA: Diagnosis not present

## 2018-03-06 DIAGNOSIS — E861 Hypovolemia: Secondary | ICD-10-CM | POA: Diagnosis present

## 2018-03-06 DIAGNOSIS — Z8659 Personal history of other mental and behavioral disorders: Secondary | ICD-10-CM

## 2018-03-06 DIAGNOSIS — Z9049 Acquired absence of other specified parts of digestive tract: Secondary | ICD-10-CM

## 2018-03-06 LAB — URINALYSIS, ROUTINE W REFLEX MICROSCOPIC
Bilirubin Urine: NEGATIVE
Ketones, ur: 80 mg/dL — AB
NITRITE: NEGATIVE
PH: 5 (ref 5.0–8.0)
Protein, ur: NEGATIVE mg/dL
RBC / HPF: >50 RBC/hpf — ABNORMAL HIGH (ref 0–5)
Specific Gravity, Urine: 1.02 (ref 1.005–1.030)
WBC, UA: >50 WBC/hpf — ABNORMAL HIGH (ref 0–5)

## 2018-03-06 LAB — COMPREHENSIVE METABOLIC PANEL
ALBUMIN: 4.5 g/dL (ref 3.5–5.0)
ALK PHOS: 109 U/L (ref 38–126)
ALT: 18 U/L (ref 14–54)
ANION GAP: 19 — AB (ref 5–15)
AST: 16 U/L (ref 15–41)
BILIRUBIN TOTAL: 0.9 mg/dL (ref 0.3–1.2)
BUN: 30 mg/dL — ABNORMAL HIGH (ref 6–20)
CALCIUM: 9.5 mg/dL (ref 8.9–10.3)
CO2: 16 mmol/L — ABNORMAL LOW (ref 22–32)
Chloride: 94 mmol/L — ABNORMAL LOW (ref 101–111)
Creatinine, Ser: 1.25 mg/dL — ABNORMAL HIGH (ref 0.44–1.00)
GFR calc Af Amer: 54 mL/min — ABNORMAL LOW (ref >60)
GFR calc non Af Amer: 46 mL/min — ABNORMAL LOW (ref >60)
GLUCOSE: 424 mg/dL — AB (ref 65–99)
Potassium: 4.1 mmol/L (ref 3.5–5.1)
Sodium: 129 mmol/L — ABNORMAL LOW (ref 135–145)
Total Protein: 8.6 g/dL — ABNORMAL HIGH (ref 6.5–8.1)

## 2018-03-06 LAB — GLUCOSE, CAPILLARY: GLUCOSE-CAPILLARY: 231 mg/dL — AB (ref 65–99)

## 2018-03-06 LAB — CBG MONITORING, ED
GLUCOSE-CAPILLARY: 268 mg/dL — AB (ref 65–99)
Glucose-Capillary: 427 mg/dL — ABNORMAL HIGH (ref 65–99)

## 2018-03-06 LAB — CBC WITH DIFFERENTIAL/PLATELET
BASOS PCT: 0 %
Basophils Absolute: 0 10*3/uL (ref 0.0–0.1)
Eosinophils Absolute: 0 10*3/uL (ref 0.0–0.7)
Eosinophils Relative: 0 %
HCT: 46 % (ref 36.0–46.0)
HEMOGLOBIN: 15.5 g/dL — AB (ref 12.0–15.0)
LYMPHS ABS: 2 10*3/uL (ref 0.7–4.0)
LYMPHS PCT: 19 %
MCH: 31.9 pg (ref 26.0–34.0)
MCHC: 33.7 g/dL (ref 30.0–36.0)
MCV: 94.7 fL (ref 78.0–100.0)
MONO ABS: 0.7 10*3/uL (ref 0.1–1.0)
MONOS PCT: 6 %
NEUTROS PCT: 75 %
Neutro Abs: 8.1 10*3/uL — ABNORMAL HIGH (ref 1.7–7.7)
Platelets: 255 10*3/uL (ref 150–400)
RBC: 4.86 MIL/uL (ref 3.87–5.11)
RDW: 13.7 % (ref 11.5–15.5)
WBC: 10.9 10*3/uL — ABNORMAL HIGH (ref 4.0–10.5)

## 2018-03-06 LAB — LIPASE, BLOOD: LIPASE: 18 U/L (ref 11–51)

## 2018-03-06 LAB — PROTIME-INR
INR: 1.06
PROTHROMBIN TIME: 13.7 s (ref 11.4–15.2)

## 2018-03-06 LAB — TROPONIN I: Troponin I: <0.03 ng/mL (ref <0.03)

## 2018-03-06 MED ORDER — POTASSIUM CHLORIDE 10 MEQ/100ML IV SOLN
INTRAVENOUS | Status: AC
  Filled 2018-03-06: qty 100

## 2018-03-06 MED ORDER — ENOXAPARIN SODIUM 40 MG/0.4ML ~~LOC~~ SOLN
40.0000 mg | SUBCUTANEOUS | Status: DC
  Administered 2018-03-06 – 2018-03-07 (×2): 40 mg via SUBCUTANEOUS
  Filled 2018-03-06 (×2): qty 0.4

## 2018-03-06 MED ORDER — SODIUM CHLORIDE 0.9 % IV SOLN
INTRAVENOUS | Status: DC
  Administered 2018-03-06: 22:00:00 via INTRAVENOUS

## 2018-03-06 MED ORDER — INSULIN REGULAR BOLUS VIA INFUSION: 0.0000 [IU] | Freq: Three times a day (TID) | INTRAVENOUS | Status: DC

## 2018-03-06 MED ORDER — PANTOPRAZOLE SODIUM 40 MG PO TBEC
40.0000 mg | DELAYED_RELEASE_TABLET | Freq: Every day | ORAL | Status: DC
  Administered 2018-03-07 – 2018-03-08 (×2): 40 mg via ORAL
  Filled 2018-03-06 (×2): qty 1

## 2018-03-06 MED ORDER — ACETAMINOPHEN 325 MG PO TABS
650.0000 mg | ORAL_TABLET | Freq: Once | ORAL | Status: AC
  Administered 2018-03-06: 650 mg via ORAL
  Filled 2018-03-06: qty 2

## 2018-03-06 MED ORDER — DEXTROSE-NACL 5-0.45 % IV SOLN
INTRAVENOUS | Status: DC
  Administered 2018-03-06: 23:00:00 via INTRAVENOUS

## 2018-03-06 MED ORDER — SODIUM CHLORIDE 0.9 % IV BOLUS
1000.0000 mL | Freq: Once | INTRAVENOUS | Status: AC
  Administered 2018-03-06: 1000 mL via INTRAVENOUS

## 2018-03-06 MED ORDER — GABAPENTIN 100 MG PO CAPS
200.0000 mg | ORAL_CAPSULE | Freq: Four times a day (QID) | ORAL | Status: DC
  Administered 2018-03-06 – 2018-03-08 (×7): 200 mg via ORAL
  Filled 2018-03-06 (×7): qty 2

## 2018-03-06 MED ORDER — SODIUM CHLORIDE 0.9 % IV SOLN
INTRAVENOUS | Status: DC
  Administered 2018-03-06: 2.1 [IU]/h via INTRAVENOUS
  Filled 2018-03-06: qty 1

## 2018-03-06 MED ORDER — FAMOTIDINE IN NACL 20-0.9 MG/50ML-% IV SOLN
20.0000 mg | Freq: Once | INTRAVENOUS | Status: AC
  Administered 2018-03-06: 20 mg via INTRAVENOUS
  Filled 2018-03-06: qty 50

## 2018-03-06 MED ORDER — ONDANSETRON HCL 4 MG/2ML IJ SOLN
4.0000 mg | INTRAMUSCULAR | Status: DC | PRN
  Administered 2018-03-06: 4 mg via INTRAVENOUS
  Filled 2018-03-06: qty 2

## 2018-03-06 MED ORDER — MORPHINE SULFATE (PF) 4 MG/ML IV SOLN: 3.0000 mg | INTRAVENOUS | Status: DC | PRN

## 2018-03-06 MED ORDER — POTASSIUM CHLORIDE 10 MEQ/100ML IV SOLN
10.0000 meq | INTRAVENOUS | Status: AC
  Administered 2018-03-06: 10 meq via INTRAVENOUS
  Filled 2018-03-06: qty 100

## 2018-03-06 MED ORDER — SODIUM CHLORIDE 0.9 % IV SOLN: INTRAVENOUS | Status: DC

## 2018-03-06 MED ORDER — INSULIN REGULAR HUMAN 100 UNIT/ML IJ SOLN
INTRAMUSCULAR | Status: AC
  Filled 2018-03-06: qty 1

## 2018-03-06 MED ORDER — TRAZODONE HCL 50 MG PO TABS
100.0000 mg | ORAL_TABLET | Freq: Every day | ORAL | Status: DC
  Administered 2018-03-06 – 2018-03-07 (×2): 100 mg via ORAL
  Filled 2018-03-06 (×2): qty 2

## 2018-03-06 MED ORDER — LEVOTHYROXINE SODIUM 75 MCG PO TABS
150.0000 ug | ORAL_TABLET | Freq: Every day | ORAL | Status: DC
  Administered 2018-03-07 – 2018-03-08 (×2): 150 ug via ORAL
  Filled 2018-03-06 (×2): qty 3
  Filled 2018-03-06 (×2): qty 2

## 2018-03-06 MED ORDER — ONDANSETRON HCL 4 MG/2ML IJ SOLN
4.0000 mg | Freq: Four times a day (QID) | INTRAMUSCULAR | Status: DC | PRN
Start: 2018-03-06 — End: 2018-03-08
  Administered 2018-03-06 – 2018-03-08 (×6): 4 mg via INTRAVENOUS
  Filled 2018-03-06 (×6): qty 2

## 2018-03-06 MED ORDER — FAMOTIDINE IN NACL 20-0.9 MG/50ML-% IV SOLN
20.0000 mg | Freq: Two times a day (BID) | INTRAVENOUS | Status: DC
  Administered 2018-03-07 – 2018-03-08 (×3): 20 mg via INTRAVENOUS
  Filled 2018-03-06 (×3): qty 50

## 2018-03-06 MED ORDER — ALBUTEROL SULFATE (2.5 MG/3ML) 0.083% IN NEBU
2.5000 mg | INHALATION_SOLUTION | Freq: Four times a day (QID) | RESPIRATORY_TRACT | Status: DC | PRN
Start: 2018-03-06 — End: 2018-03-08

## 2018-03-06 MED ORDER — HYDROCODONE-ACETAMINOPHEN 5-325 MG PO TABS
1.0000 | ORAL_TABLET | ORAL | Status: DC | PRN
  Administered 2018-03-06 – 2018-03-08 (×8): 1 via ORAL
  Filled 2018-03-06 (×9): qty 1

## 2018-03-06 MED ORDER — DEXTROSE 50 % IV SOLN: 25.0000 mL | INTRAVENOUS | Status: DC | PRN

## 2018-03-06 MED ORDER — DEXTROSE-NACL 5-0.45 % IV SOLN: INTRAVENOUS | Status: DC

## 2018-03-06 NOTE — ED Notes (Signed)
Pt ambulated to [facility] with assist, instructed pt to get urine sample

## 2018-03-06 NOTE — ED Triage Notes (Signed)
Pt reports high blood sugar, nausea, vomiting, diarrhea x 3 days. Pt reports syncope yesterday. Pt reports taking insulin as prescribed and has not eaten hardly anything in 3 days.

## 2018-03-06 NOTE — H&P (Signed)
History and Physical    [name]:[id number] DOB: 01/09/1961 DOA: 03/06/2018  PCP: Rosita Fire, MD   Patient coming from: Home  Chief Complaint: Abd pain, N/V/D, hyperglycemia   HPI: [name] is a 57 y.o. female with medical history significant for cocaine dependence, insulin-dependent diabetes mellitus, and hypothyroidism, now presenting to the emergency department for evaluation of diffuse abdominal pain, nausea, vomiting, and diarrhea.  Patient reports that she been in her usual state of health until approximately 3 days ago when she noted the insidious development of  generalized abdominal discomfort with nausea.  She reports that she is gone on to have multiple episodes of vomiting and diarrhea.  She also reports that her blood sugar has been running high despite her reported adherence with her insulin regimen.  She denies chest pain, headache, fevers, or chills.  She also denies any change in vision or hearing or focal numbness or weakness.  ED Course: Upon arrival to the ED, patient is found to be afebrile, saturating well on room air, and with vitals otherwise normal.  EKG features a sinus rhythm, chest x-ray is negative for acute cardiopulmonary disease, and KUB features a nonobstructive bowel gas pattern.  Chemistry panel is notable for sodium of 129, bicarbonate 16, anion gap 19, glucose of 424, and creatinine 1.25, up from 0.9 last month.  CBC features a mild leukocytosis to 10,000 and a hemoglobin of 15.5.  Troponin is undetectable.  Patient was given a liter of normal saline, Zofran, Pepcid, and started on insulin infusion.  She remains hemodynamically stable and will be admitted for ongoing evaluation and management of diabetic ketoacidosis.  Review of Systems:  All other systems reviewed and apart from HPI, are negative.  Past Medical History:  Diagnosis Date  . Anxiety   . Arthritis    rheumatoid   . BV (bacterial vaginosis) 05/23/2015  . Chronic  abdominal pain   . COPD (chronic obstructive pulmonary disease) ([place])   . Diabetes mellitus    type I  . GERD (gastroesophageal reflux disease)   . Hiatal hernia   . Hyperlipidemia   . Hypertension   . Hypothyroid   . Polyneuropathy in diabetes ([place])   . PUD (peptic ulcer disease)    ? remote past, no reports from [facility] or [facility], states possibly Dr. Laural Golden.   . Seizures ([place])    had in the past with hypoglycemia.  . Stroke ([place])    no deficits.  . Trichimoniasis   . Unspecified polyarthropathy or polyarthritis, site unspecified   . Vaginal discharge 05/23/2015    Past Surgical History:  Procedure Laterality Date  . ABDOMINAL HYSTERECTOMY    . APPENDECTOMY    . BLADDER REPAIR    . CHOLECYSTECTOMY N/A 06/19/2017   Procedure: LAPAROSCOPIC CHOLECYSTECTOMY;  Surgeon: Aviva Signs, MD;  Location: [facility];  Service: General;  Laterality: N/A;  . COLONOSCOPY N/A 06/01/2015   KGM:WNUUVOZD hemorrhoid diminutive rectosigmoid otherwise normal  . ESOPHAGOGASTRODUODENOSCOPY     in remote past, unclear who performed. No op notes from [facility] or [facility] (pt thought Dr. Laural Golden)  . ESOPHAGOGASTRODUODENOSCOPY  02/12/2012   Dr. Gala Romney:  Normal esophagus status post 35 F dilation. Small hiatal hernia. mild chronic gastritis  . ESOPHAGOGASTRODUODENOSCOPY N/A 02/21/2014   GUY:QIHKVQ exudative reflux esophagitis-and + Candida esophagitis.  Marland Kitchen ESOPHAGOGASTRODUODENOSCOPY N/A 06/01/2015   QVZ:DGLOV HH otherwise normal  . TONSILLECTOMY       reports that she has been smoking cigarettes.  She has a 8.50 pack-year  smoking history. She has never used smokeless tobacco. She reports that she does not drink alcohol or use drugs.  Allergies  Allergen Reactions  . Phenytoin Anaphylaxis    Reaction: SJS (STEVENS JOHNSON SYNDROME)  . Sulfonamide Derivatives Nausea And Vomiting  . Tetracyclines & Related Nausea And Vomiting    Family History  Problem Relation Age of Onset  . Liver cancer Mother        remission  for 2 years  . Cancer Mother        lung  . Dementia Father   . Heart attack Father   . Other Sister        liver trouble  . Diabetes Brother   . Hyperthyroidism Brother   . Dementia Maternal Grandmother   . Diabetes Paternal Grandmother   . Diabetes Paternal Grandfather   . Colon cancer Neg Hx      Prior to Admission medications   Medication Sig Start Date End Date Taking? Authorizing Provider  albuterol (PROVENTIL HFA;VENTOLIN HFA) 108 (90 Base) MCG/ACT inhaler Inhale 2 puffs into the lungs every 6 (six) hours as needed for wheezing or shortness of breath. 12/18/16  Yes Rancour, Annie Main, MD  albuterol (PROVENTIL) (2.5 MG/3ML) 0.083% nebulizer solution Take 2.5 mg by nebulization every 6 (six) hours as needed for wheezing or shortness of breath.   Yes [provider]  fluticasone (FLONASE) 50 MCG/ACT nasal spray Place 2 sprays into both nostrils daily as needed for allergies or rhinitis.   Yes [provider]  gabapentin (NEURONTIN) 100 MG capsule Take 200 mg by mouth 4 (four) times daily. 01/21/18  Yes [provider]  Insulin Glargine (LANTUS SOLOSTAR) 100 UNIT/ML Solostar Pen Inject 50 Units into the skin at bedtime. Patient taking differently: Inject 40-50 Units into the skin at bedtime.  11/13/15  Yes Rosita Fire, MD  insulin lispro (HUMALOG) 100 UNIT/ML injection Inject 5-10 Units into the skin 3 (three) times daily as needed for high blood sugar (Uses 3 timesdaily according to sliding scale).   Yes [provider]  levothyroxine (SYNTHROID, LEVOTHROID) 150 MCG tablet Take 150 mcg by mouth daily before breakfast.   Yes [provider]  omeprazole (PRILOSEC) 40 MG capsule Take 40 mg by mouth daily.   Yes [provider]  ondansetron (ZOFRAN) 4 MG tablet TAKE ONE TABLET BY MOUTH EVERY FOUR HOURS AS NEEDED FOR NAUSEA OR VOMITING. 08/21/16  Yes Annitta Needs, NP  traZODone (DESYREL) 100 MG tablet Take 100 mg by mouth at bedtime.     Yes [provider]    Physical Exam: Vitals:   03/06/18 1934 03/06/18 1939  BP:  (!) 145/69  Pulse:  92  Resp:  20  Temp:  97.8 F (36.6 C)  TempSrc:  Oral  SpO2:  98%  Weight: 59.9 kg (132 lb)   Height: 5\' 4"  (1.626 m)       Constitutional: NAD, appears uncomfortable  Eyes: PERTLA, lids and conjunctivae normal ENMT: Mucous membranes are dry. Posterior pharynx clear of any exudate or lesions.   Neck: normal, supple, no masses, no thyromegaly Respiratory: clear to auscultation bilaterally, no wheezing, no crackles. Normal respiratory effort.   Cardiovascular: S1 & S2 heard, regular rate and rhythm. No extremity edema.   Abdomen: No distension, soft, mild generalized tenderness without rebound pain or guarding. Bowel sounds active.  Musculoskeletal: no clubbing / cyanosis. No joint deformity upper and lower extremities.   Skin: no significant rashes, lesions, ulcers. Poor turgor. Neurologic: CN 2-12  grossly intact. Sensation intact. Strength 5/5 in all 4 limbs.  Psychiatric:  Alert and oriented x 3. Calm and cooperative.     Labs on Admission: I have personally reviewed following labs and imaging studies  CBC: Recent Labs  Lab 03/06/18 1952  WBC 10.9*  NEUTROABS 8.1*  HGB 15.5*  HCT 46.0  MCV 94.7  PLT 893   Basic Metabolic Panel: Recent Labs  Lab 03/06/18 1952  NA 129*  K 4.1  CL 94*  CO2 16*  GLUCOSE 424*  BUN 30*  CREATININE 1.25*  CALCIUM 9.5   GFR: Estimated Creatinine Clearance: 42.4 mL/min (A) (by C-G formula based on SCr of 1.25 mg/dL (H)). Liver Function Tests: Recent Labs  Lab 03/06/18 1952  AST 16  ALT 18  ALKPHOS 109  BILITOT 0.9  PROT 8.6*  ALBUMIN 4.5   Recent Labs  Lab 03/06/18 1952  LIPASE 18   No results for input(s): AMMONIA in the last 168 hours. Coagulation Profile: Recent Labs  Lab 03/06/18 1952  INR 1.06   Cardiac Enzymes: Recent Labs  Lab 03/06/18 1952  TROPONINI <0.03   BNP (last 3 results) No  results for input(s): PROBNP in the last 8760 hours. HbA1C: No results for input(s): HGBA1C in the last 72 hours. CBG: Recent Labs  Lab 03/06/18 1937  GLUCAP 427*   Lipid Profile: No results for input(s): CHOL, HDL, LDLCALC, TRIG, CHOLHDL, LDLDIRECT in the last 72 hours. Thyroid Function Tests: No results for input(s): TSH, T4TOTAL, FREET4, T3FREE, THYROIDAB in the last 72 hours. Anemia Panel: No results for input(s): VITAMINB12, FOLATE, FERRITIN, TIBC, IRON, RETICCTPCT in the last 72 hours. Urine analysis:    Component Value Date/Time   COLORURINE YELLOW 02/07/2018 1910   APPEARANCEUR HAZY (A) 02/07/2018 1910   LABSPEC 1.015 02/07/2018 1910   PHURINE 5.0 02/07/2018 1910   GLUCOSEU >=500 (A) 02/07/2018 1910   HGBUR NEGATIVE 02/07/2018 1910   BILIRUBINUR NEGATIVE 02/07/2018 1910   KETONESUR NEGATIVE 02/07/2018 1910   PROTEINUR NEGATIVE 02/07/2018 1910   UROBILINOGEN 0.2 04/17/2014 1954   NITRITE POSITIVE (A) 02/07/2018 1910   LEUKOCYTESUR MODERATE (A) 02/07/2018 1910   Sepsis Labs: @LABRCNTIP (procalcitonin:4,lacticidven:4) )No results found for this or any previous visit (from the past 240 hour(s)).   Radiological Exams on Admission: Dg Abd Acute W/chest  Result Date: 03/06/2018 CLINICAL DATA:  Nausea, vomiting and diarrhea. EXAM: DG ABDOMEN ACUTE W/ 1V CHEST COMPARISON:  Chest x-ray and abdominal x-rays dated 02/07/2018. FINDINGS: Single-view of the chest: Heart size and mediastinal contours are within normal limits. Lungs are clear. No pleural effusion or pneumothorax seen. Osseous structures about the chest are unremarkable. Upright and supine views of the abdomen: Bowel gas pattern is nonobstructive. No evidence of soft tissue mass or abnormal fluid collection. No evidence of free intraperitoneal air. No evidence of renal or ureteral calculi. Stable vascular phleboliths within the lower pelvis. Cholecystectomy clips in the RIGHT upper quadrant. No acute or suspicious osseous  finding. IMPRESSION: 1. No active cardiopulmonary disease. No evidence of pneumonia or pulmonary edema. 2. No acute findings within the abdomen or pelvis. Nonobstructive bowel gas pattern. Electronically Signed   By: Franki Cabot M.D.   On: 03/06/2018 20:25    EKG: Independently reviewed. Sinus rhythm.   Assessment/Plan   1. DKA; insulin-dependent DM  - Presents with abd pain, nausea, vomiting, and diarrhea; found to be in DKA  - A1c was 11.0% remotely  - Unclear precipitant, possibly drug abuse, gastroenteritis, or poor adherence with insulin-regimen  -  Treated in ED with 1 liter NS, Pepcid, Zofran, and insulin infusion  - Continue insulin-infusion and IVF hydration with frequent CBG's and serial chem panels   2. Abdominal pain with N/V/D - Exam is benign and KUB unremarkable  - Likely secondary to DKA, or possibly gastroenteritis leading to DKA though she is not vomiting or having diarrhea in ED  - Continue glycemic-control, fluid-resuscitation, monitor lytes, Pepcid, prn Zofran    3. Mild renal insufficiency  - SCr is 1.25 on admission, up from 0.95 last month  - Likely a prerenal azotemia in setting of DKA with N/V/D  - Continue IVF hydration, renally-dose medications, avoid nephrotoxins, and repeat chem panel   4. Hyponatremia  - Serum sodium is 129 on admission  - Secondary to hyperglycemia and hypovolemia  - Continue insulin and normal saline infusions, repeat chem panel   5. Hypothyroidism  - Continue Synthroid     DVT prophylaxis: Lovenox Code Status: Full  Family Communication: Discussed with patient Consults called: None Admission status: Inpatient    Vianne Bulls, MD [facility] Pager [phone]  If 7PM-7AM, please contact night-coverage [email or web address] Password [email or web address]  03/06/2018, 9:06 PM

## 2018-03-06 NOTE — ED Provider Notes (Signed)
[facility] EMERGENCY DEPARTMENT Provider Note   CSN: [id number] Arrival date & time: 03/06/18  1930     History   Chief Complaint Chief Complaint  Patient presents with  . Hyperglycemia    HPI [name] is a 57 y.o. female.  HPI  Pt was seen at 1910. Per pt, c/o gradual onset and persistence of multiple intermittent episodes of N/V/D for the past 3 days. Describes the stools as "black."  Has been associated with poor PO intake and elevated CBG's despite taking her insulin as prescribed. Pt states she "passed out" yesterday. Denies abd pain, no CP/SOB, no back pain, no fevers, no blood in stools, no black or blood in emesis, no focal motor weakness, no tingling/numbness in extremities.    Past Medical History:  Diagnosis Date  . Anxiety   . Arthritis    rheumatoid   . BV (bacterial vaginosis) 05/23/2015  . Chronic abdominal pain   . COPD (chronic obstructive pulmonary disease) ([place])   . Diabetes mellitus    type I  . GERD (gastroesophageal reflux disease)   . Hiatal hernia   . Hyperlipidemia   . Hypertension   . Hypothyroid   . Polyneuropathy in diabetes ([place])   . PUD (peptic ulcer disease)    ? remote past, no reports from [facility] or [facility], states possibly Dr. Laural Golden.   . Seizures ([place])    had in the past with hypoglycemia.  . Stroke ([place])    no deficits.  . Trichimoniasis   . Unspecified polyarthropathy or polyarthritis, site unspecified   . Vaginal discharge 05/23/2015    Patient Active Problem List   Diagnosis Date Noted  . Chronic cholecystitis   . Depression 11/08/2015  . Hypothyroidism 11/08/2015  . History of colonic polyps   . Constipation 05/16/2015  . DKA (diabetic ketoacidoses) ([place]) 02/20/2014  . Nausea with vomiting 02/20/2014  . Cocaine dependence ([place]) 03/03/2012  . GERD (gastroesophageal reflux disease) 01/29/2012  . MIXED HYPERLIPIDEMIA 03/26/2010  . SMOKER 03/26/2010    Past Surgical History:  Procedure Laterality Date  .  ABDOMINAL HYSTERECTOMY    . APPENDECTOMY    . BLADDER REPAIR    . CHOLECYSTECTOMY N/A 06/19/2017   Procedure: LAPAROSCOPIC CHOLECYSTECTOMY;  Surgeon: Aviva Signs, MD;  Location: [facility];  Service: General;  Laterality: N/A;  . COLONOSCOPY N/A 06/01/2015   WUJ:WJXBJYNW hemorrhoid diminutive rectosigmoid otherwise normal  . ESOPHAGOGASTRODUODENOSCOPY     in remote past, unclear who performed. No op notes from [facility] or [facility] (pt thought Dr. Laural Golden)  . ESOPHAGOGASTRODUODENOSCOPY  02/12/2012   Dr. Gala Romney:  Normal esophagus status post 52 F dilation. Small hiatal hernia. mild chronic gastritis  . ESOPHAGOGASTRODUODENOSCOPY N/A 02/21/2014   GNF:AOZHYQ exudative reflux esophagitis-and + Candida esophagitis.  Marland Kitchen ESOPHAGOGASTRODUODENOSCOPY N/A 06/01/2015   [facility]:QIONG HH otherwise normal  . TONSILLECTOMY       OB History    Gravida  1   Para  1   Term  1   Preterm      AB      Living  1     SAB      TAB      Ectopic      Multiple      Live Births  1            Home Medications    Prior to Admission medications   Medication Sig Start Date End Date Taking? Authorizing Provider  albuterol (PROVENTIL HFA;VENTOLIN HFA) 108 (90 Base) MCG/ACT inhaler  Inhale 2 puffs into the lungs every 6 (six) hours as needed for wheezing or shortness of breath. 12/18/16  Yes Rancour, Annie Main, MD  albuterol (PROVENTIL) (2.5 MG/3ML) 0.083% nebulizer solution Take 2.5 mg by nebulization every 6 (six) hours as needed for wheezing or shortness of breath.   Yes [provider]  fluticasone (FLONASE) 50 MCG/ACT nasal spray Place 2 sprays into both nostrils daily as needed for allergies or rhinitis.   Yes [provider]  gabapentin (NEURONTIN) 100 MG capsule Take 200 mg by mouth 4 (four) times daily. 01/21/18  Yes [provider]  Insulin Glargine (LANTUS SOLOSTAR) 100 UNIT/ML Solostar Pen Inject 50 Units into the skin at bedtime. Patient taking differently: Inject 40-50 Units into  the skin at bedtime.  11/13/15  Yes Rosita Fire, MD  insulin lispro (HUMALOG) 100 UNIT/ML injection Inject 5-10 Units into the skin 3 (three) times daily as needed for high blood sugar (Uses 3 timesdaily according to sliding scale).   Yes [provider]  levothyroxine (SYNTHROID, LEVOTHROID) 150 MCG tablet Take 150 mcg by mouth daily before breakfast.   Yes [provider]  omeprazole (PRILOSEC) 40 MG capsule Take 40 mg by mouth daily.   Yes [provider]  ondansetron (ZOFRAN) 4 MG tablet TAKE ONE TABLET BY MOUTH EVERY FOUR HOURS AS NEEDED FOR NAUSEA OR VOMITING. 08/21/16  Yes Annitta Needs, NP  traZODone (DESYREL) 100 MG tablet Take 100 mg by mouth at bedtime.    Yes [provider]    Family History Family History  Problem Relation Age of Onset  . Liver cancer Mother        remission for 2 years  . Cancer Mother        lung  . Dementia Father   . Heart attack Father   . Other Sister        liver trouble  . Diabetes Brother   . Hyperthyroidism Brother   . Dementia Maternal Grandmother   . Diabetes Paternal Grandmother   . Diabetes Paternal Grandfather   . Colon cancer Neg Hx     Social History Social History   Tobacco Use  . Smoking status: Current Every Day Smoker    Packs/day: 0.25    Years: 34.00    Pack years: 8.50    Types: Cigarettes  . Smokeless tobacco: Never Used  . Tobacco comment: smokes 2 cig daily  Substance Use Topics  . Alcohol use: No    Alcohol/week: 0.0 oz  . Drug use: No     Allergies   Phenytoin; Sulfonamide derivatives; and Tetracyclines & related   Review of Systems Review of Systems ROS: Statement: All systems negative except as marked or noted in the HPI; Constitutional: Negative for fever and chills. ; ; Eyes: Negative for eye pain, redness and discharge. ; ; ENMT: Negative for ear pain, hoarseness, nasal congestion, sinus pressure and sore throat. ; ; Cardiovascular: Negative for chest pain,  palpitations, diaphoresis, dyspnea and peripheral edema. ; ; Respiratory: Negative for cough, wheezing and stridor. ; ; Gastrointestinal: +N/V/D. Negative for abdominal pain, blood in stool, hematemesis, jaundice and rectal bleeding. . ; ; Genitourinary: Negative for dysuria, flank pain and hematuria. ; ; Musculoskeletal: Negative for back pain and neck pain. Negative for swelling and trauma.; ; Skin: Negative for pruritus, rash, abrasions, blisters, bruising and skin lesion.; ; Neuro: Negative for headache, lightheadedness and neck stiffness. Negative for weakness, altered mental status, extremity weakness, paresthesias, involuntary movement, seizure and +syncope  yesterday.      Physical Exam Updated Vital Signs BP (!) 145/69 (BP Location: Left Arm)   Pulse 92   Temp 97.8 F (36.6 C) (Oral)   Resp 20   Ht 5\' 4"  (1.626 m)   Wt 59.9 kg (132 lb)   SpO2 98%   BMI 22.66 kg/m    Patient Vitals for the past 24 hrs:  BP Temp Temp src Pulse Resp SpO2 Height Weight  03/06/18 1939 (!) 145/69 97.8 F (36.6 C) Oral 92 20 98 % - -  03/06/18 1934 - - - - - - 5\' 4"  (1.626 m) 59.9 kg (132 lb)     Physical Exam 1915: Physical examination:  Nursing notes reviewed; Vital signs and O2 SAT reviewed;  Constitutional: Well developed, Well nourished, In no acute distress; Head:  Normocephalic, atraumatic; Eyes: EOMI, PERRL, No scleral icterus; ENMT: Mouth and pharynx normal, Mucous membranes dry; Neck: Supple, Full range of motion, No lymphadenopathy; Cardiovascular: Regular rate and rhythm, No gallop; Respiratory: Breath sounds clear & equal bilaterally, No wheezes.  Speaking full sentences with ease, Normal respiratory effort/excursion; Chest: Nontender, Movement normal; Abdomen: Soft, Nontender, Nondistended, Normal bowel sounds. Rectal exam performed w/permission of pt and ED RN chaperone present.  Anal tone normal.  Non-tender, soft yellow-brown stool in rectal vault, heme neg.  No fissures, no external  hemorrhoids, no palp masses.;;; Genitourinary: No CVA tenderness; Extremities: Peripheral pulses normal, No tenderness, No edema, No calf edema or asymmetry.; Neuro: AA&Ox3, Major CN grossly intact.  Speech clear. No gross focal motor or sensory deficits in extremities.; Skin: Color normal, Warm, Dry.   ED Treatments / Results  Labs (all labs ordered are listed, but only abnormal results are displayed)   EKG EKG Interpretation  Date/Time:  Saturday March 06 2018 20:25:57 EDT Ventricular Rate:  87 PR Interval:    QRS Duration: 59 QT Interval:  404 QTC Calculation: 486 R Axis:   70 Text Interpretation:  Sinus rhythm Anteroseptal infarct, old Nonspecific T abnormalities, lateral leads When compared with ECG of 02/20/2014 No significant change was found Confirmed by Francine Graven [phone]) on 03/06/2018 8:43:57 PM   Radiology   Procedures Procedures (including critical care time)  Medications Ordered in ED Medications  ondansetron (ZOFRAN) injection 4 mg (4 mg Intravenous Given 03/06/18 1958)  dextrose 5 %-0.45 % sodium chloride infusion (has no administration in time range)  insulin regular bolus via infusion 0-10 Units (has no administration in time range)  insulin regular (NOVOLIN R,HUMULIN R) 100 Units in sodium chloride 0.9 % 100 mL (1 Units/mL) infusion (has no administration in time range)  dextrose 50 % solution 25 mL (has no administration in time range)  0.9 %  sodium chloride infusion (has no administration in time range)  acetaminophen (TYLENOL) tablet 650 mg (has no administration in time range)  sodium chloride 0.9 % bolus 1,000 mL (1,000 mLs Intravenous New Bag/Given 03/06/18 1958)  famotidine (PEPCID) IVPB 20 mg premix (20 mg Intravenous New Bag/Given 03/06/18 1958)     Initial Impression / Assessment and Plan / ED Course  I have reviewed the triage vital signs and the nursing notes.  Pertinent labs & imaging results that were available during my care of the patient  were reviewed by me and considered in my medical decision making (see chart for details).  MDM Reviewed: previous chart, nursing note and vitals Reviewed previous: labs and ECG Interpretation: labs, ECG and x-ray Total time providing critical care: 30-74 minutes. This excludes time spent  performing separately reportable procedures and services. Consults: admitting MD   CRITICAL CARE Performed by: Alfonzo Feller Total critical care time: 35 minutes Critical care time was exclusive of separately billable procedures and treating other patients. Critical care was necessary to treat or prevent imminent or life-threatening deterioration. Critical care was time spent personally by me on the following activities: development of treatment plan with patient and/or surrogate as well as nursing, discussions with consultants, evaluation of patient's response to treatment, examination of patient, obtaining history from patient or surrogate, ordering and performing treatments and interventions, ordering and review of laboratory studies, ordering and review of radiographic studies, pulse oximetry and re-evaluation of patient's condition.   Results for orders placed or performed during the hospital encounter of 03/06/18  CBC with Differential  Result Value Ref Range   WBC 10.9 (H) 4.0 - 10.5 K/uL   RBC 4.86 3.87 - 5.11 MIL/uL   Hemoglobin 15.5 (H) 12.0 - 15.0 g/dL   HCT 46.0 36.0 - 46.0 %   MCV 94.7 78.0 - 100.0 fL   MCH 31.9 26.0 - 34.0 pg   MCHC 33.7 30.0 - 36.0 g/dL   RDW 13.7 11.5 - 15.5 %   Platelets 255 150 - 400 K/uL   Neutrophils Relative % 75 %   Neutro Abs 8.1 (H) 1.7 - 7.7 K/uL   Lymphocytes Relative 19 %   Lymphs Abs 2.0 0.7 - 4.0 K/uL   Monocytes Relative 6 %   Monocytes Absolute 0.7 0.1 - 1.0 K/uL   Eosinophils Relative 0 %   Eosinophils Absolute 0.0 0.0 - 0.7 K/uL   Basophils Relative 0 %   Basophils Absolute 0.0 0.0 - 0.1 K/uL  Comprehensive metabolic panel  Result Value  Ref Range   Sodium 129 (L) 135 - 145 mmol/L   Potassium 4.1 3.5 - 5.1 mmol/L   Chloride 94 (L) 101 - 111 mmol/L   CO2 16 (L) 22 - 32 mmol/L   Glucose, Bld 424 (H) 65 - 99 mg/dL   BUN 30 (H) 6 - 20 mg/dL   Creatinine, Ser 1.25 (H) 0.44 - 1.00 mg/dL   Calcium 9.5 8.9 - 10.3 mg/dL   Total Protein 8.6 (H) 6.5 - 8.1 g/dL   Albumin 4.5 3.5 - 5.0 g/dL   AST 16 15 - 41 U/L   ALT 18 14 - 54 U/L   Alkaline Phosphatase 109 38 - 126 U/L   Total Bilirubin 0.9 0.3 - 1.2 mg/dL   GFR calc non Af Amer 46 (L) >60 mL/min   GFR calc Af Amer 54 (L) >60 mL/min   Anion gap 19 (H) 5 - 15  Lipase, blood  Result Value Ref Range   Lipase 18 11 - 51 U/L  Troponin I  Result Value Ref Range   Troponin I <0.03 <0.03 ng/mL  Protime-INR  Result Value Ref Range   Prothrombin Time 13.7 11.4 - 15.2 seconds   INR 1.06   CBG monitoring, ED  Result Value Ref Range   Glucose-Capillary 427 (H) 65 - 99 mg/dL   Dg Abd Acute W/chest Result Date: 03/06/2018 CLINICAL DATA:  Nausea, vomiting and diarrhea. EXAM: DG ABDOMEN ACUTE W/ 1V CHEST COMPARISON:  Chest x-ray and abdominal x-rays dated 02/07/2018. FINDINGS: Single-view of the chest: Heart size and mediastinal contours are within normal limits. Lungs are clear. No pleural effusion or pneumothorax seen. Osseous structures about the chest are unremarkable. Upright and supine views of the abdomen: Bowel gas pattern is nonobstructive. No evidence of  soft tissue mass or abnormal fluid collection. No evidence of free intraperitoneal air. No evidence of renal or ureteral calculi. Stable vascular phleboliths within the lower pelvis. Cholecystectomy clips in the RIGHT upper quadrant. No acute or suspicious osseous finding. IMPRESSION: 1. No active cardiopulmonary disease. No evidence of pneumonia or pulmonary edema. 2. No acute findings within the abdomen or pelvis. Nonobstructive bowel gas pattern. Electronically Signed   By: Franki Cabot M.D.   On: 03/06/2018 20:25    2100:  Na  corrects to 134 for elevated glucose. IVF bolus given for elevated BUN/Cr. Glucose elevated with elevated AG, IV insulin gtt started. Stool is heme negative. Dx and testing d/w pt.  Questions answered.  Verb understanding, agreeable to admit.  T/C returned from [facility]. Myna Hidalgo, case discussed, including:  HPI, pertinent PM/SHx, VS/PE, dx testing, ED course and treatment:  Agreeable to admit.     Final Clinical Impressions(s) / ED Diagnoses   Final diagnoses:  None    ED Discharge Orders    None       Francine Graven, DO 03/11/18 1621

## 2018-03-06 NOTE — ED Notes (Signed)
Patient transported to X-ray 

## 2018-03-07 ENCOUNTER — Encounter (HOSPITAL_COMMUNITY): Payer: Self-pay | Admitting: Emergency Medicine

## 2018-03-07 DIAGNOSIS — E860 Dehydration: Secondary | ICD-10-CM

## 2018-03-07 DIAGNOSIS — R197 Diarrhea, unspecified: Secondary | ICD-10-CM

## 2018-03-07 DIAGNOSIS — E1110 Type 2 diabetes mellitus with ketoacidosis without coma: Principal | ICD-10-CM

## 2018-03-07 DIAGNOSIS — Z794 Long term (current) use of insulin: Secondary | ICD-10-CM

## 2018-03-07 DIAGNOSIS — E119 Type 2 diabetes mellitus without complications: Secondary | ICD-10-CM

## 2018-03-07 DIAGNOSIS — R112 Nausea with vomiting, unspecified: Secondary | ICD-10-CM

## 2018-03-07 DIAGNOSIS — E039 Hypothyroidism, unspecified: Secondary | ICD-10-CM

## 2018-03-07 DIAGNOSIS — N289 Disorder of kidney and ureter, unspecified: Secondary | ICD-10-CM

## 2018-03-07 DIAGNOSIS — E871 Hypo-osmolality and hyponatremia: Secondary | ICD-10-CM

## 2018-03-07 LAB — BASIC METABOLIC PANEL
ANION GAP: 7 (ref 5–15)
Anion gap: 8 (ref 5–15)
BUN: 24 mg/dL — ABNORMAL HIGH (ref 6–20)
BUN: 19 mg/dL (ref 6–20)
CALCIUM: 8.9 mg/dL (ref 8.9–10.3)
CHLORIDE: 107 mmol/L (ref 101–111)
CO2: 24 mmol/L (ref 22–32)
CO2: 22 mmol/L (ref 22–32)
CREATININE: 0.83 mg/dL (ref 0.44–1.00)
Calcium: 8.5 mg/dL — ABNORMAL LOW (ref 8.9–10.3)
Chloride: 108 mmol/L (ref 101–111)
Creatinine, Ser: 0.77 mg/dL (ref 0.44–1.00)
GFR calc non Af Amer: >60 mL/min (ref >60)
GFR calc non Af Amer: >60 mL/min (ref >60)
GLUCOSE: 109 mg/dL — AB (ref 65–99)
Glucose, Bld: 198 mg/dL — ABNORMAL HIGH (ref 65–99)
Potassium: 3.8 mmol/L (ref 3.5–5.1)
Potassium: 3.5 mmol/L (ref 3.5–5.1)
SODIUM: 136 mmol/L (ref 135–145)
SODIUM: 140 mmol/L (ref 135–145)

## 2018-03-07 LAB — GLUCOSE, CAPILLARY
GLUCOSE-CAPILLARY: 95 mg/dL (ref 65–99)
GLUCOSE-CAPILLARY: 191 mg/dL — AB (ref 65–99)
GLUCOSE-CAPILLARY: 219 mg/dL — AB (ref 65–99)
Glucose-Capillary: 117 mg/dL — ABNORMAL HIGH (ref 65–99)
Glucose-Capillary: 119 mg/dL — ABNORMAL HIGH (ref 65–99)
Glucose-Capillary: 122 mg/dL — ABNORMAL HIGH (ref 65–99)
Glucose-Capillary: 134 mg/dL — ABNORMAL HIGH (ref 65–99)
Glucose-Capillary: 135 mg/dL — ABNORMAL HIGH (ref 65–99)
Glucose-Capillary: 184 mg/dL — ABNORMAL HIGH (ref 65–99)
Glucose-Capillary: 193 mg/dL — ABNORMAL HIGH (ref 65–99)
Glucose-Capillary: 22 mg/dL — CL (ref 65–99)

## 2018-03-07 LAB — RAPID URINE DRUG SCREEN, HOSP PERFORMED
AMPHETAMINES: NOT DETECTED
BENZODIAZEPINES: NOT DETECTED
Cocaine: POSITIVE — AB
OPIATES: NOT DETECTED
TETRAHYDROCANNABINOL: NOT DETECTED

## 2018-03-07 LAB — HEMOGLOBIN A1C
Hgb A1c MFr Bld: 9.6 % — ABNORMAL HIGH (ref 4.8–5.6)
Mean Plasma Glucose: 228.82 mg/dL

## 2018-03-07 LAB — MRSA PCR SCREENING: MRSA by PCR: NEGATIVE

## 2018-03-07 MED ORDER — INSULIN GLARGINE 100 UNIT/ML ~~LOC~~ SOLN
SUBCUTANEOUS | Status: AC
  Filled 2018-03-07: qty 10

## 2018-03-07 MED ORDER — INSULIN ASPART 100 UNIT/ML ~~LOC~~ SOLN
0.0000 [IU] | Freq: Three times a day (TID) | SUBCUTANEOUS | Status: DC
  Administered 2018-03-07: 2 [IU] via SUBCUTANEOUS

## 2018-03-07 MED ORDER — INSULIN ASPART 100 UNIT/ML ~~LOC~~ SOLN
0.0000 [IU] | Freq: Every day | SUBCUTANEOUS | Status: DC
  Administered 2018-03-07: 2 [IU] via SUBCUTANEOUS

## 2018-03-07 MED ORDER — INSULIN GLARGINE 100 UNIT/ML ~~LOC~~ SOLN
30.0000 [IU] | SUBCUTANEOUS | Status: DC
  Administered 2018-03-07 – 2018-03-08 (×2): 30 [IU] via SUBCUTANEOUS
  Filled 2018-03-07 (×3): qty 0.3

## 2018-03-07 MED ORDER — PHENOL 1.4 % MT LIQD
1.0000 | OROMUCOSAL | Status: DC | PRN
  Filled 2018-03-07: qty 177

## 2018-03-07 MED ORDER — GLUCOSE 40 % PO GEL
ORAL | Status: AC
  Filled 2018-03-07: qty 1

## 2018-03-07 MED ORDER — SODIUM CHLORIDE 0.9 % IV SOLN
INTRAVENOUS | Status: DC
Start: 2018-03-07 — End: 2018-03-08
  Administered 2018-03-07 – 2018-03-08 (×2): via INTRAVENOUS

## 2018-03-07 NOTE — Progress Notes (Signed)
BG 22 at 0800. Pt AO4 and responding to staff. Gave OJ 8 oz PO. Restarted Dextrose fluids. Will recheck in 102mins.

## 2018-03-07 NOTE — Progress Notes (Signed)
Rechecked BG 134. Pt eating breakfast. Will continue to monitor.

## 2018-03-07 NOTE — Progress Notes (Signed)
PROGRESS NOTE    [name]  VQQ:[id number] DOB: 26-Jul-1961 DOA: 03/06/2018 PCP: Rosita Fire, MD   Brief Narrative:  57 y.o. female with medical history significant for cocaine dependence, insulin-dependent diabetes mellitus, and hypothyroidism, now presenting to the emergency department for evaluation of diffuse abdominal pain, nausea, vomiting, and diarrhea.  Patient reports that she been in her usual state of health until approximately 3 days ago when she noted the insidious development of  generalized abdominal discomfort with nausea.  She reports that she is gone on to have multiple episodes of vomiting and diarrhea.  She also reports that her blood sugar has been running high despite her reported adherence with her insulin regimen.  She denies chest pain, headache, fevers, or chills.  She also denies any change in vision or hearing or focal numbness or weakness.  Assessment & Plan: 1-DKA (diabetic ketoacidoses) (HCC) -Resolved -Resume long-acting insulin and sliding scale -Modified carbohydrate diet has been ordered -Will follow CBGs and further adjust hypoglycemic regimen as needed -Continue normal saline while patient completely pick up with by mouth intake.  2-Nausea vomiting and diarrhea -most likely due to viral gastroenteritis  -improved/resolved  -continue PRN antiemetics   3-Hypothyroidism -continue synthroid   4-Insulin-requiring or dependent type II diabetes mellitus ([place]) -as mentioned above will adjust hypoglycemic regimen and follow CBG's -will check A1C  5-Mild renal insufficiency -resolved with IVF's -will follow renal function trend   6-dehydration and Hyponatremia -due to pseudohyponatremia and dehydration -resolved after IVF's and CBG's controlled -will follow electrolytes trend   DVT prophylaxis: Lovenox Code Status: DNR Family Communication: No family at bedside Disposition Plan: will transfer to med-surg; continue IVF's, continue advancing  diet and follow CBG's to further adjust hypoglycemic regimen. Hopefully home in am   Consultants:   None   Procedures:   See below for x-ray reports   Antimicrobials:  Anti-infectives (From admission, onward)   None     Subjective: Febrile, no chest pain, no nausea, no vomiting. Patient in no distress. Tolerating diet.   Objective: Vitals:   03/07/18 0600 03/07/18 0700 03/07/18 0800 03/07/18 1300  BP: (!) 87/43 (!) 91/46 (!) 118/49   Pulse: 66 76 85   Resp: 14  13   Temp:   98.1 F (36.7 C) 98 F (36.7 C)  TempSrc:   Oral Oral  SpO2: 96% 93% 93%   Weight:      Height:        Intake/Output Summary (Last 24 hours) at 03/07/2018 1511 Last data filed at 03/07/2018 0400 Gross per 24 hour  Intake 1426.65 ml  Output -  Net 1426.65 ml   Filed Weights   03/06/18 1934 03/07/18 0400  Weight: 59.9 kg (132 lb) 59.1 kg (130 lb 4.7 oz)    Examination: General exam: Alert, awake, oriented x 3; reports having generalized pain and feeling tired.  In no acute distress.  No further nausea vomiting.  Tolerating diet. Respiratory system: Clear to auscultation. Respiratory effort normal. Cardiovascular system:RRR. No murmurs, rubs, gallops. Gastrointestinal system: Abdomen is nondistended, soft and just vaguely tender to palpation. No organomegaly or masses felt. Normal bowel sounds heard. Central nervous system: Alert and oriented. No focal neurological deficits. Extremities: No C/C/E, +pedal pulses Skin: No rashes, lesions or ulcers Psychiatry: Judgement and insight appear normal. Mood & affect appropriate.   Data Reviewed: I have personally reviewed following labs and imaging studies  CBC: Recent Labs  Lab 03/06/18 1952  WBC 10.9*  NEUTROABS 8.1*  HGB 15.5*  HCT 46.0  MCV 94.7  PLT 093   Basic Metabolic Panel: Recent Labs  Lab 03/06/18 1952 03/07/18 0013 03/07/18 0412  NA 129* 136 140  K 4.1 3.8 3.5  CL 94* 107 108  CO2 16* 22 24  GLUCOSE 424* 198* 109*  BUN  30* 24* 19  CREATININE 1.25* 0.83 0.77  CALCIUM 9.5 8.5* 8.9   GFR: Estimated Creatinine Clearance: 66.2 mL/min (by C-G formula based on SCr of 0.77 mg/dL).   Liver Function Tests: Recent Labs  Lab 03/06/18 1952  AST 16  ALT 18  ALKPHOS 109  BILITOT 0.9  PROT 8.6*  ALBUMIN 4.5   Recent Labs  Lab 03/06/18 1952  LIPASE 18   Coagulation Profile: Recent Labs  Lab 03/06/18 1952  INR 1.06   Cardiac Enzymes: Recent Labs  Lab 03/06/18 1952  TROPONINI <0.03   CBG: Recent Labs  Lab 03/07/18 0352 03/07/18 0517 03/07/18 0801 03/07/18 0832 03/07/18 1136  GLUCAP 122* 95 22* 134* 191*   Urine analysis:    Component Value Date/Time   COLORURINE STRAW (A) 03/06/2018 2045   APPEARANCEUR HAZY (A) 03/06/2018 2045   LABSPEC 1.020 03/06/2018 2045   PHURINE 5.0 03/06/2018 2045   GLUCOSEU >=500 (A) 03/06/2018 2045   HGBUR SMALL (A) 03/06/2018 2045   BILIRUBINUR NEGATIVE 03/06/2018 2045   KETONESUR 80 (A) 03/06/2018 2045   PROTEINUR NEGATIVE 03/06/2018 2045   UROBILINOGEN 0.2 04/17/2014 1954   NITRITE NEGATIVE 03/06/2018 2045   LEUKOCYTESUR MODERATE (A) 03/06/2018 2045    Recent Results (from the past 240 hour(s))  MRSA PCR Screening     Status: None   Collection Time: 03/06/18 10:19 PM  Result Value Ref Range Status   MRSA by PCR NEGATIVE NEGATIVE Final    Comment:        The GeneXpert MRSA Assay (FDA approved for NASAL specimens only), is one component of a comprehensive MRSA colonization surveillance program. It is not intended to diagnose MRSA infection nor to guide or monitor treatment for MRSA infections. Performed at [facility], [address] [place], [place] [address]      Radiology Studies: Dg Abd Acute W/chest  Result Date: 03/06/2018 CLINICAL DATA:  Nausea, vomiting and diarrhea. EXAM: DG ABDOMEN ACUTE W/ 1V CHEST COMPARISON:  Chest x-ray and abdominal x-rays dated 02/07/2018. FINDINGS: Single-view of the chest: Heart size and mediastinal  contours are within normal limits. Lungs are clear. No pleural effusion or pneumothorax seen. Osseous structures about the chest are unremarkable. Upright and supine views of the abdomen: Bowel gas pattern is nonobstructive. No evidence of soft tissue mass or abnormal fluid collection. No evidence of free intraperitoneal air. No evidence of renal or ureteral calculi. Stable vascular phleboliths within the lower pelvis. Cholecystectomy clips in the RIGHT upper quadrant. No acute or suspicious osseous finding. IMPRESSION: 1. No active cardiopulmonary disease. No evidence of pneumonia or pulmonary edema. 2. No acute findings within the abdomen or pelvis. Nonobstructive bowel gas pattern. Electronically Signed   By: Franki Cabot M.D.   On: 03/06/2018 20:25     Scheduled Meds: . dextrose      . enoxaparin (LOVENOX) injection  40 mg Subcutaneous Q24H  . gabapentin  200 mg Oral QID  . insulin aspart  0-5 Units Subcutaneous QHS  . insulin aspart  0-9 Units Subcutaneous TID WC  . insulin glargine  30 Units Subcutaneous Q24H  . levothyroxine  150 mcg Oral QAC breakfast  . pantoprazole  40 mg Oral Daily  .  traZODone  100 mg Oral QHS   Continuous Infusions: . sodium chloride 100 mL/hr at 03/07/18 0844  . famotidine (PEPCID) IV Stopped (03/07/18 0845)     LOS: 1 day    Time spent: 30 minutes     Barton Dubois, MD [facility] Pager [phone]  If 7PM-7AM, please contact night-coverage [email or web address] Password [email or web address] 03/07/2018, 3:11 PM

## 2018-03-07 NOTE — Progress Notes (Signed)
Insulin gtt stopped. Patient c/o intermittent nausea throughout night shift, no active vomiting or diarrhea during shift.

## 2018-03-08 DIAGNOSIS — J310 Chronic rhinitis: Secondary | ICD-10-CM

## 2018-03-08 DIAGNOSIS — J029 Acute pharyngitis, unspecified: Secondary | ICD-10-CM | POA: Diagnosis not present

## 2018-03-08 DIAGNOSIS — N179 Acute kidney failure, unspecified: Secondary | ICD-10-CM

## 2018-03-08 DIAGNOSIS — B379 Candidiasis, unspecified: Secondary | ICD-10-CM

## 2018-03-08 DIAGNOSIS — F320 Major depressive disorder, single episode, mild: Secondary | ICD-10-CM | POA: Diagnosis not present

## 2018-03-08 DIAGNOSIS — E1142 Type 2 diabetes mellitus with diabetic polyneuropathy: Secondary | ICD-10-CM | POA: Diagnosis not present

## 2018-03-08 DIAGNOSIS — J449 Chronic obstructive pulmonary disease, unspecified: Secondary | ICD-10-CM | POA: Diagnosis not present

## 2018-03-08 LAB — BASIC METABOLIC PANEL
ANION GAP: 7 (ref 5–15)
BUN: 11 mg/dL (ref 6–20)
CO2: 26 mmol/L (ref 22–32)
Calcium: 8.4 mg/dL — ABNORMAL LOW (ref 8.9–10.3)
Chloride: 108 mmol/L (ref 101–111)
Creatinine, Ser: 0.74 mg/dL (ref 0.44–1.00)
GLUCOSE: 163 mg/dL — AB (ref 65–99)
POTASSIUM: 3.5 mmol/L (ref 3.5–5.1)
Sodium: 141 mmol/L (ref 135–145)

## 2018-03-08 LAB — HIV ANTIBODY (ROUTINE TESTING W REFLEX): HIV SCREEN 4TH GENERATION: NONREACTIVE

## 2018-03-08 LAB — URINE CULTURE: Culture: NO GROWTH

## 2018-03-08 LAB — GLUCOSE, CAPILLARY
GLUCOSE-CAPILLARY: 41 mg/dL — AB (ref 65–99)
GLUCOSE-CAPILLARY: 76 mg/dL (ref 65–99)
Glucose-Capillary: 186 mg/dL — ABNORMAL HIGH (ref 65–99)
Glucose-Capillary: 67 mg/dL (ref 65–99)
Glucose-Capillary: 106 mg/dL — ABNORMAL HIGH (ref 65–99)

## 2018-03-08 MED ORDER — FLUCONAZOLE 100 MG PO TABS: 100.0000 mg | ORAL_TABLET | Freq: Every day | ORAL | 0 refills | Status: AC

## 2018-03-08 MED ORDER — LORATADINE 10 MG PO TABS: 10.0000 mg | ORAL_TABLET | Freq: Every day | ORAL | 1 refills | Status: DC | PRN

## 2018-03-08 MED ORDER — INSULIN GLARGINE 100 UNIT/ML SOLOSTAR PEN: 22.0000 [IU] | PEN_INJECTOR | Freq: Every day | SUBCUTANEOUS | Status: DC

## 2018-03-08 MED ORDER — HYDROCODONE-ACETAMINOPHEN 5-325 MG PO TABS: 1.0000 | ORAL_TABLET | Freq: Four times a day (QID) | ORAL | 0 refills | Status: DC | PRN

## 2018-03-08 MED ORDER — FAMOTIDINE 20 MG PO TABS: 20.0000 mg | ORAL_TABLET | Freq: Two times a day (BID) | ORAL | Status: DC
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
## 2018-03-08 NOTE — Discharge Summary (Addendum)
Physician Discharge Summary  [name] TDV:[id number] DOB: 05/14/1961 DOA: 03/06/2018  PCP: Rosita Fire, MD  Admit date: 03/06/2018 Discharge date: 03/08/2018  Time spent: 35 minutes  Recommendations for Outpatient Follow-up:  1-repeat basic metabolic panel to follow electrolytes and renal function 2-follow complete resolution of her yeast infection 3-close follow-up to patient's CBGs/A1c and further adjustment on hypoglycemic regimen as needed; mild hypoglycemic events in am; lantus dose adjusted to 22 units.    Discharge Diagnoses:  Principal Problem:   DKA (diabetic ketoacidoses) ([place]) Active Problems:   Nausea vomiting and diarrhea   Hypothyroidism   Insulin-requiring or dependent type II diabetes mellitus ([facility])   Mild renal insufficiency   Hyponatremia   Dehydration   AKI (acute kidney injury) ([place])   Yeast infection   Rhinitis   Discharge Condition: Stable and improved.  Patient discharged with instruction to follow-up with PCP in 10 days.  Diet recommendation: Modified carbohydrate diet.  Filed Weights   03/06/18 1934 03/07/18 0400  Weight: 59.9 kg (132 lb) 59.1 kg (130 lb 4.7 oz)    History of present illness:  57 y.o.femalewith medical history significant forcocaine dependence, insulin-dependent diabetes mellitus, and hypothyroidism, now presenting to the emergency department for evaluation of diffuse abdominal pain, nausea, vomiting, and diarrhea. Patient reports that she been in her usual state of health until approximately 3 days ago when she noted the insidious development of generalized abdominal discomfort with nausea. She reports that she is gone on to have multiple episodes of vomiting and diarrhea. She also reports that her blood sugar has been running high despite her reported adherence with her insulin regimen. She denies chest pain, headache, fevers, or chills. She also denies any change in vision or hearing or focal numbness or  weakness.  Hospital Course:  1-DKA (diabetic ketoacidoses) ([place]) -Resolved -Resume long-acting insulin and sliding scale -Modified carbohydrate diet has been encouraged -Patient advised to keep himself well-hydrated and to follow-up with PCP in 10 days. -Further adjustment to her hypoglycemic regimen to be done based on CBGs fluctuation and further A1c improvement/deterioration.  PCP to further adjust as needed.  2-Nausea vomiting and diarrhea -most likely due to viral gastroenteritis  -improved/resolved  -Patient advised to keep yourself well-hydrated and to continue using as needed antiemetics. -Continue PPI at discharge.  3-Hypothyroidism -continue synthroid   4-Insulin-requiring or dependent type II diabetes mellitus ([place]) -Advised to be compliant with her insulin therapy and to follow modified carbohydrate diets. -A1c 9.6; which is improved in comparison to prior records of 11.4 range. -Outpatient follow-up with PCP to further adjust hypoglycemic regimen as needed. -some lows in am; will adjust lantus dose.  5-Mild renal insufficiency -resolved with IVF's -Renal function is back to normal -Recommending a repeat basic metabolic panel at follow-up visit to reassess renal function trend.  6-dehydration and Hyponatremia -due to pseudohyponatremia and dehydration -resolved after IVF's and CBG's controlled -Recommend repeat basic metabolic panel at follow-up visit to reassess electrolytes trend.  7-yeast infection -We will treat with 5 days of Diflucan -Patient advised to keep herself well-hydrated and to follow good hygiene.  8-Gastroesophageal reflux disease -Continue PPI.  9-rhinitis -Patient discharged with prescription for Claritin and also instructions to continue using Flonase. -15 tablets of vicodin prescribed to use as needed for pain -patient with active GERD/vomiting and recent AKI; NSAID's avoided acutely in this setting.   Procedures:  See below for  x-ray reports.  Consultations:  None  Discharge Exam: Vitals:   03/08/18 0800 03/08/18 0900  BP: Marland Kitchen)  120/94 132/66  Pulse:  74  Resp: 13 14  Temp:    SpO2:  (!) 89%    General exam: Alert, awake, oriented x 3; reports having generalized pain, especially rhinitis and congestion.  In no acute distress.  No further nausea vomiting.  Tolerating diet.  Patient also expressed white, cottage cheese appearance vaginal discharge.   Respiratory system: Clear to auscultation. Respiratory effort normal. Cardiovascular system:RRR. No murmurs, rubs, gallops. Gastrointestinal system: Abdomen is nondistended, soft and just vaguely tender to palpation. No organomegaly or masses felt. Normal bowel sounds heard. Central nervous system: Alert and oriented. No focal neurological deficits. Extremities: No C/C/E, +pedal pulses Skin: No rashes, lesions or ulcers Psychiatry: Judgement and insight appear normal. Mood & affect appropriate.     Discharge Instructions   Discharge Instructions    Diet Carb Modified   Complete by:  As directed    Discharge instructions   Complete by:  As directed    Take medications as prescribed Keep yourself well hydrated and follow modified carbohydrates diet Arrange follow-up with PCP in 10 days     Allergies as of 03/08/2018      Reactions   Phenytoin Anaphylaxis   Reaction: SJS (STEVENS JOHNSON SYNDROME)   Sulfonamide Derivatives Nausea And Vomiting   Tetracyclines & Related Nausea And Vomiting      Medication List    TAKE these medications   albuterol (2.5 MG/3ML) 0.083% nebulizer solution Commonly known as:  PROVENTIL Take 2.5 mg by nebulization every 6 (six) hours as needed for wheezing or shortness of breath.   albuterol 108 (90 Base) MCG/ACT inhaler Commonly known as:  PROVENTIL HFA;VENTOLIN HFA Inhale 2 puffs into the lungs every 6 (six) hours as needed for wheezing or shortness of breath.   fluconazole 100 MG tablet Commonly known as:   DIFLUCAN Take 1 tablet (100 mg total) by mouth daily for 5 days.   fluticasone 50 MCG/ACT nasal spray Commonly known as:  FLONASE Place 2 sprays into both nostrils daily as needed for allergies or rhinitis.   gabapentin 100 MG capsule Commonly known as:  NEURONTIN Take 200 mg by mouth 4 (four) times daily.   HYDROcodone-acetaminophen 5-325 MG tablet Commonly known as:  NORCO/VICODIN Take 1 tablet by mouth every 6 (six) hours as needed for severe pain.   Insulin Glargine 100 UNIT/ML Solostar Pen Commonly known as:  LANTUS SOLOSTAR Inject 22 Units into the skin at bedtime. What changed:  how much to take   insulin lispro 100 UNIT/ML injection Commonly known as:  HUMALOG Inject 5-10 Units into the skin 3 (three) times daily as needed for high blood sugar (Uses 3 timesdaily according to sliding scale).   levothyroxine 150 MCG tablet Commonly known as:  SYNTHROID, LEVOTHROID Take 150 mcg by mouth daily before breakfast.   loratadine 10 MG tablet Commonly known as:  CLARITIN Take 1 tablet (10 mg total) by mouth daily as needed for allergies or rhinitis.   omeprazole 40 MG capsule Commonly known as:  PRILOSEC Take 40 mg by mouth daily.   ondansetron 4 MG tablet Commonly known as:  ZOFRAN TAKE ONE TABLET BY MOUTH EVERY FOUR HOURS AS NEEDED FOR NAUSEA OR VOMITING.   traZODone 100 MG tablet Commonly known as:  DESYREL Take 100 mg by mouth at bedtime.      Allergies  Allergen Reactions  . Phenytoin Anaphylaxis    Reaction: SJS (STEVENS JOHNSON SYNDROME)  . Sulfonamide Derivatives Nausea And Vomiting  . Tetracyclines & Related Nausea  And Vomiting   Follow-up Information    Rosita Fire, MD. Schedule an appointment as soon as possible for a visit in 10 day(s).   Specialty:  Internal Medicine Contact information: [place] [address] [phone]           The results of significant diagnostics from this hospitalization (including  imaging, microbiology, ancillary and laboratory) are listed below for reference.    Significant Diagnostic Studies: Dg Abd Acute W/chest  Result Date: 03/06/2018 CLINICAL DATA:  Nausea, vomiting and diarrhea. EXAM: DG ABDOMEN ACUTE W/ 1V CHEST COMPARISON:  Chest x-ray and abdominal x-rays dated 02/07/2018. FINDINGS: Single-view of the chest: Heart size and mediastinal contours are within normal limits. Lungs are clear. No pleural effusion or pneumothorax seen. Osseous structures about the chest are unremarkable. Upright and supine views of the abdomen: Bowel gas pattern is nonobstructive. No evidence of soft tissue mass or abnormal fluid collection. No evidence of free intraperitoneal air. No evidence of renal or ureteral calculi. Stable vascular phleboliths within the lower pelvis. Cholecystectomy clips in the RIGHT upper quadrant. No acute or suspicious osseous finding. IMPRESSION: 1. No active cardiopulmonary disease. No evidence of pneumonia or pulmonary edema. 2. No acute findings within the abdomen or pelvis. Nonobstructive bowel gas pattern. Electronically Signed   By: Franki Cabot M.D.   On: 03/06/2018 20:25   Dg Abd Acute W/chest  Result Date: 02/07/2018 CLINICAL DATA:  Vomiting, diarrhea, take by EXAM: DG ABDOMEN ACUTE W/ 1V CHEST COMPARISON:  Chest x-ray 05/28/2017 FINDINGS: Prior cholecystectomy. Moderate stool burden in the colon. Calcified phleboliths in the pelvis. No obstruction or free air. No organomegaly. Heart and mediastinal contours are within normal limits. No focal opacities or effusions. No acute bony abnormality. IMPRESSION: Moderate stool burden. No acute findings. No active cardiopulmonary disease. Electronically Signed   By: Rolm Baptise M.D.   On: 02/07/2018 20:04    Microbiology: Recent Results (from the past 240 hour(s))  Culture, Urine     Status: None   Collection Time: 03/06/18  8:45 PM  Result Value Ref Range Status   Specimen Description   Final    URINE, CLEAN  CATCH Performed at [facility], [address] [place], [place] [address]    Special Requests   Final    NONE Performed at [facility], [address], [place], [place] [address]    Culture   Final    NO GROWTH Performed at [facility], [place] [address], [place], [place] [address]    Report Status 03/08/2018 FINAL  Final  MRSA PCR Screening     Status: None   Collection Time: 03/06/18 10:19 PM  Result Value Ref Range Status   MRSA by PCR NEGATIVE NEGATIVE Final    Comment:        The GeneXpert MRSA Assay (FDA approved for NASAL specimens only), is one component of a comprehensive MRSA colonization surveillance program. It is not intended to diagnose MRSA infection nor to guide or monitor treatment for MRSA infections. Performed at [facility], [address], [place], [place] [address]      Labs: Basic Metabolic Panel: Recent Labs  Lab 03/06/18 1952 03/07/18 0013 03/07/18 0412 03/08/18 0422  NA 129* 136 140 141  K 4.1 3.8 3.5 3.5  CL 94* 107 108 108  CO2 16* 22 24 26   GLUCOSE 424* 198* 109* 163*  BUN 30* 24* 19 11  CREATININE 1.25* 0.83 0.77 0.74  CALCIUM 9.5 8.5* 8.9 8.4*  Liver Function Tests: Recent Labs  Lab 03/06/18 1952  AST 16  ALT 18  ALKPHOS 109  BILITOT 0.9  PROT 8.6*  ALBUMIN 4.5   Recent Labs  Lab 03/06/18 1952  LIPASE 18   CBC: Recent Labs  Lab 03/06/18 1952  WBC 10.9*  NEUTROABS 8.1*  HGB 15.5*  HCT 46.0  MCV 94.7  PLT 255   Cardiac Enzymes: Recent Labs  Lab 03/06/18 1952  TROPONINI <0.03    CBG: Recent Labs  Lab 03/07/18 2232 03/08/18 0320 03/08/18 0720 03/08/18 0754 03/08/18 1024  GLUCAP 219* 186* 41* 76 67     Signed:  Barton Dubois MD.  [facility] 03/08/2018, 11:06 AM

## 2018-03-08 NOTE — Progress Notes (Signed)
PHARMACIST - PHYSICIAN COMMUNICATION  DR:   TRH  CONCERNING: IV to Oral Route Change Policy  RECOMMENDATION: This patient is receiving PEPCID by the intravenous route.  Based on criteria approved by the Pharmacy and Therapeutics Committee, the intravenous medication(s) is/are being converted to the equivalent oral dose form(s).   DESCRIPTION: These criteria include:  The patient is eating (either orally or via tube) and/or has been taking other orally administered medications for a least 24 hours  The patient has no evidence of active gastrointestinal bleeding or impaired GI absorption (gastrectomy, short bowel, patient on TNA or NPO).  If you have questions about this conversion, please contact the Pharmacy Department  [x]   [phone] )  [facility] []   [phone] )  [facility] []   [phone] )  [facility] []   [phone] )  [facility] []   [phone] )  [place], [facility] 03/08/2018 11:13 AM
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
## 2018-03-19 DIAGNOSIS — E1142 Type 2 diabetes mellitus with diabetic polyneuropathy: Secondary | ICD-10-CM | POA: Diagnosis not present

## 2018-03-19 DIAGNOSIS — E039 Hypothyroidism, unspecified: Secondary | ICD-10-CM | POA: Diagnosis not present

## 2018-03-19 DIAGNOSIS — E785 Hyperlipidemia, unspecified: Secondary | ICD-10-CM | POA: Diagnosis not present

## 2018-03-19 DIAGNOSIS — K219 Gastro-esophageal reflux disease without esophagitis: Secondary | ICD-10-CM | POA: Diagnosis not present

## 2018-03-31 ENCOUNTER — Ambulatory Visit: Payer: Medicare Other | Admitting: Advanced Practice Midwife

## 2018-08-12 ENCOUNTER — Ambulatory Visit (INDEPENDENT_AMBULATORY_CARE_PROVIDER_SITE_OTHER): Payer: Medicare Other | Admitting: Advanced Practice Midwife

## 2018-08-12 ENCOUNTER — Encounter: Payer: Self-pay | Admitting: Advanced Practice Midwife

## 2018-08-12 ENCOUNTER — Ambulatory Visit: Payer: Medicare Other | Admitting: Advanced Practice Midwife

## 2018-08-12 ENCOUNTER — Encounter (INDEPENDENT_AMBULATORY_CARE_PROVIDER_SITE_OTHER): Payer: Self-pay

## 2018-08-12 ENCOUNTER — Other Ambulatory Visit: Payer: Self-pay

## 2018-08-12 VITALS — BP 171/78 | HR 69 | Ht 64.0 in | Wt 146.0 lb

## 2018-08-12 DIAGNOSIS — N898 Other specified noninflammatory disorders of vagina: Secondary | ICD-10-CM | POA: Diagnosis not present

## 2018-08-12 DIAGNOSIS — A599 Trichomoniasis, unspecified: Secondary | ICD-10-CM

## 2018-08-12 MED ORDER — METRONIDAZOLE 500 MG PO TABS: 500.0000 mg | ORAL_TABLET | Freq: Two times a day (BID) | ORAL | 0 refills | Status: AC

## 2018-08-12 MED ORDER — ONDANSETRON 4 MG PO TBDP: 4.0000 mg | ORAL_TABLET | Freq: Four times a day (QID) | ORAL | 2 refills | Status: DC | PRN

## 2018-08-12 NOTE — Patient Instructions (Signed)
Trichomonas Test Why am I having this test? The trichomonas test is done to diagnose trichomoniasis, an infection caused by an organism called Trichomonas. Trichomoniasis is a sexually transmitted infection (STI). In women, it causes vaginal infections. In men, it can cause the tube that carries urine (urethra) to become inflamed (urethritis). You may have this test as a part of a routine screening for STIs or if you have symptoms of trichomoniasis. To perform the test, your health care provider will take a sample of discharge. The sample is taken from the vagina or cervix in women and from the urethra in men. A urine sample can also be used for testing. What do the results mean? It is your responsibility to obtain your test results. Ask the lab or department performing the test when and how you will get your results. Contact your health care provider to discuss any questions you have about your results. Negative test results A negative test means you do not have trichomoniasis. Follow your health care provider's directions about any follow-up testing. Positive test results A positive test result means you have an active infection that needs to be treated with antibiotic medicine. All your current sexual partners must also be treated or it is likely you will get reinfected. If your test is positive, your health care provider will start you on medicine and may advise you to:  Not have sexual intercourse until your infection has cleared up.  Use a latex condom properly every time you have sexual intercourse.  Limit the number of sexual partners you have. The more partners you have, the greater your risk of contracting trichomoniasis or another STI.  Tell all sexual partners about your infection so they can also be treated and to prevent reinfection.  Talk with your health care provider to discuss your results, treatment options, and if necessary, the need for more tests. Talk with your health care  provider if you have any questions about your results. This information is not intended to replace advice given to you by your health care provider. Make sure you discuss any questions you have with your health care provider. Document Released: 10/11/2004 Document Revised: 04/09/2016 Document Reviewed: 09/20/2013 Elsevier Interactive Patient Education  2017 [employer].

## 2018-08-12 NOTE — Progress Notes (Signed)
CHIEF COMPLAINT/HPI:  57 y.o. female complains of frothy and yellow vaginal discharge for a week . Denies abnormal vaginal bleeding, significant pelvic pain or fever.  some itch, some irritation, no odor. No UTI symptoms.   Had this 2 years ago, he never got treated, slept with him again recently.  No LMP recorded. Patient has had a hysterectomy.  Has not tried an OTC product.   Review of Systems  Constitutional: Negative for fever and chills Eyes: Negative for visual disturbances Respiratory: Negative for shortness of breath, dyspnea Cardiovascular: Negative for chest pain or palpitations  Gastrointestinal: Negative for vomiting, diarrhea and constipation Genitourinary: Negative for dysuria and urgency Musculoskeletal: Negative for back pain, joint pain, myalgias  Neurological: Negative for dizziness and headaches    Past Medical History: Past Medical History:  Diagnosis Date  . Anxiety   . Arthritis    rheumatoid   . BV (bacterial vaginosis) 05/23/2015  . Chronic abdominal pain   . COPD (chronic obstructive pulmonary disease) ([place])   . Diabetes mellitus    type I  . GERD (gastroesophageal reflux disease)   . Hiatal hernia   . Hyperlipidemia   . Hypertension   . Hypothyroid   . Polyneuropathy in diabetes ([place])   . PUD (peptic ulcer disease)    ? remote past, no reports from [facility] or [facility], states possibly Dr. Laural Golden.   . Seizures ([place])    had in the past with hypoglycemia.  . Stroke ([place])    no deficits.  . Trichimoniasis   . Unspecified polyarthropathy or polyarthritis, site unspecified   . Vaginal discharge 05/23/2015    Past Surgical History: Past Surgical History:  Procedure Laterality Date  . ABDOMINAL HYSTERECTOMY    . APPENDECTOMY    . BLADDER REPAIR    . CHOLECYSTECTOMY N/A 06/19/2017   Procedure: LAPAROSCOPIC CHOLECYSTECTOMY;  Surgeon: Aviva Signs, MD;  Location: [facility];  Service: General;  Laterality: N/A;  . COLONOSCOPY N/A 06/01/2015   [id number]  hemorrhoid diminutive rectosigmoid otherwise normal  . ESOPHAGOGASTRODUODENOSCOPY     in remote past, unclear who performed. No op notes from [facility] or [facility] (pt thought Dr. Laural Golden)  . ESOPHAGOGASTRODUODENOSCOPY  02/12/2012   Dr. Gala Romney:  Normal esophagus status post 69 F dilation. Small hiatal hernia. mild chronic gastritis  . ESOPHAGOGASTRODUODENOSCOPY N/A 02/21/2014   GSU:[id number] exudative reflux esophagitis-and + Candida esophagitis.  Marland Kitchen ESOPHAGOGASTRODUODENOSCOPY N/A 06/01/2015   XYV:OPFYT HH otherwise normal  . TONSILLECTOMY      Obstetrical History: OB History    Gravida  1   Para  1   Term  1   Preterm      AB      Living  1     SAB      TAB      Ectopic      Multiple      Live Births  1           Social History: Social History   Socioeconomic History  . Marital status: Divorced    Spouse name: Not on file  . Number of children: Not on file  . Years of education: Not on file  . Highest education level: Not on file  Occupational History  . Not on file  Social Needs  . Financial resource strain: Not on file  . Food insecurity:    Worry: Not on file    Inability: Not on file  . Transportation needs:    Medical: Not on file  Non-medical: Not on file  Tobacco Use  . Smoking status: Current Every Day Smoker    Packs/day: 0.25    Years: 34.00    Pack years: 8.50    Types: Cigarettes  . Smokeless tobacco: Never Used  . Tobacco comment: smokes 2 cig daily  Substance and Sexual Activity  . Alcohol use: No    Alcohol/week: 0.0 standard drinks  . Drug use: No  . Sexual activity: Yes    Birth control/protection: Surgical    Comment: hyst  Lifestyle  . Physical activity:    Days per week: Not on file    Minutes per session: Not on file  . Stress: Not on file  Relationships  . Social connections:    Talks on phone: Not on file    Gets together: Not on file    Attends religious service: Not on file    Active member of club or organization: Not  on file    Attends meetings of clubs or organizations: Not on file    Relationship status: Not on file  Other Topics Concern  . Not on file  Social History Narrative  . Not on file    Family History: Family History  Problem Relation Age of Onset  . Liver cancer Mother        remission for 2 years  . Cancer Mother        lung  . Dementia Father   . Heart attack Father   . Other Sister        liver trouble  . Diabetes Brother   . Hyperthyroidism Brother   . Dementia Maternal Grandmother   . Diabetes Paternal Grandmother   . Diabetes Paternal Grandfather   . Colon cancer Neg Hx     Allergies: Allergies  Allergen Reactions  . Phenytoin Anaphylaxis    Reaction: SJS (STEVENS JOHNSON SYNDROME)  . Sulfonamide Derivatives Nausea And Vomiting  . Tetracyclines & Related Nausea And Vomiting        PHYSICAL EXAM: Physical Examination: General appearance - well appearing, and in no distress Mental status - alert, oriented to person, place, and time Chest:  Normal respiratory effort Heart - normal rate and regular rhythm Abdomen:  Soft, nontender Pelvic: SSE:  Yellow frothy discharge, wet prep + trich, no clue, + wbc, no yeast Musculoskeletal:  Normal range of motion without pain Extremities:  No edema    Labs: No results found for this or any previous visit (from the past 24 hour(s)).   Assessment: Patient Active Problem List   Diagnosis Date Noted  . AKI (acute kidney injury) ([place])   . Yeast infection   . Rhinitis   . Dehydration   . Insulin-requiring or dependent type II diabetes mellitus ([place]) 03/06/2018  . Mild renal insufficiency 03/06/2018  . Hyponatremia 03/06/2018  . Chronic cholecystitis   . Depression 11/08/2015  . Hypothyroidism 11/08/2015  . History of colonic polyps   . Constipation 05/16/2015  . DKA (diabetic ketoacidoses) ([place]) 02/20/2014  . Nausea vomiting and diarrhea 02/20/2014  . Cocaine dependence ([place]) 03/03/2012  . GERD  (gastroesophageal reflux disease) 01/29/2012  . MIXED HYPERLIPIDEMIA 03/26/2010  . SMOKER 03/26/2010    Plan:  No orders of the defined types were placed in this encounter.  Trichomonas rx flagyl 500mg  BID x7; don't have unprotected sex w/this man until he gets treated Check GC/CHL  Christin Fudge 08/12/18 2:25 PM

## 2018-08-14 LAB — GC/CHLAMYDIA PROBE AMP
Chlamydia trachomatis, NAA: NEGATIVE
Neisseria gonorrhoeae by PCR: NEGATIVE

## 2018-08-26 ENCOUNTER — Other Ambulatory Visit (HOSPITAL_COMMUNITY): Payer: Self-pay | Admitting: Internal Medicine

## 2018-08-26 ENCOUNTER — Ambulatory Visit (HOSPITAL_COMMUNITY)
Admission: RE | Admit: 2018-08-26 | Discharge: 2018-08-26 | Disposition: A | Discharge status: Home / Self Care | Payer: Medicare Other | Source: Ambulatory Visit | Attending: Internal Medicine | Admitting: Internal Medicine

## 2018-08-26 DIAGNOSIS — J441 Chronic obstructive pulmonary disease with (acute) exacerbation: Secondary | ICD-10-CM | POA: Diagnosis not present

## 2018-08-26 DIAGNOSIS — F17200 Nicotine dependence, unspecified, uncomplicated: Secondary | ICD-10-CM | POA: Diagnosis not present

## 2018-08-26 DIAGNOSIS — F331 Major depressive disorder, recurrent, moderate: Secondary | ICD-10-CM | POA: Diagnosis not present

## 2018-08-26 DIAGNOSIS — Z1331 Encounter for screening for depression: Secondary | ICD-10-CM | POA: Diagnosis not present

## 2018-08-26 DIAGNOSIS — Z1389 Encounter for screening for other disorder: Secondary | ICD-10-CM | POA: Diagnosis not present

## 2018-08-26 DIAGNOSIS — R059 Cough, unspecified: Secondary | ICD-10-CM

## 2018-08-26 DIAGNOSIS — R05 Cough: Secondary | ICD-10-CM | POA: Diagnosis not present

## 2018-08-26 DIAGNOSIS — Z09 Encounter for follow-up examination after completed treatment for conditions other than malignant neoplasm: Secondary | ICD-10-CM

## 2018-08-26 DIAGNOSIS — R0989 Other specified symptoms and signs involving the circulatory and respiratory systems: Secondary | ICD-10-CM | POA: Diagnosis not present

## 2018-08-26 DIAGNOSIS — M2550 Pain in unspecified joint: Secondary | ICD-10-CM | POA: Diagnosis not present

## 2018-08-26 DIAGNOSIS — N6320 Unspecified lump in the left breast, unspecified quadrant: Secondary | ICD-10-CM

## 2018-08-26 DIAGNOSIS — E039 Hypothyroidism, unspecified: Secondary | ICD-10-CM | POA: Diagnosis not present

## 2018-08-26 DIAGNOSIS — R921 Mammographic calcification found on diagnostic imaging of breast: Secondary | ICD-10-CM

## 2018-08-26 DIAGNOSIS — F419 Anxiety disorder, unspecified: Secondary | ICD-10-CM | POA: Diagnosis not present

## 2018-08-26 DIAGNOSIS — E1140 Type 2 diabetes mellitus with diabetic neuropathy, unspecified: Secondary | ICD-10-CM | POA: Diagnosis not present

## 2018-08-26 DIAGNOSIS — Z0000 Encounter for general adult medical examination without abnormal findings: Secondary | ICD-10-CM | POA: Diagnosis not present

## 2018-08-26 DIAGNOSIS — R5381 Other malaise: Secondary | ICD-10-CM | POA: Diagnosis not present

## 2018-08-26 DIAGNOSIS — Z23 Encounter for immunization: Secondary | ICD-10-CM | POA: Diagnosis not present

## 2018-08-26 DIAGNOSIS — Z79899 Other long term (current) drug therapy: Secondary | ICD-10-CM | POA: Diagnosis not present

## 2018-08-26 DIAGNOSIS — M130 Polyarthritis, unspecified: Secondary | ICD-10-CM | POA: Diagnosis not present

## 2018-08-26 DIAGNOSIS — J449 Chronic obstructive pulmonary disease, unspecified: Secondary | ICD-10-CM | POA: Diagnosis not present

## 2018-08-26 DIAGNOSIS — E1142 Type 2 diabetes mellitus with diabetic polyneuropathy: Secondary | ICD-10-CM | POA: Diagnosis not present

## 2018-08-26 DIAGNOSIS — E785 Hyperlipidemia, unspecified: Secondary | ICD-10-CM | POA: Diagnosis not present

## 2018-08-26 DIAGNOSIS — K219 Gastro-esophageal reflux disease without esophagitis: Secondary | ICD-10-CM | POA: Diagnosis not present

## 2018-09-28 ENCOUNTER — Encounter (HOSPITAL_COMMUNITY): Payer: Self-pay

## 2018-09-28 ENCOUNTER — Ambulatory Visit (HOSPITAL_COMMUNITY): Payer: Medicare Other

## 2018-10-08 IMAGING — DX DG ABDOMEN ACUTE W/ 1V CHEST
3 series · 3 of 3 positions shown · non-contrast
Comparison: Chest x-ray and abdominal x-rays dated 02/07/2018.

CLINICAL DATA: Nausea, vomiting and diarrhea.

EXAM:
DG ABDOMEN ACUTE W/ 1V CHEST

[chest pa]
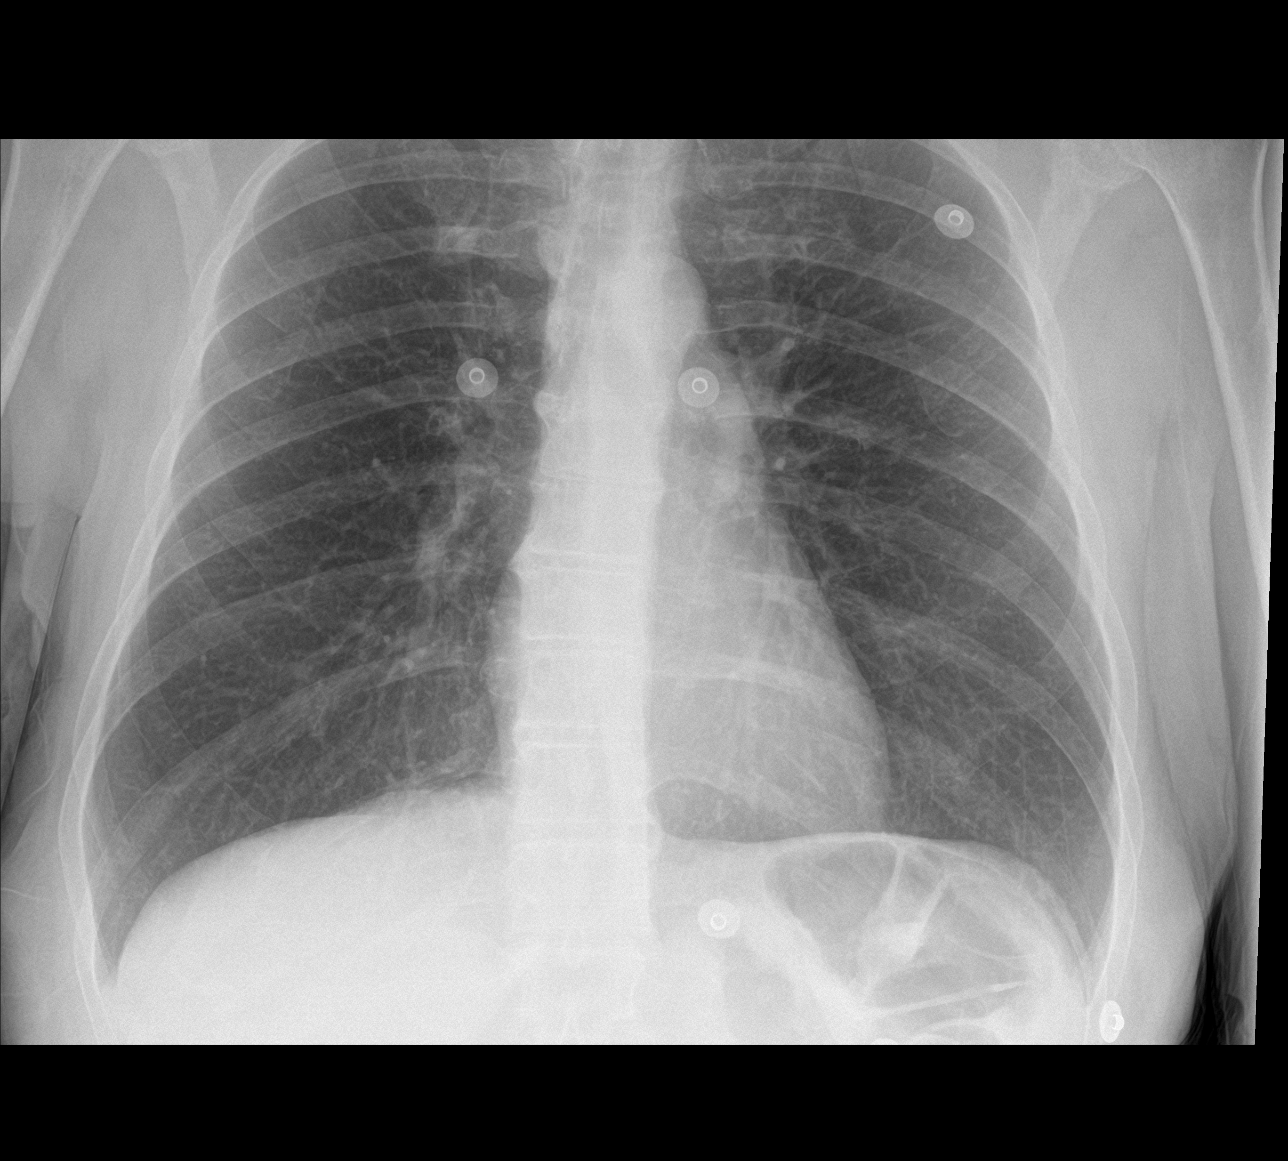

[abdomen erect]
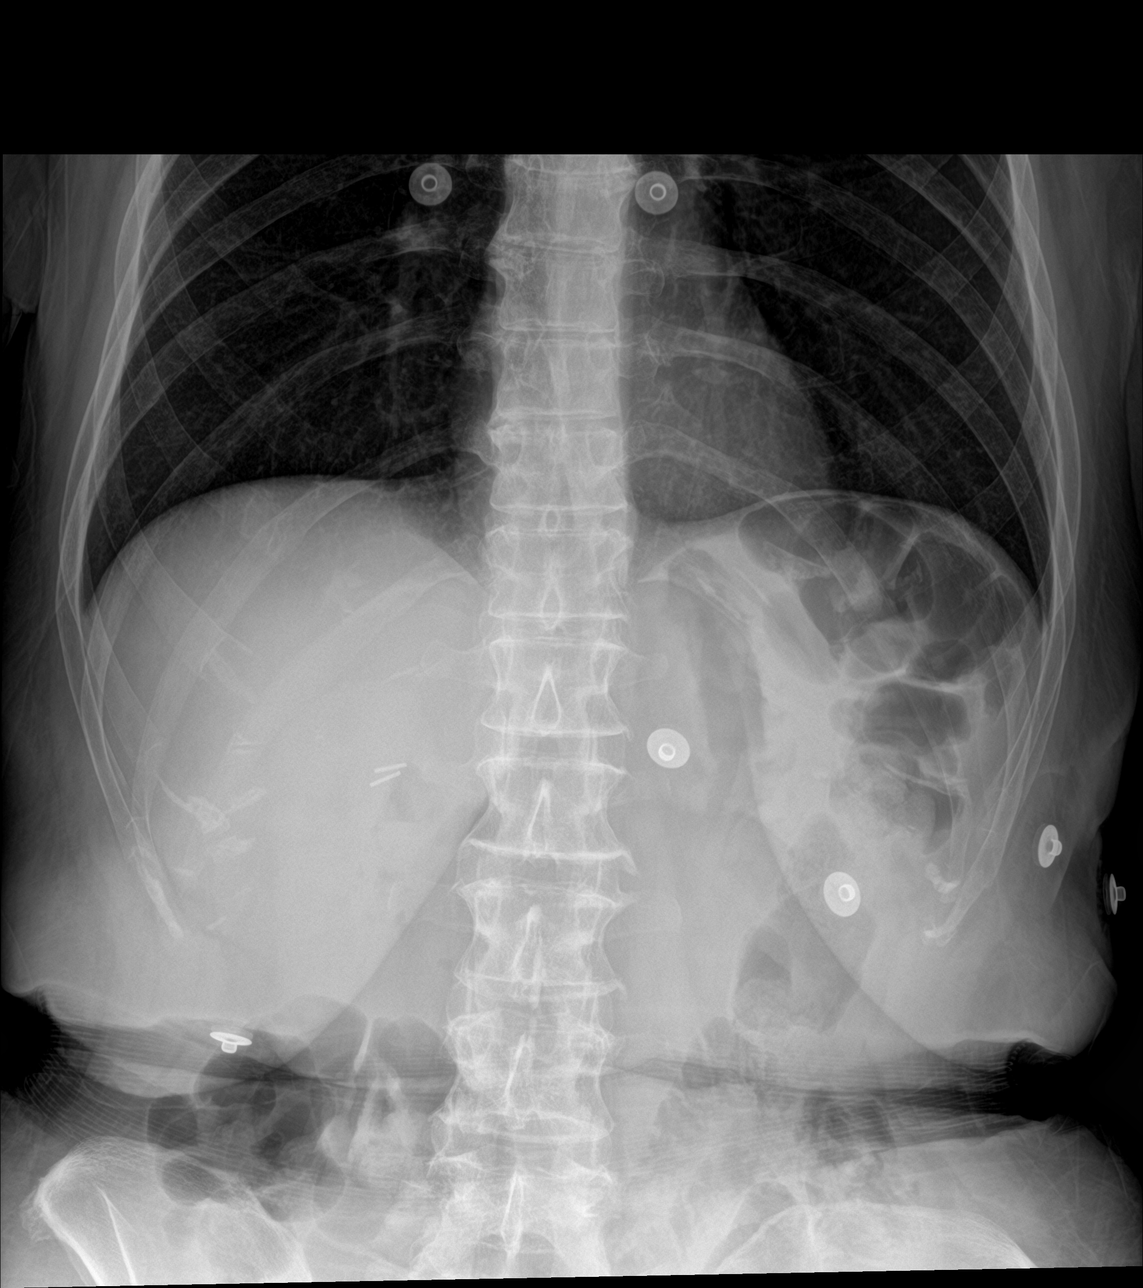

[abdomen supine]
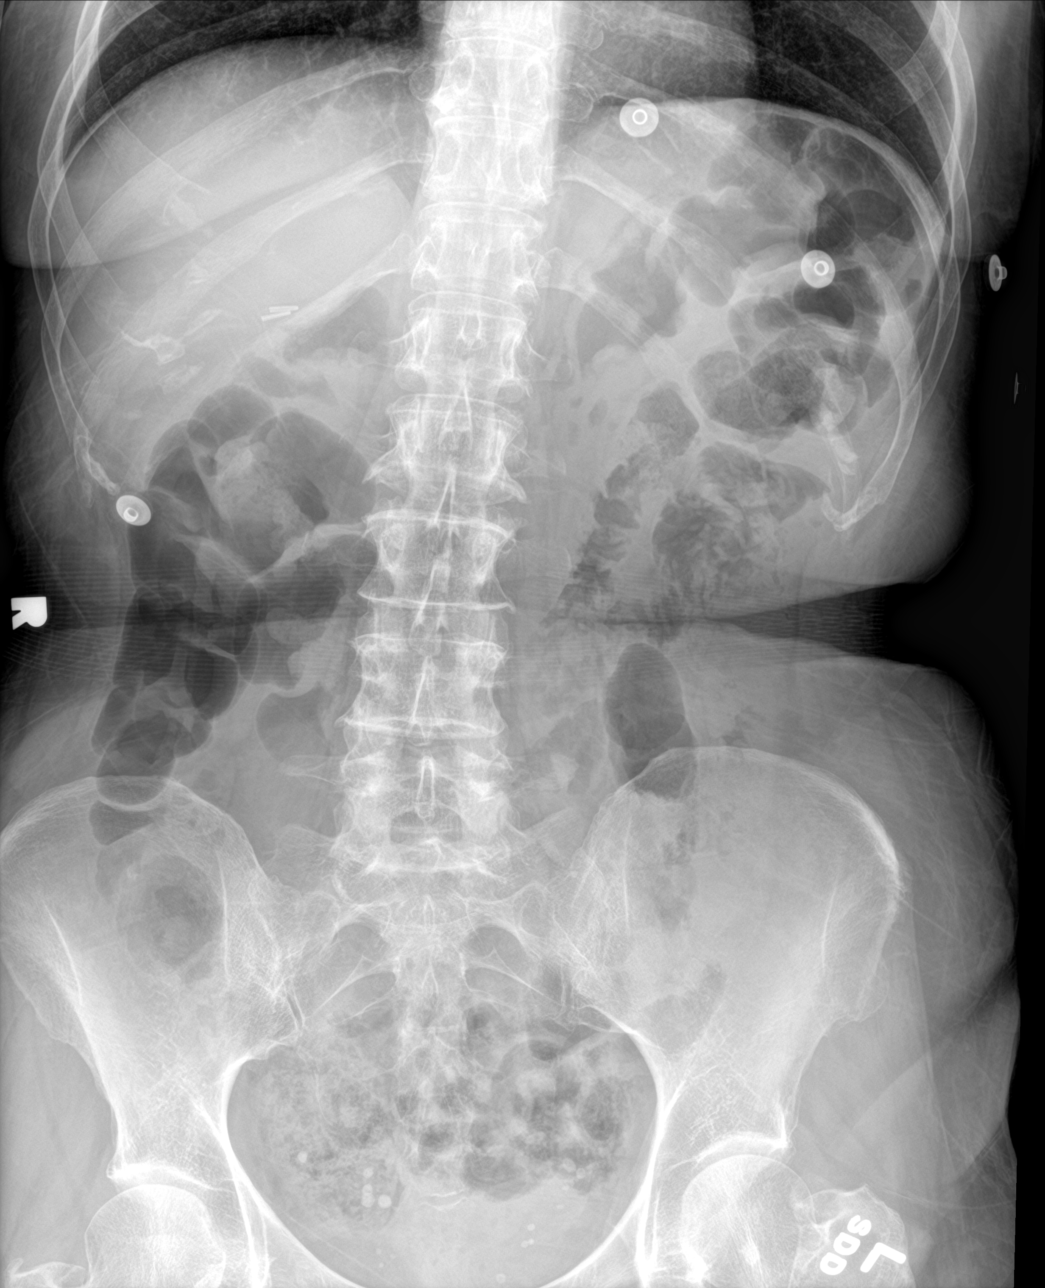

[3 of 3 positions shown; findings below may reference images not displayed]

FINDINGS: Single-view of the chest: Heart size and mediastinal contours are
within normal limits. Lungs are clear. No pleural effusion or
pneumothorax seen. Osseous structures about the chest are
unremarkable.

Upright and supine views of the abdomen: Bowel gas pattern is
nonobstructive. No evidence of soft tissue mass or abnormal fluid
collection. No evidence of free intraperitoneal air. No evidence of
renal or ureteral calculi. Stable vascular phleboliths within the
lower pelvis. Cholecystectomy clips in the RIGHT upper quadrant. No
acute or suspicious osseous finding.
IMPRESSION: 1. No active cardiopulmonary disease. No evidence of pneumonia or
pulmonary edema.
2. No acute findings within the abdomen or pelvis. Nonobstructive
bowel gas pattern.

## 2019-01-28 DIAGNOSIS — E039 Hypothyroidism, unspecified: Secondary | ICD-10-CM | POA: Diagnosis not present

## 2019-01-28 DIAGNOSIS — E1142 Type 2 diabetes mellitus with diabetic polyneuropathy: Secondary | ICD-10-CM | POA: Diagnosis not present

## 2019-01-28 DIAGNOSIS — F331 Major depressive disorder, recurrent, moderate: Secondary | ICD-10-CM | POA: Diagnosis not present

## 2019-01-28 DIAGNOSIS — J449 Chronic obstructive pulmonary disease, unspecified: Secondary | ICD-10-CM | POA: Diagnosis not present

## 2019-02-11 DIAGNOSIS — N390 Urinary tract infection, site not specified: Secondary | ICD-10-CM | POA: Diagnosis not present

## 2019-02-11 DIAGNOSIS — E1142 Type 2 diabetes mellitus with diabetic polyneuropathy: Secondary | ICD-10-CM | POA: Diagnosis not present

## 2019-02-22 NOTE — Congregational Nurse Program (Signed)
No complaints or concerns. Reviewed importance of following  CDC guidelines for, handwashing social distancing , and wearing a mask Blood Sugar_ 323 before dinner. Stated she is an Insulin Dependent diabetic and on Lantus at HS. Also stated she had just ate a bag of chips and drank a soda. Discussed diabetic diet,and exercise  Mandy Heritage RN, [facility], [phone]

## 2019-03-01 NOTE — Congregational Nurse Program (Signed)
Stated she felt fine today but her blood sugar dropped to 53 earlier. 138/72Reviewed theimportance of diet, taking meds on time and exercise.voiced understanding. Also discussed s/s of CO_VID 19 and importance of getting tested. Temp 98.7 B P 138/72  P [address] RN, [place] [facility], [phone]

## 2019-03-08 NOTE — Congregational Nurse Program (Signed)
Stated she was doing fine and her blood sugar was 124. No complaints of pain or discomfort BP 130/70 P80 Reviewed signs and symptoms of  COVID [address], [place] [name], [phone]

## 2019-03-10 ENCOUNTER — Other Ambulatory Visit: Payer: Self-pay | Admitting: *Deleted

## 2019-03-10 DIAGNOSIS — Z20822 Contact with and (suspected) exposure to covid-19: Secondary | ICD-10-CM

## 2019-03-14 LAB — NOVEL CORONAVIRUS, NAA: SARS-CoV-2, NAA: NOT DETECTED

## 2019-03-14 LAB — SPECIMEN STATUS REPORT

## 2019-03-15 NOTE — Congregational Nurse Program (Signed)
Stated she is doing well today.Blood sugar down today 145. Reviewed S/S of COVID 19 and the prevention. Voiced understanding  B P 125/76 P [phone] RN, [place] [facility], [phone]

## 2019-06-01 ENCOUNTER — Ambulatory Visit (HOSPITAL_COMMUNITY)
Admission: RE | Admit: 2019-06-01 | Discharge: 2019-06-01 | Disposition: A | Discharge status: Home / Self Care | Payer: Medicare HMO | Source: Ambulatory Visit | Attending: Family Medicine | Admitting: Family Medicine

## 2019-06-01 ENCOUNTER — Other Ambulatory Visit: Payer: Self-pay

## 2019-06-01 ENCOUNTER — Other Ambulatory Visit (HOSPITAL_COMMUNITY): Payer: Self-pay | Admitting: Family Medicine

## 2019-06-01 DIAGNOSIS — M25561 Pain in right knee: Secondary | ICD-10-CM

## 2019-06-01 DIAGNOSIS — M545 Low back pain, unspecified: Secondary | ICD-10-CM

## 2019-06-01 DIAGNOSIS — M25562 Pain in left knee: Secondary | ICD-10-CM

## 2019-06-07 ENCOUNTER — Other Ambulatory Visit: Payer: Self-pay

## 2019-06-07 ENCOUNTER — Emergency Department (HOSPITAL_COMMUNITY)
Admission: EM | Admit: 2019-06-07 | Discharge: 2019-06-08 | Disposition: A | Discharge status: Home / Self Care | Payer: Medicare HMO | Attending: Emergency Medicine | Admitting: Emergency Medicine

## 2019-06-07 ENCOUNTER — Encounter (HOSPITAL_COMMUNITY): Payer: Self-pay

## 2019-06-07 DIAGNOSIS — E039 Hypothyroidism, unspecified: Secondary | ICD-10-CM | POA: Insufficient documentation

## 2019-06-07 DIAGNOSIS — Z794 Long term (current) use of insulin: Secondary | ICD-10-CM | POA: Insufficient documentation

## 2019-06-07 DIAGNOSIS — J449 Chronic obstructive pulmonary disease, unspecified: Secondary | ICD-10-CM | POA: Diagnosis not present

## 2019-06-07 DIAGNOSIS — F17210 Nicotine dependence, cigarettes, uncomplicated: Secondary | ICD-10-CM | POA: Diagnosis not present

## 2019-06-07 DIAGNOSIS — R4020 Unspecified coma: Secondary | ICD-10-CM

## 2019-06-07 DIAGNOSIS — R55 Syncope and collapse: Secondary | ICD-10-CM | POA: Diagnosis not present

## 2019-06-07 DIAGNOSIS — E119 Type 2 diabetes mellitus without complications: Secondary | ICD-10-CM | POA: Diagnosis not present

## 2019-06-07 DIAGNOSIS — F1910 Other psychoactive substance abuse, uncomplicated: Secondary | ICD-10-CM

## 2019-06-07 DIAGNOSIS — Z79899 Other long term (current) drug therapy: Secondary | ICD-10-CM | POA: Insufficient documentation

## 2019-06-07 DIAGNOSIS — F1510 Other stimulant abuse, uncomplicated: Secondary | ICD-10-CM | POA: Diagnosis not present

## 2019-06-07 DIAGNOSIS — F1410 Cocaine abuse, uncomplicated: Secondary | ICD-10-CM | POA: Insufficient documentation

## 2019-06-07 DIAGNOSIS — T50901A Poisoning by unspecified drugs, medicaments and biological substances, accidental (unintentional), initial encounter: Secondary | ICD-10-CM | POA: Diagnosis present

## 2019-06-07 LAB — COMPREHENSIVE METABOLIC PANEL
ALT: 27 U/L (ref 0–44)
AST: 53 U/L — ABNORMAL HIGH (ref 15–41)
Albumin: 3.8 g/dL (ref 3.5–5.0)
Alkaline Phosphatase: 84 U/L (ref 38–126)
Anion gap: 12 (ref 5–15)
BUN: 17 mg/dL (ref 6–20)
CO2: 24 mmol/L (ref 22–32)
Calcium: 8.9 mg/dL (ref 8.9–10.3)
Chloride: 100 mmol/L (ref 98–111)
Creatinine, Ser: 0.99 mg/dL (ref 0.44–1.00)
GFR calc Af Amer: >60 mL/min (ref >60)
GFR calc non Af Amer: >60 mL/min (ref >60)
Glucose, Bld: 279 mg/dL — ABNORMAL HIGH (ref 70–99)
Potassium: 4 mmol/L (ref 3.5–5.1)
Sodium: 136 mmol/L (ref 135–145)
Total Bilirubin: 0.5 mg/dL (ref 0.3–1.2)
Total Protein: 7.1 g/dL (ref 6.5–8.1)

## 2019-06-07 LAB — CBC WITH DIFFERENTIAL/PLATELET
Abs Immature Granulocytes: 0.04 10*3/uL (ref 0.00–0.07)
Basophils Absolute: 0.1 10*3/uL (ref 0.0–0.1)
Basophils Relative: 1 %
Eosinophils Absolute: 0.1 10*3/uL (ref 0.0–0.5)
Eosinophils Relative: 1 %
HCT: 41.6 % (ref 36.0–46.0)
Hemoglobin: 13.3 g/dL (ref 12.0–15.0)
Immature Granulocytes: 0 %
Lymphocytes Relative: 18 %
Lymphs Abs: 1.6 10*3/uL (ref 0.7–4.0)
MCH: 31.1 pg (ref 26.0–34.0)
MCHC: 32 g/dL (ref 30.0–36.0)
MCV: 97.2 fL (ref 80.0–100.0)
Monocytes Absolute: 0.6 10*3/uL (ref 0.1–1.0)
Monocytes Relative: 7 %
Neutro Abs: 6.9 10*3/uL (ref 1.7–7.7)
Neutrophils Relative %: 73 %
Platelets: 243 10*3/uL (ref 150–400)
RBC: 4.28 MIL/uL (ref 3.87–5.11)
RDW: 12.8 % (ref 11.5–15.5)
WBC: 9.4 10*3/uL (ref 4.0–10.5)
nRBC: 0 % (ref 0.0–0.2)

## 2019-06-07 LAB — RAPID URINE DRUG SCREEN, HOSP PERFORMED
Amphetamines: POSITIVE — AB
Barbiturates: NOT DETECTED
Benzodiazepines: NOT DETECTED
Cocaine: POSITIVE — AB
Opiates: NOT DETECTED
Tetrahydrocannabinol: NOT DETECTED

## 2019-06-07 LAB — TROPONIN I (HIGH SENSITIVITY)
Troponin I (High Sensitivity): 3 ng/L (ref <18)
Troponin I (High Sensitivity): 10 ng/L (ref <18)

## 2019-06-07 LAB — CBG MONITORING, ED: Glucose-Capillary: 286 mg/dL — ABNORMAL HIGH (ref 70–99)

## 2019-06-07 MED ORDER — SODIUM CHLORIDE 0.9 % IV BOLUS
1000.0000 mL | Freq: Once | INTRAVENOUS | Status: AC
  Administered 2019-06-07: 21:00:00 1000 mL via INTRAVENOUS

## 2019-06-07 NOTE — ED Provider Notes (Signed)
[facility] EMERGENCY DEPARTMENT Provider Note   CSN: [id number] Arrival date & time: 06/07/19  1935     History   Chief Complaint Chief Complaint  Patient presents with  . Drug Overdose    HPI ELLIANNA [name] is a 58 y.o. female.     Patient was found unresponsive.  The paramedics gave her Narcan twice and she awoke.  She has been alert since she arrived at the emergency department but does remember what happened.  Patient denies drug abuse  The history is provided by the patient. No language interpreter was used.  Drug Overdose This is a new problem. The problem occurs rarely. The problem has been resolved. Pertinent negatives include no chest pain, no abdominal pain and no headaches. Nothing aggravates the symptoms. She has tried nothing for the symptoms. The treatment provided no relief.    Past Medical History:  Diagnosis Date  . Anxiety   . Arthritis    rheumatoid   . BV (bacterial vaginosis) 05/23/2015  . Chronic abdominal pain   . COPD (chronic obstructive pulmonary disease) ([place])   . Diabetes mellitus    type I  . GERD (gastroesophageal reflux disease)   . Hiatal hernia   . Hyperlipidemia   . Hypertension   . Hypothyroid   . Polyneuropathy in diabetes ([place])   . PUD (peptic ulcer disease)    ? remote past, no reports from [place] or [facility], states possibly Dr. Laural Golden.   . Seizures ([place])    had in the past with hypoglycemia.  . Stroke ([place])    no deficits.  . Trichimoniasis   . Unspecified polyarthropathy or polyarthritis, site unspecified   . Vaginal discharge 05/23/2015    Patient Active Problem List   Diagnosis Date Noted  . AKI (acute kidney injury) ([place])   . Yeast infection   . Rhinitis   . Dehydration   . Insulin-requiring or dependent type II diabetes mellitus ([place]) 03/06/2018  . Mild renal insufficiency 03/06/2018  . Hyponatremia 03/06/2018  . Chronic cholecystitis   . Depression 11/08/2015  . Hypothyroidism 11/08/2015  . History of  colonic polyps   . Constipation 05/16/2015  . DKA (diabetic ketoacidoses) ([place]) 02/20/2014  . Nausea vomiting and diarrhea 02/20/2014  . Cocaine dependence ([place]) 03/03/2012  . GERD (gastroesophageal reflux disease) 01/29/2012  . MIXED HYPERLIPIDEMIA 03/26/2010  . SMOKER 03/26/2010    Past Surgical History:  Procedure Laterality Date  . ABDOMINAL HYSTERECTOMY    . APPENDECTOMY    . BLADDER REPAIR    . CHOLECYSTECTOMY N/A 06/19/2017   Procedure: LAPAROSCOPIC CHOLECYSTECTOMY;  Surgeon: Aviva Signs, MD;  Location: [facility];  Service: General;  Laterality: N/A;  . COLONOSCOPY N/A 06/01/2015   [id number] hemorrhoid diminutive rectosigmoid otherwise normal  . ESOPHAGOGASTRODUODENOSCOPY     in remote past, unclear who performed. No op notes from [facility] or [facility] (pt thought Dr. Laural Golden)  . ESOPHAGOGASTRODUODENOSCOPY  02/12/2012   Dr. Gala Romney:  Normal esophagus status post 63 F dilation. Small hiatal hernia. mild chronic gastritis  . ESOPHAGOGASTRODUODENOSCOPY N/A 02/21/2014   [id number] exudative reflux esophagitis-and + Candida esophagitis.  Marland Kitchen ESOPHAGOGASTRODUODENOSCOPY N/A 06/01/2015   [id number] HH otherwise normal  . TONSILLECTOMY       OB History    Gravida  1   Para  1   Term  1   Preterm      AB      Living  1     SAB      TAB  Ectopic      Multiple      Live Births  1            Home Medications    Prior to Admission medications   Medication Sig Start Date End Date Taking? Authorizing Provider  albuterol (PROVENTIL HFA;VENTOLIN HFA) 108 (90 Base) MCG/ACT inhaler Inhale 2 puffs into the lungs every 6 (six) hours as needed for wheezing or shortness of breath. 12/18/16  Yes Rancour, Annie Main, MD  albuterol (PROVENTIL) (2.5 MG/3ML) 0.083% nebulizer solution Take 2.5 mg by nebulization every 6 (six) hours as needed for wheezing or shortness of breath.   Yes [provider]  gabapentin (NEURONTIN) 100 MG capsule Take 200 mg by mouth 4 (four) times daily.  01/21/18  Yes [provider]  Insulin Glargine (LANTUS SOLOSTAR) 100 UNIT/ML Solostar Pen Inject 22 Units into the skin at bedtime. Patient taking differently: Inject 40 Units into the skin at bedtime.  03/08/18  Yes Barton Dubois, MD  insulin lispro (HUMALOG) 100 UNIT/ML injection Inject 5-10 Units into the skin 3 (three) times daily as needed for high blood sugar (Uses 3 timesdaily according to sliding scale).   Yes [provider]  levothyroxine (SYNTHROID, LEVOTHROID) 150 MCG tablet Take 150 mcg by mouth daily before breakfast.   Yes [provider]  loratadine (CLARITIN) 10 MG tablet Take 1 tablet (10 mg total) by mouth daily as needed for allergies or rhinitis. 03/08/18 06/07/19 Yes Barton Dubois, MD  omeprazole (PRILOSEC) 40 MG capsule Take 40 mg by mouth daily.   Yes [provider]  ondansetron (ZOFRAN ODT) 4 MG disintegrating tablet Take 1 tablet (4 mg total) by mouth every 6 (six) hours as needed for nausea. 08/12/18  Yes Cresenzo-Dishmon, Joaquim Lai, CNM  traZODone (DESYREL) 100 MG tablet Take 100 mg by mouth at bedtime.    Yes [provider]  fluticasone (FLONASE) 50 MCG/ACT nasal spray Place 2 sprays into both nostrils daily.     [provider]    Family History Family History  Problem Relation Age of Onset  . Liver cancer Mother        remission for 2 years  . Cancer Mother        lung  . Dementia Father   . Heart attack Father   . Other Sister        liver trouble  . Diabetes Brother   . Hyperthyroidism Brother   . Dementia Maternal Grandmother   . Diabetes Paternal Grandmother   . Diabetes Paternal Grandfather   . Colon cancer Neg Hx     Social History Social History   Tobacco Use  . Smoking status: Current Every Day Smoker    Packs/day: 0.25    Years: 34.00    Pack years: 8.50    Types: Cigarettes  . Smokeless tobacco: Never Used  . Tobacco comment: smokes 2 cig daily  Substance Use Topics  . Alcohol use:  No    Alcohol/week: 0.0 standard drinks  . Drug use: No     Allergies   Phenytoin, Sulfonamide derivatives, and Tetracyclines & related   Review of Systems Review of Systems  Constitutional: Negative for appetite change and fatigue.  HENT: Negative for congestion, ear discharge and sinus pressure.   Eyes: Negative for discharge.  Respiratory: Negative for cough.   Cardiovascular: Negative for chest pain.  Gastrointestinal: Negative for abdominal pain and diarrhea.  Genitourinary: Negative for frequency and hematuria.  Musculoskeletal: Negative for back pain.  Skin: Negative  for rash.  Neurological: Negative for seizures and headaches.  Psychiatric/Behavioral: Negative for hallucinations.     Physical Exam Updated Vital Signs BP 131/71   Pulse 75   Resp 12   Ht 5\' 4"  (1.626 m)   Wt 70.3 kg   SpO2 98%   BMI 26.60 kg/m   Physical Exam Vitals signs and nursing note reviewed.  Constitutional:      Appearance: She is well-developed.  HENT:     Head: Normocephalic.     Nose: Nose normal.  Eyes:     General: No scleral icterus.    Conjunctiva/sclera: Conjunctivae normal.  Neck:     Musculoskeletal: Neck supple.     Thyroid: No thyromegaly.  Cardiovascular:     Rate and Rhythm: Normal rate and regular rhythm.     Heart sounds: No murmur. No friction rub. No gallop.   Pulmonary:     Breath sounds: No stridor. No wheezing or rales.  Chest:     Chest wall: No tenderness.  Abdominal:     General: There is no distension.     Tenderness: There is no abdominal tenderness. There is no rebound.  Musculoskeletal: Normal range of motion.  Lymphadenopathy:     Cervical: No cervical adenopathy.  Skin:    Findings: No erythema or rash.  Neurological:     Mental Status: She is oriented to person, place, and time.     Motor: No abnormal muscle tone.     Coordination: Coordination normal.  Psychiatric:        Behavior: Behavior normal.      ED Treatments / Results   Labs (all labs ordered are listed, but only abnormal results are displayed) Labs Reviewed  COMPREHENSIVE METABOLIC PANEL - Abnormal; Notable for the following components:      Result Value   Glucose, Bld 279 (*)    AST 53 (*)    All other components within normal limits  CBG MONITORING, ED - Abnormal; Notable for the following components:   Glucose-Capillary 286 (*)    All other components within normal limits  CBC WITH DIFFERENTIAL/PLATELET  RAPID URINE DRUG SCREEN, HOSP PERFORMED  TROPONIN I (HIGH SENSITIVITY)  TROPONIN I (HIGH SENSITIVITY)    EKG None  Radiology No results found.  Procedures Procedures (including critical care time)  Medications Ordered in ED Medications  sodium chloride 0.9 % bolus 1,000 mL (1,000 mLs Intravenous New Bag/Given 06/07/19 2049)     Initial Impression / Assessment and Plan / ED Course  I have reviewed the triage vital signs and the nursing notes.  Pertinent labs & imaging results that were available during my care of the patient were reviewed by me and considered in my medical decision making (see chart for details).      Labs unremarkable except for mildly elevated glucose.  Drug screen and second troponin pending  Final Clinical Impressions(s) / ED Diagnoses   Final diagnoses:  None    ED Discharge Orders    None       Milton Ferguson, MD 06/07/19 2247

## 2019-06-07 NOTE — ED Triage Notes (Signed)
Per EMS: they found pt on floor of an [address] from hospital  She was unresponsive Gave her two sprays of narcan   She awakened and they transported to ED  Per PT "I don't even know where I was" Pt denies any type of med ingestion, illegal substance  Repeats, "I'm a diabetic" She is moved from hall to rm 9

## 2019-06-08 ENCOUNTER — Emergency Department (HOSPITAL_COMMUNITY): Payer: Medicare HMO

## 2019-06-08 DIAGNOSIS — R55 Syncope and collapse: Secondary | ICD-10-CM | POA: Diagnosis not present

## 2019-06-08 LAB — TROPONIN I (HIGH SENSITIVITY)
Troponin I (High Sensitivity): 11 ng/L (ref <18)
Troponin I (High Sensitivity): 9 ng/L (ref <18)

## 2019-06-08 LAB — D-DIMER, QUANTITATIVE: D-Dimer, Quant: 0.47 ug/mL-FEU (ref 0.00–0.50)

## 2019-06-08 LAB — BASIC METABOLIC PANEL
Anion gap: 8 (ref 5–15)
BUN: 16 mg/dL (ref 6–20)
CO2: 24 mmol/L (ref 22–32)
Calcium: 8.8 mg/dL — ABNORMAL LOW (ref 8.9–10.3)
Chloride: 102 mmol/L (ref 98–111)
Creatinine, Ser: 0.8 mg/dL (ref 0.44–1.00)
GFR calc Af Amer: >60 mL/min (ref >60)
GFR calc non Af Amer: >60 mL/min (ref >60)
Glucose, Bld: 360 mg/dL — ABNORMAL HIGH (ref 70–99)
Potassium: 5.3 mmol/L — ABNORMAL HIGH (ref 3.5–5.1)
Sodium: 134 mmol/L — ABNORMAL LOW (ref 135–145)

## 2019-06-08 LAB — ACETAMINOPHEN LEVEL: Acetaminophen (Tylenol), Serum: <10 ug/mL — ABNORMAL LOW (ref 10–30)

## 2019-06-08 LAB — CBG MONITORING, ED: Glucose-Capillary: 355 mg/dL — ABNORMAL HIGH (ref 70–99)

## 2019-06-08 LAB — SALICYLATE LEVEL: Salicylate Lvl: <7 mg/dL (ref 2.8–30.0)

## 2019-06-08 LAB — POTASSIUM: Potassium: 4.8 mmol/L (ref 3.5–5.1)

## 2019-06-08 NOTE — ED Notes (Signed)
Pt ambulated to restroom with no difficulty at this time.

## 2019-06-08 NOTE — ED Provider Notes (Signed)
Care assumed from Dr. Roderic Palau.  Patient presents after episode of unresponsiveness which improved with Narcan.  She has since been awake and alert and does not know what happened.  Denies any alcohol or drug abuse.  She is awaiting second troponin.  Her EKG is nonischemic.  Her drug screen is positive for cocaine and amphetamines.  Patient is awake and alert.  She does not know what happened.  She adamantly denies taking any illicit drugs or substances. Troponin negative x2.  D-dimer negative.  EKG shows no Brugada or prolonged QT. CT head is negative.  Patient tolerating p.o. and ambulatory. Patient states she is not suicidal or homicidal.  Repeat EKG is unchanged.  Repeat potassium level is normal.  Troponins remain negative.  She does complain of some pain underneath her right breast and ribs.  Subsequent chest x-ray shows no pneumothorax or obvious rib fracture.  Patient states she is not suicidal or homicidal.  Her drug screen was discussed with her and she does admit to using cocaine "2 or 3 days ago".  Adamantly denies any other illicit substance uses..   She has remained stable throughout ED period of observation without need for additional narcan. She is tolerating PO and ambulatory. She is advised to refrain from using all illicit substances. She will be able to be discharged when she has a sober ride.  BP (!) 113/59   Pulse 68   Resp 13   Ht 5\' 4"  (1.626 m)   Wt 70.3 kg   SpO2 91%   BMI 26.60 kg/m    .  EKG Interpretation  Date/Time:  Wednesday June 08 2019 05:37:03 EDT Ventricular Rate:  76 PR Interval:    QRS Duration: 78 QT Interval:  411 QTC Calculation: 463 R Axis:   58 Text Interpretation:  Sinus rhythm Probable anteroseptal infarct, old No significant change was found Confirmed by Ezequiel Essex [phone]) on 06/08/2019 5:46:00 AM         Ezequiel Essex, MD 06/08/19 [phone]

## 2019-06-08 NOTE — Discharge Instructions (Signed)
Stop using drugs. Followup with your primary doctor. Return to the ED if you develop new or worsening symptoms.

## 2020-01-27 ENCOUNTER — Telehealth: Payer: Self-pay | Admitting: Adult Health

## 2020-01-27 NOTE — Telephone Encounter (Signed)
Tried to reach the patient to remind her of her appointment/restrictions, but the call could not be completed at this time.

## 2020-01-30 ENCOUNTER — Ambulatory Visit: Payer: Medicare HMO | Admitting: Adult Health

## 2020-02-06 ENCOUNTER — Telehealth: Payer: Self-pay | Admitting: Adult Health

## 2020-02-06 NOTE — Telephone Encounter (Signed)
Unable to contact patient to review health screening questions and covid restrctions.

## 2020-02-07 ENCOUNTER — Ambulatory Visit: Payer: Medicare HMO | Admitting: Adult Health

## 2020-02-08 ENCOUNTER — Encounter: Payer: Self-pay | Admitting: Adult Health

## 2020-02-08 ENCOUNTER — Ambulatory Visit (INDEPENDENT_AMBULATORY_CARE_PROVIDER_SITE_OTHER): Payer: Medicare HMO | Admitting: Adult Health

## 2020-02-08 VITALS — BP 144/77 | HR 77 | Ht 64.0 in | Wt 165.0 lb

## 2020-02-08 DIAGNOSIS — R319 Hematuria, unspecified: Secondary | ICD-10-CM

## 2020-02-08 DIAGNOSIS — B9689 Other specified bacterial agents as the cause of diseases classified elsewhere: Secondary | ICD-10-CM | POA: Diagnosis not present

## 2020-02-08 DIAGNOSIS — N760 Acute vaginitis: Secondary | ICD-10-CM | POA: Diagnosis not present

## 2020-02-08 DIAGNOSIS — R8290 Unspecified abnormal findings in urine: Secondary | ICD-10-CM | POA: Diagnosis not present

## 2020-02-08 DIAGNOSIS — N390 Urinary tract infection, site not specified: Secondary | ICD-10-CM

## 2020-02-08 DIAGNOSIS — N898 Other specified noninflammatory disorders of vagina: Secondary | ICD-10-CM | POA: Insufficient documentation

## 2020-02-08 LAB — POCT URINALYSIS DIPSTICK
Glucose, UA: POSITIVE — AB
Ketones, UA: NEGATIVE
Nitrite, UA: POSITIVE
Protein, UA: NEGATIVE

## 2020-02-08 LAB — POCT WET PREP (WET MOUNT)
Clue Cells Wet Prep Whiff POC: POSITIVE
Trichomonas Wet Prep HPF POC: ABSENT
WBC, Wet Prep HPF POC: POSITIVE

## 2020-02-08 MED ORDER — NITROFURANTOIN MONOHYD MACRO 100 MG PO CAPS: 100.0000 mg | ORAL_CAPSULE | Freq: Two times a day (BID) | ORAL | 0 refills | Status: DC

## 2020-02-08 MED ORDER — METRONIDAZOLE 500 MG PO TABS: 500.0000 mg | ORAL_TABLET | Freq: Two times a day (BID) | ORAL | 0 refills | Status: DC

## 2020-02-08 NOTE — Progress Notes (Signed)
  Subjective:     [name], female   DOB: Feb 27, 1961, 59 y.o.   MRN: [id number]  HPI [name] is a 59 year old white female, divorced, sp hysterectomy in complaining of vaginal discharge and odor and urine has odor. Has history of bladder problems since baby she says PCP is Dr Legrand Rams.   Review of Systems Has vaginal discharge and odor Urine has odor Has 1 sex partner Reviewed past medical,surgical, social and family history. Reviewed medications and allergies.     Objective:   Physical Exam BP (!) 144/77 (BP Location: Left Arm, Patient Position: Sitting, Cuff Size: Normal)   Pulse 77   Ht 5\' 4"  (1.626 m)   Wt 165 lb (74.8 kg)   BMI 28.32 kg/m    urine +nitrates, blood,leuks and glucose Skin warm and dry.Pelvic: external genitalia is normal in appearance no lesions, vagina: creamy greenish  discharge with odor,side walls are red,urethra has no lesions or masses noted, cervix and uterus are absent, Bladder is non tender and no masses felt.Wet prep: +clue cells and +WBCs Nuswab obtained. Examination chaperoned by Levy Pupa LPN Fall risk is high.  Assessment:     1. Abnormal urine odor Push water  2. Urinary tract infection with hematuria, site unspecified UA C&S sent Will rx macrobid 1 bid x 7 days   3. Vaginal discharge Nuswab sent  4. Vaginal odor Nuswab sent   5. BV (bacterial vaginosis) Rx flagyl 500 mg 1 bid x 7 days, no alcohol, review handout on BV        Plan:     Meds ordered this encounter  Medications  . nitrofurantoin, macrocrystal-monohydrate, (MACROBID) 100 MG capsule    Sig: Take 1 capsule (100 mg total) by mouth 2 (two) times daily.    Dispense:  14 capsule    Refill:  0    Order Specific Question:   Supervising Provider    Answer:   Elonda Husky, LUTHER H [[id number]]  . metroNIDAZOLE (FLAGYL) 500 MG tablet    Sig: Take 1 tablet (500 mg total) by mouth 2 (two) times daily.    Dispense:  14 tablet    Refill:  0    Order Specific  Question:   Supervising Provider    Answer:   Tania Ade H [[id number]]   Follow up prn

## 2020-02-08 NOTE — Patient Instructions (Signed)
Bacterial Vaginosis  Bacterial vaginosis is a vaginal infection that occurs when the normal balance of bacteria in the vagina is disrupted. It results from an overgrowth of certain bacteria. This is the most common vaginal infection among women ages 15-44. Because bacterial vaginosis increases your risk for STIs (sexually transmitted infections), getting treated can help reduce your risk for chlamydia, gonorrhea, herpes, and HIV (human immunodeficiency virus). Treatment is also important for preventing complications in pregnant women, because this condition can cause an early (premature) delivery. What are the causes? This condition is caused by an increase in harmful bacteria that are normally present in small amounts in the vagina. However, the reason that the condition develops is not fully understood. What increases the risk? The following factors may make you more likely to develop this condition:  Having a new sexual partner or multiple sexual partners.  Having unprotected sex.  Douching.  Having an intrauterine device (IUD).  Smoking.  Drug and alcohol abuse.  Taking certain antibiotic medicines.  Being pregnant. You cannot get bacterial vaginosis from toilet seats, bedding, swimming pools, or contact with objects around you. What are the signs or symptoms? Symptoms of this condition include:  Grey or white vaginal discharge. The discharge can also be watery or foamy.  A fish-like odor with discharge, especially after sexual intercourse or during menstruation.  Itching in and around the vagina.  Burning or pain with urination. Some women with bacterial vaginosis have no signs or symptoms. How is this diagnosed? This condition is diagnosed based on:  Your medical history.  A physical exam of the vagina.  Testing a sample of vaginal fluid under a microscope to look for a large amount of bad bacteria or abnormal cells. Your health care provider may use a cotton swab or  a small wooden spatula to collect the sample. How is this treated? This condition is treated with antibiotics. These may be given as a pill, a vaginal cream, or a medicine that is put into the vagina (suppository). If the condition comes back after treatment, a second round of antibiotics may be needed. Follow these instructions at home: Medicines  Take over-the-counter and prescription medicines only as told by your health care provider.  Take or use your antibiotic as told by your health care provider. Do not stop taking or using the antibiotic even if you start to feel better. General instructions  If you have a female sexual partner, tell her that you have a vaginal infection. She should see her health care provider and be treated if she has symptoms. If you have a female sexual partner, he does not need treatment.  During treatment: ? Avoid sexual activity until you finish treatment. ? Do not douche. ? Avoid alcohol as directed by your health care provider. ? Avoid breastfeeding as directed by your health care provider.  Drink enough water and fluids to keep your urine clear or pale yellow.  Keep the area around your vagina and rectum clean. ? Wash the area daily with warm water. ? Wipe yourself from front to back after using the toilet.  Keep all follow-up visits as told by your health care provider. This is important. How is this prevented?  Do not douche.  Wash the outside of your vagina with warm water only.  Use protection when having sex. This includes latex condoms and dental dams.  Limit how many sexual partners you have. To help prevent bacterial vaginosis, it is best to have sex with just one partner (  monogamous).  Make sure you and your sexual partner are tested for STIs.  Wear cotton or cotton-lined underwear.  Avoid wearing tight pants and pantyhose, especially during summer.  Limit the amount of alcohol that you drink.  Do not use any products that contain  nicotine or tobacco, such as cigarettes and e-cigarettes. If you need help quitting, ask your health care provider.  Do not use illegal drugs. Where to find more information  Centers for Disease Control and Prevention: www.cdc.gov/std  American Sexual Health Association (ASHA): www.ashastd.org  [place] Department of Health and Human Services, Office on Women's Health: www.womenshealth.gov/ or https://www.womenshealth.gov/a-z-topics/bacterial-vaginosis Contact a health care provider if:  Your symptoms do not improve, even after treatment.  You have more discharge or pain when urinating.  You have a fever.  You have pain in your abdomen.  You have pain during sex.  You have vaginal bleeding between periods. Summary  Bacterial vaginosis is a vaginal infection that occurs when the normal balance of bacteria in the vagina is disrupted.  Because bacterial vaginosis increases your risk for STIs (sexually transmitted infections), getting treated can help reduce your risk for chlamydia, gonorrhea, herpes, and HIV (human immunodeficiency virus). Treatment is also important for preventing complications in pregnant women, because the condition can cause an early (premature) delivery.  This condition is treated with antibiotic medicines. These may be given as a pill, a vaginal cream, or a medicine that is put into the vagina (suppository). This information is not intended to replace advice given to you by your health care provider. Make sure you discuss any questions you have with your health care provider. Document Revised: 08/21/2017 Document Reviewed: 05/24/2016 [employer] Patient Education  2020 [employer]  

## 2020-02-09 LAB — MICROSCOPIC EXAMINATION
Casts: NONE SEEN /lpf
WBC, UA: >30 /hpf — AB (ref 0–5)

## 2020-02-09 LAB — URINALYSIS, ROUTINE W REFLEX MICROSCOPIC
Bilirubin, UA: NEGATIVE
Ketones, UA: NEGATIVE
Nitrite, UA: POSITIVE — AB
Protein,UA: NEGATIVE
Specific Gravity, UA: 1.019 (ref 1.005–1.030)
Urobilinogen, Ur: 0.2 mg/dL (ref 0.2–1.0)
pH, UA: 6 (ref 5.0–7.5)

## 2020-02-10 ENCOUNTER — Telehealth: Payer: Self-pay | Admitting: Adult Health

## 2020-02-10 LAB — URINE CULTURE

## 2020-02-10 MED ORDER — PROMETHAZINE HCL 25 MG PO TABS
25.0000 mg | ORAL_TABLET | Freq: Four times a day (QID) | ORAL | 1 refills | Status: AC | PRN
Start: 2020-02-10 — End: ?

## 2020-02-10 NOTE — Telephone Encounter (Signed)
Will rx phenergan for nausea but try to keep taking the macrobid

## 2020-02-10 NOTE — Telephone Encounter (Signed)
Pt feels like the Macrobid is making her sick. She has no appetite and feels tired. She is a diabetic and can't not eat. She wanted to see if there is anything else Anderson Malta can do. Advised I would send message to [facility] and let her know.

## 2020-02-10 NOTE — Telephone Encounter (Signed)
Please call pt in ref to her meds that Anderson Malta gave her . They are making her sick

## 2020-02-10 NOTE — Telephone Encounter (Signed)
Mail box is full, if she calls try taking with food or milk, urine culture shows E coli, but do not have sensitivities yet

## 2020-02-13 ENCOUNTER — Other Ambulatory Visit: Payer: Self-pay | Admitting: Adult Health

## 2020-02-13 ENCOUNTER — Telehealth: Payer: Self-pay | Admitting: Adult Health

## 2020-02-13 LAB — NUSWAB VAGINITIS PLUS (VG+)
Candida albicans, NAA: NEGATIVE
Candida glabrata, NAA: NEGATIVE
Chlamydia trachomatis, NAA: NEGATIVE
Neisseria gonorrhoeae, NAA: NEGATIVE
Trich vag by NAA: POSITIVE — AB

## 2020-02-13 NOTE — Telephone Encounter (Signed)
Mailbox is full, if she calls let her know urine culture + ecoli, try to finish macrobid

## 2020-02-13 NOTE — Progress Notes (Unsigned)
flag

## 2020-02-15 ENCOUNTER — Telehealth: Payer: Self-pay | Admitting: Adult Health

## 2020-02-15 NOTE — Telephone Encounter (Signed)
Pt aware of +trich, the flagyl should take care of it, get PCO for +trich and on urine for + Ecoli next week and tell partner needs to be treated

## 2021-04-11 LAB — VITAMIN B12: Vitamin B-12: 413

## 2021-04-11 LAB — LIPID PANEL
Cholesterol: 259 — AB (ref 0–200)
HDL: 120 — AB (ref 35–70)
LDL Cholesterol: 122
Triglycerides: 101 (ref 40–160)

## 2021-04-11 LAB — VITAMIN D 25 HYDROXY (VIT D DEFICIENCY, FRACTURES): Vit D, 25-Hydroxy: 27.4

## 2021-04-11 LAB — HEMOGLOBIN A1C: Hemoglobin A1C: 9.4

## 2021-04-11 LAB — MICROALBUMIN, URINE: Microalb, Ur: <3

## 2021-06-12 ENCOUNTER — Other Ambulatory Visit (HOSPITAL_COMMUNITY): Payer: Self-pay | Admitting: Adult Health

## 2021-06-13 ENCOUNTER — Other Ambulatory Visit: Payer: Self-pay

## 2021-06-13 ENCOUNTER — Encounter: Payer: Self-pay | Admitting: "Endocrinology

## 2021-06-13 ENCOUNTER — Ambulatory Visit (INDEPENDENT_AMBULATORY_CARE_PROVIDER_SITE_OTHER): Payer: 59 | Admitting: "Endocrinology

## 2021-06-13 VITALS — BP 124/72 | HR 60 | Ht 64.0 in | Wt 173.2 lb

## 2021-06-13 DIAGNOSIS — E1065 Type 1 diabetes mellitus with hyperglycemia: Secondary | ICD-10-CM | POA: Diagnosis not present

## 2021-06-13 DIAGNOSIS — E782 Mixed hyperlipidemia: Secondary | ICD-10-CM | POA: Diagnosis not present

## 2021-06-13 DIAGNOSIS — E039 Hypothyroidism, unspecified: Secondary | ICD-10-CM | POA: Diagnosis not present

## 2021-06-13 MED ORDER — LANTUS SOLOSTAR 100 UNIT/ML ~~LOC~~ SOPN: 34.0000 [IU] | PEN_INJECTOR | Freq: Every day | SUBCUTANEOUS | 1 refills | Status: DC

## 2021-06-13 NOTE — Progress Notes (Signed)
Endocrinology Consult Note       06/13/2021, 3:43 PM   Subjective:    [name], female    DOB: 1963/07/22.  [name] is being seen in consultation for management of currently uncontrolled symptomatic diabetes requested by  Rosita Fire, MD.   Past Medical History:  Diagnosis Date   Anxiety    Arthritis    rheumatoid    BV (bacterial vaginosis) 05/23/2015   Chronic abdominal pain    COPD (chronic obstructive pulmonary disease) ([place])    Diabetes mellitus    type I   GERD (gastroesophageal reflux disease)    Hiatal hernia    Hyperlipidemia    Hypertension    Hypothyroid    Polyneuropathy in diabetes ([place])    PUD (peptic ulcer disease)    ? remote past, no reports from [facility] or [facility], states possibly Dr. Laural Golden.    Seizures ([place])    had in the past with hypoglycemia.   Stroke [facility])    no deficits.   Trichimoniasis    Unspecified polyarthropathy or polyarthritis, site unspecified    Vaginal discharge 05/23/2015    Past Surgical History:  Procedure Laterality Date   ABDOMINAL HYSTERECTOMY     APPENDECTOMY     BLADDER REPAIR     CHOLECYSTECTOMY N/A 06/19/2017   Procedure: LAPAROSCOPIC CHOLECYSTECTOMY;  Surgeon: Aviva Signs, MD;  Location: [facility];  Service: General;  Laterality: N/A;   COLONOSCOPY N/A 06/01/2015   JSE:GBTDVVOH hemorrhoid diminutive rectosigmoid otherwise normal   ESOPHAGOGASTRODUODENOSCOPY     in remote past, unclear who performed. No op notes from [facility] or [facility] (pt thought Dr. Laural Golden)   ESOPHAGOGASTRODUODENOSCOPY  02/12/2012   Dr. Gala Romney:  Normal esophagus status post 17 F dilation. Small hiatal hernia. mild chronic gastritis   ESOPHAGOGASTRODUODENOSCOPY N/A 02/21/2014   YWV:PXTGGY exudative reflux esophagitis-and + Candida esophagitis.   ESOPHAGOGASTRODUODENOSCOPY N/A 06/01/2015   IRS:[facility] otherwise normal   TONSILLECTOMY      Social  History   Socioeconomic History   Marital status: Divorced    Spouse name: Not on file   Number of children: Not on file   Years of education: Not on file   Highest education level: Not on file  Occupational History   Not on file  Tobacco Use   Smoking status: Former    Packs/day: 0.25    Years: 34.00    Pack years: 8.50    Types: Cigarettes    Quit date: 05/13/2021    Years since quitting: 0.0   Smokeless tobacco: Never   Tobacco comments:    smokes 2 cig daily  Vaping Use   Vaping Use: Never used  Substance and Sexual Activity   Alcohol use: No    Alcohol/week: 0.0 standard drinks   Drug use: No   Sexual activity: Yes    Birth control/protection: Surgical, Post-menopausal    Comment: hyst  Other Topics Concern   Not on file  Social History Narrative   Not on file   Social Determinants of Health   Financial Resource Strain: Not on file  Food Insecurity: Not on file  Transportation  Needs: Not on file  Physical Activity: Not on file  Stress: Not on file  Social Connections: Not on file    Family History  Problem Relation Age of Onset   Hypertension Mother    Liver cancer Mother        remission for 2 years   Cancer Mother        lung   Cancer Father    Thyroid disease Father    Hypertension Father    Diabetes Father    Dementia Father    Heart attack Father    Hyperlipidemia Father    Other Sister        liver trouble   Diabetes Brother    Hyperthyroidism Brother    Dementia Maternal Grandmother    Diabetes Paternal Grandmother    Diabetes Paternal Grandfather    Colon cancer Neg Hx     Outpatient Encounter Medications as of 06/13/2021  Medication Sig   insulin aspart (NOVOLOG) 100 UNIT/ML FlexPen Inject 6-12 Units into the skin 3 (three) times daily before meals.   levothyroxine (SYNTHROID) 137 MCG tablet Take 137 mcg by mouth daily before breakfast.   pravastatin (PRAVACHOL) 20 MG tablet Take 20 mg by mouth daily.   pravastatin (PRAVACHOL) 20  MG tablet Take 20 mg by mouth daily.   ADVAIR DISKUS 250-50 MCG/ACT AEPB 1 puff 2 (two) times daily.   albuterol (PROVENTIL HFA;VENTOLIN HFA) 108 (90 Base) MCG/ACT inhaler Inhale 2 puffs into the lungs every 6 (six) hours as needed for wheezing or shortness of breath.   albuterol (PROVENTIL) (2.5 MG/3ML) 0.083% nebulizer solution Take 2.5 mg by nebulization every 6 (six) hours as needed for wheezing or shortness of breath. (Patient not taking: Reported on 06/13/2021)   fluticasone (FLONASE) 50 MCG/ACT nasal spray Place 2 sprays into both nostrils daily.    gabapentin (NEURONTIN) 100 MG capsule Take 200 mg by mouth 4 (four) times daily.   insulin glargine (LANTUS SOLOSTAR) 100 UNIT/ML Solostar Pen Inject 34 Units into the skin at bedtime.   metroNIDAZOLE (FLAGYL) 500 MG tablet Take 1 tablet (500 mg total) by mouth 2 (two) times daily. (Patient not taking: Reported on 06/13/2021)   nitrofurantoin, macrocrystal-monohydrate, (MACROBID) 100 MG capsule Take 1 capsule (100 mg total) by mouth 2 (two) times daily. (Patient not taking: Reported on 06/13/2021)   omeprazole (PRILOSEC) 40 MG capsule Take 40 mg by mouth daily.   ondansetron (ZOFRAN ODT) 4 MG disintegrating tablet Take 1 tablet (4 mg total) by mouth every 6 (six) hours as needed for nausea.   promethazine (PHENERGAN) 25 MG tablet Take 1 tablet (25 mg total) by mouth every 6 (six) hours as needed for nausea or vomiting. (Patient not taking: Reported on 06/13/2021)   traZODone (DESYREL) 100 MG tablet Take 100 mg by mouth at bedtime.    [DISCONTINUED] Insulin Glargine (LANTUS SOLOSTAR) 100 UNIT/ML Solostar Pen Inject 22 Units into the skin at bedtime. (Patient taking differently: Inject 34 Units into the skin at bedtime.)   [DISCONTINUED] insulin lispro (HUMALOG) 100 UNIT/ML injection Inject 5-10 Units into the skin 3 (three) times daily as needed for high blood sugar (Uses 3 timesdaily according to sliding scale).   [DISCONTINUED] levothyroxine  (SYNTHROID, LEVOTHROID) 150 MCG tablet Take 137 mcg by mouth daily before breakfast.   [DISCONTINUED] loratadine (CLARITIN) 10 MG tablet Take 1 tablet (10 mg total) by mouth daily as needed for allergies or rhinitis.   No facility-administered encounter medications on file as of 06/13/2021.  ALLERGIES: Allergies  Allergen Reactions   Phenytoin Anaphylaxis    Dilantin-Reaction: SJS (STEVENS JOHNSON SYNDROME)   Sulfonamide Derivatives Nausea And Vomiting   Tetracyclines & Related Nausea And Vomiting    VACCINATION STATUS: Immunization History  Administered Date(s) Administered   Influenza-Unspecified 09/23/2011   Pneumococcal-Unspecified 09/23/2011    Diabetes She presents for her initial diabetic visit. She has type 1 diabetes mellitus. Onset time: She was diagnosed at approximate age of 44 years. Her disease course has been worsening. There are no hypoglycemic associated symptoms. Pertinent negatives for hypoglycemia include no confusion, headaches, pallor or seizures. Associated symptoms include fatigue, polydipsia and polyuria. Pertinent negatives for diabetes include no chest pain and no polyphagia. There are no hypoglycemic complications. Symptoms are worsening. Diabetic complications include autonomic neuropathy, a CVA and nephropathy. Risk factors for coronary artery disease include diabetes mellitus, dyslipidemia, family history, sedentary lifestyle, post-menopausal and tobacco exposure. Current diabetic treatment includes insulin injections. Compliance with diabetes treatment: Patient was previously seen in this clinic more than 6 years ago, did not return for follow-up. Her weight is fluctuating minimally. She is following a generally unhealthy diet. When asked about meal planning, she reported none. She has not had a previous visit with a dietitian. She rarely participates in exercise. Her home blood glucose trend is increasing steadily. Her overall blood glucose range is >200  mg/dl. (She brings in her meter showing inadequate monitoring.  She has 22 readings in the last 15 days averaging 212.  Her recent A1c was 9.4%.  She did not document or report hypoglycemia.) An ACE inhibitor/angiotensin II receptor blocker is not being taken. Eye exam is not current.  Hyperlipidemia This is a chronic problem. The current episode started more than 1 year ago. The problem is uncontrolled. Exacerbating diseases include diabetes and hypothyroidism. Pertinent negatives include no chest pain, myalgias or shortness of breath. Current antihyperlipidemic treatment includes statins. Risk factors for coronary artery disease include diabetes mellitus, dyslipidemia, family history, post-menopausal and a sedentary lifestyle.  Thyroid Problem Presents for initial visit. Onset time: She was diagnosed more than 10 years ago.  Currently on levothyroxine 137 mcg p.o. daily.  Her most recent TSH was within target. Symptoms include fatigue. Patient reports no cold intolerance, diarrhea, heat intolerance or palpitations. Past treatments include levothyroxine. The following procedures have not been performed: radioiodine uptake scan and thyroidectomy. Her past medical history is significant for diabetes and hyperlipidemia.    Review of Systems  Constitutional:  Positive for fatigue. Negative for chills, fever and unexpected weight change.  HENT:  Negative for trouble swallowing and voice change.   Eyes:  Negative for visual disturbance.  Respiratory:  Negative for cough, shortness of breath and wheezing.   Cardiovascular:  Negative for chest pain, palpitations and leg swelling.  Gastrointestinal:  Negative for diarrhea, nausea and vomiting.  Endocrine: Positive for polydipsia and polyuria. Negative for cold intolerance, heat intolerance and polyphagia.  Musculoskeletal:  Negative for arthralgias and myalgias.  Skin:  Negative for color change, pallor, rash and wound.  Neurological:  Negative for seizures  and headaches.  Psychiatric/Behavioral:  Negative for confusion and suicidal ideas.    Objective:    Vitals with BMI 06/13/2021 02/08/2020 06/08/2019  Height 5\' 4"  5\' 4"  -  Weight 173 lbs 3 oz 165 lbs -  BMI 22.02 54.27 -  Systolic 062 376 283  Diastolic 72 77 59  Pulse 60 77 68  Some encounter information is confidential and restricted. Go to Review Flowsheets activity to see  all data.    BP 124/72   Pulse 60   Ht 5\' 4"  (1.626 m)   Wt 173 lb 3.2 oz (78.6 kg)   BMI 29.73 kg/m   Wt Readings from Last 3 Encounters:  06/13/21 173 lb 3.2 oz (78.6 kg)  02/08/20 165 lb (74.8 kg)  06/07/19 154 lb 15.7 oz (70.3 kg)     Physical Exam Constitutional:      Appearance: She is well-developed.  HENT:     Head: Normocephalic and atraumatic.  Neck:     Thyroid: No thyromegaly.     Trachea: No tracheal deviation.  Cardiovascular:     Rate and Rhythm: Normal rate and regular rhythm.  Pulmonary:     Effort: Pulmonary effort is normal.     Breath sounds: Normal breath sounds.  Abdominal:     General: Bowel sounds are normal.     Palpations: Abdomen is soft.     Tenderness: There is no abdominal tenderness. There is no guarding.  Musculoskeletal:        General: Normal range of motion.     Cervical back: Normal range of motion and neck supple.  Skin:    General: Skin is warm and dry.     Coloration: Skin is not pale.     Findings: No erythema or rash.  Neurological:     Mental Status: She is alert and oriented to person, place, and time.     Cranial Nerves: No cranial nerve deficit.     Coordination: Coordination normal.     Deep Tendon Reflexes: Reflexes are normal and symmetric.  Psychiatric:        Judgment: Judgment normal.      CMP ( most recent) CMP     Component Value Date/Time   NA 134 (L) 06/08/2019 0255   K 4.8 06/08/2019 0521   CL 102 06/08/2019 0255   CO2 24 06/08/2019 0255   GLUCOSE 360 (H) 06/08/2019 0255   BUN 16 06/08/2019 0255   CREATININE 0.80  06/08/2019 0255   CALCIUM 8.8 (L) 06/08/2019 0255   PROT 7.1 06/07/2019 2022   ALBUMIN 3.8 06/07/2019 2022   AST 53 (H) 06/07/2019 2022   ALT 27 06/07/2019 2022   ALKPHOS 84 06/07/2019 2022   BILITOT 0.5 06/07/2019 2022   GFRNONAA >60 06/08/2019 0255   GFRAA >60 06/08/2019 0255     Diabetic Labs (most recent): Lab Results  Component Value Date   HGBA1C 9.4 04/11/2021   HGBA1C 9.6 (H) 03/07/2018   HGBA1C 11.0 (H) 02/21/2014     Lipid Panel ( most recent) Lipid Panel     Component Value Date/Time   CHOL 259 (A) 04/11/2021 0000   TRIG 101 04/11/2021 0000   HDL 120 (A) 04/11/2021 0000   CHOLHDL 2.7 12/31/2010 0524   VLDL 13 12/31/2010 0524   LDLCALC 122 04/11/2021 0000       Results for GENEVIVE, PRINTUP (MRN [id number]) as of 06/13/2021 21:32  Ref. Range 02/20/2014 23:26 02/21/2014 02:21  TSH Latest Ref Range: 0.350 - 4.500 uIU/mL 2.670   T4,Free(Direct) Latest Ref Range: 0.80 - 1.80 ng/dL  1.10      Assessment & Plan:   1. Uncontrolled type 1 diabetes mellitus with hyperglycemia (HCC)   - ARIATNA JESTER has currently uncontrolled symptomatic type 2 DM since  60 years of age,  with most recent A1c of 9.4 %. Recent labs reviewed. - I had a long discussion with her about the progressive nature of  diabetes and the pathology behind its complications. -her diabetes is complicated by neuropathy, CVA and she remains at a high risk for more acute and chronic complications which include CAD, CVA, CKD, retinopathy, and neuropathy. These are all discussed in detail with her.  - I have counseled her on diet  and weight management  by adopting a carbohydrate restricted/protein rich diet. Patient is encouraged to switch to  unprocessed or minimally processed     complex starch and increased protein intake (animal or plant source), fruits, and vegetables. -  she is advised to stick to a routine mealtimes to eat 3 meals  a day and avoid unnecessary snacks ( to snack only to correct  hypoglycemia).   - she acknowledges that there is a room for improvement in her food and drink choices. - Suggestion is made for her to avoid simple carbohydrates  from her diet including Cakes, Sweet Desserts, Ice Cream, Soda (diet and regular), Sweet Tea, Candies, Chips, Cookies, Store Bought Juices, Alcohol in Excess of  1-2 drinks a day, Artificial Sweeteners,  Coffee Creamer, and "Sugar-free" Products. This will help patient to have more stable blood glucose profile and potentially avoid unintended weight gain.  - she will be scheduled with Jearld Fenton, RDN, CDE for diabetes education.  - I have approached her with the following individualized plan to manage  her diabetes and patient agrees:   -In light of her presentation with type 1 diabetes with uncontrolled glycemic profile and complications, she will continue to need intensive treatment with basal/bolus insulin in order for her to achieve and maintain control of diabetes to target.  Accordingly, she is advised to adjust her Lantus to 34 units nightly, adjust her NovoLog at 6  units 3 times a day with meals  for pre-meal BG readings of 90-150mg /dl, plus patient specific correction dose for unexpected hyperglycemia above 150mg /dl, associated with strict monitoring of glucose 4 times a day-before meals and at bedtime. - she is warned not to take insulin without proper monitoring per orders. - Adjustment parameters are given to her for hypo and hyperglycemia in writing. - she is encouraged to call clinic for blood glucose levels less than 70 or above 300 mg /dl. She is not a candidate for non-insulin options. This patient will benefit from a CGM device.  She would be considered for freestyle libre device during her next visit in 1 week. - Specific targets for  A1c;  LDL, HDL,  and Triglycerides were discussed with the patient.  2) Blood Pressure /Hypertension:  her blood pressure is controlled to target.   she is not on any  antihypertensive medication.     3) Lipids/Hyperlipidemia:   Review of her recent lipid panel showed un controlled  LDL at 122 .  she  is advised to continue    pravastatin 20 mg daily at bedtime.  Side effects and precautions discussed with her.  4)  Weight/Diet:  Body mass index is 29.73 kg/m.  -    she is  a candidate for some weight loss. I discussed with her the fact that loss of 5 - 10% of her  current body weight will have the most impact on her diabetes management.  Exercise, and detailed carbohydrates information provided  -  detailed on discharge instructions.  5) hypothyroidism: Longstanding and stable diagnosis.  Her recent thyroid function tests are consistent with appropriate replacement.  She is advised to continue levothyroxine 137 mcg p.o. daily before breakfast.  - We discussed about  the correct intake of her thyroid hormone, on empty stomach at fasting, with water, separated by at least 30 minutes from breakfast and other medications,  and separated by more than 4 hours from calcium, iron, multivitamins, acid reflux medications (PPIs). -Patient is made aware of the fact that thyroid hormone replacement is needed for life, dose to be adjusted by periodic monitoring of thyroid function tests.   6) Chronic Care/Health Maintenance:  -she  is on Statin medications and  is encouraged to initiate and continue to follow up with Ophthalmology, Dentist,  Podiatrist at least yearly or according to recommendations, and advised to   stay away from smoking. I have recommended yearly flu vaccine and pneumonia vaccine at least every 5 years; moderate intensity exercise for up to 150 minutes weekly; and  sleep for at least 7 hours a day.  - she is  advised to maintain close follow up with Rosita Fire, MD for primary care needs, as well as her other providers for optimal and coordinated care.   I spent 66 minutes in the care of the patient today including review of labs from [facility], Lipids,  Thyroid Function, Hematology (current and previous including abstractions from other facilities); face-to-face time discussing  her blood glucose readings/logs, discussing hypoglycemia and hyperglycemia episodes and symptoms, medications doses, her options of short and long term treatment based on the latest standards of care / guidelines;  discussion about incorporating lifestyle medicine;  and documenting the encounter.     Please refer to Patient Instructions for Blood Glucose Monitoring and Insulin/Medications Dosing Guide"  in media tab for additional information. Please  also refer to " Patient Self Inventory" in the Media  tab for reviewed elements of pertinent patient history.  Fanny Bien participated in the discussions, expressed understanding, and voiced agreement with the above plans.  All questions were answered to her satisfaction. she is encouraged to contact clinic should she have any questions or concerns prior to her return visit.   Follow up plan: - Return in about 1 week (around 06/20/2021) for F/U with Meter and Logs Only - no Labs.  Glade Lloyd, MD [facility] [address] [place], [place] [address] Phone: [phone]  Fax: [phone]    06/13/2021, 3:43 PM  This note was partially dictated with voice recognition software. Similar sounding words can be transcribed inadequately or may not  be corrected upon review.

## 2021-06-13 NOTE — Patient Instructions (Signed)

## 2021-06-17 ENCOUNTER — Other Ambulatory Visit (HOSPITAL_COMMUNITY): Payer: Self-pay | Admitting: Adult Health

## 2021-06-17 DIAGNOSIS — Z1231 Encounter for screening mammogram for malignant neoplasm of breast: Secondary | ICD-10-CM

## 2021-06-20 ENCOUNTER — Ambulatory Visit: Payer: 59 | Admitting: "Endocrinology

## 2021-06-27 ENCOUNTER — Ambulatory Visit (HOSPITAL_COMMUNITY): Payer: Medicare HMO

## 2021-07-10 ENCOUNTER — Ambulatory Visit (HOSPITAL_COMMUNITY): Payer: 59

## 2021-07-22 ENCOUNTER — Ambulatory Visit: Payer: Medicare HMO | Admitting: Nutrition

## 2021-07-22 ENCOUNTER — Ambulatory Visit: Payer: 59 | Admitting: "Endocrinology

## 2022-03-31 ENCOUNTER — Other Ambulatory Visit (HOSPITAL_COMMUNITY): Payer: Self-pay | Admitting: Pain Medicine

## 2022-03-31 ENCOUNTER — Other Ambulatory Visit: Payer: Self-pay | Admitting: Dermatology

## 2022-03-31 ENCOUNTER — Other Ambulatory Visit: Payer: Self-pay | Admitting: Pain Medicine

## 2022-03-31 DIAGNOSIS — M5416 Radiculopathy, lumbar region: Secondary | ICD-10-CM

## 2022-04-11 ENCOUNTER — Ambulatory Visit (HOSPITAL_COMMUNITY)
Admission: RE | Admit: 2022-04-11 | Discharge: 2022-04-11 | Disposition: A | Discharge status: Home / Self Care | Payer: 59 | Source: Ambulatory Visit | Attending: Pain Medicine | Admitting: Pain Medicine

## 2022-04-11 DIAGNOSIS — M5416 Radiculopathy, lumbar region: Secondary | ICD-10-CM | POA: Diagnosis not present

## 2022-07-16 ENCOUNTER — Ambulatory Visit (INDEPENDENT_AMBULATORY_CARE_PROVIDER_SITE_OTHER): Payer: Medicare HMO | Admitting: Internal Medicine

## 2022-07-16 ENCOUNTER — Encounter: Payer: Self-pay | Admitting: Internal Medicine

## 2022-07-16 VITALS — BP 130/80 | HR 90 | Ht 64.0 in | Wt 186.0 lb

## 2022-07-16 DIAGNOSIS — E782 Mixed hyperlipidemia: Secondary | ICD-10-CM | POA: Diagnosis not present

## 2022-07-16 DIAGNOSIS — E1042 Type 1 diabetes mellitus with diabetic polyneuropathy: Secondary | ICD-10-CM

## 2022-07-16 DIAGNOSIS — E039 Hypothyroidism, unspecified: Secondary | ICD-10-CM

## 2022-07-16 DIAGNOSIS — E1065 Type 1 diabetes mellitus with hyperglycemia: Secondary | ICD-10-CM | POA: Diagnosis not present

## 2022-07-16 LAB — BASIC METABOLIC PANEL
BUN: 15 mg/dL (ref 6–23)
CO2: 27 mEq/L (ref 19–32)
Calcium: 9.7 mg/dL (ref 8.4–10.5)
Chloride: 104 mEq/L (ref 96–112)
Creatinine, Ser: 0.72 mg/dL (ref 0.40–1.20)
GFR: 89.31 mL/min (ref >60.00)
Glucose, Bld: 187 mg/dL — ABNORMAL HIGH (ref 70–99)
Potassium: 4.8 mEq/L (ref 3.5–5.1)
Sodium: 139 mEq/L (ref 135–145)

## 2022-07-16 LAB — POCT GLUCOSE (DEVICE FOR HOME USE): POC Glucose: 180 mg/dl — AB (ref 70–99)

## 2022-07-16 LAB — POCT GLYCOSYLATED HEMOGLOBIN (HGB A1C): Hemoglobin A1C: 8.2 % — AB (ref 4.0–5.6)

## 2022-07-16 LAB — LIPID PANEL
Cholesterol: 169 mg/dL (ref 0–200)
HDL: 77.8 mg/dL (ref >39.00)
LDL Cholesterol: 75 mg/dL (ref 0–99)
NonHDL: 90.89
Total CHOL/HDL Ratio: 2
Triglycerides: 79 mg/dL (ref 0.0–149.0)
VLDL: 15.8 mg/dL (ref 0.0–40.0)

## 2022-07-16 LAB — TSH: TSH: 0.51 u[IU]/mL (ref 0.35–5.50)

## 2022-07-16 MED ORDER — DEXCOM G6 TRANSMITTER MISC: 1.0000 | 3 refills | Status: AC

## 2022-07-16 MED ORDER — DEXCOM G6 RECEIVER DEVI: 1.0000 | 0 refills | Status: AC

## 2022-07-16 MED ORDER — DEXCOM G6 SENSOR MISC: 1.0000 | 3 refills | Status: AC

## 2022-07-16 NOTE — Progress Notes (Addendum)
Name: [name]  MRN/ DOB: [id number], Nov 28, 1960   Age/ Sex: 61 y.o., female    PCP: Wendi Maya, NP   Reason for Endocrinology Evaluation: Type 1 Diabetes Mellitus     Date of Initial Endocrinology Visit: 07/16/2022     PATIENT IDENTIFIER: [name] is a 61 y.o. female with a past medical history of Dm, HTn and dyslipidemia . The patient presented for initial endocrinology clinic visit on 07/16/2022 for consultative assistance with her diabetes management.    HPI: [name] was    Diagnosed with DM at age 57 Prior Medications tried/Intolerance: she used to be on insulin pump and dexcom  Currently checking blood sugars 3 x / day.  Hypoglycemia episodes : yes               Symptoms: yes                 Frequency: frequent  Hemoglobin A1c has ranged from 8.2% in 2023, peaking at 12% . Patient has required hospitalization within the last 1 year from hyper or hypoglycemia: no  In terms of diet, the patient eats 1 meal a day , snacks during the day   She has chronic nausea, but no diarrhea   HOME DIABETES REGIMEN: Lantus 34 units daily  Novolog 10 units    Statin: yes ACE-I/ARB: no Prior Diabetic Education: yes   METER DOWNLOAD SUMMARY: did not bring    DIABETIC COMPLICATIONS: Microvascular complications:  Neuropathy Denies: CKD  Last eye exam: Completed 2023  Macrovascular complications:   Denies: CAD, PVD, CVA   PAST HISTORY: Past Medical History:  Past Medical History:  Diagnosis Date   Anxiety    Arthritis    rheumatoid    BV (bacterial vaginosis) 05/23/2015   Chronic abdominal pain    COPD (chronic obstructive pulmonary disease) ([place])    Diabetes mellitus    type I   GERD (gastroesophageal reflux disease)    Hiatal hernia    Hyperlipidemia    Hypertension    Hypothyroid    Polyneuropathy in diabetes ([place])    PUD (peptic ulcer disease)    ? remote past, no reports from [facility] or [facility], states possibly Dr. Laural Golden.     Seizures ([place])    had in the past with hypoglycemia.   Stroke [facility])    no deficits.   Trichimoniasis    Unspecified polyarthropathy or polyarthritis, site unspecified    Vaginal discharge 05/23/2015   Past Surgical History:  Past Surgical History:  Procedure Laterality Date   ABDOMINAL HYSTERECTOMY     APPENDECTOMY     BLADDER REPAIR     CHOLECYSTECTOMY N/A 06/19/2017   Procedure: LAPAROSCOPIC CHOLECYSTECTOMY;  Surgeon: Aviva Signs, MD;  Location: [facility];  Service: General;  Laterality: N/A;   COLONOSCOPY N/A 06/01/2015   PJK:DTOIZTIW hemorrhoid diminutive rectosigmoid otherwise normal   ESOPHAGOGASTRODUODENOSCOPY     in remote past, unclear who performed. No op notes from [facility] or [facility] (pt thought Dr. Laural Golden)   ESOPHAGOGASTRODUODENOSCOPY  02/12/2012   Dr. Gala Romney:  Normal esophagus status post 21 F dilation. Small hiatal hernia. mild chronic gastritis   ESOPHAGOGASTRODUODENOSCOPY N/A 02/21/2014   PYK:DXIPJA exudative reflux esophagitis-and + Candida esophagitis.   ESOPHAGOGASTRODUODENOSCOPY N/A 06/01/2015   SNK:NLZJQ HH otherwise normal   TONSILLECTOMY      Social History:  reports that she has been smoking cigarettes. She has a 8.50 pack-year smoking history. She has never used smokeless tobacco. She reports that she  does not drink alcohol and does not use drugs. Family History:  Family History  Problem Relation Age of Onset   Hypertension Mother    Liver cancer Mother        remission for 2 years   Cancer Mother        lung   Cancer Father    Thyroid disease Father    Hypertension Father    Diabetes Father    Dementia Father    Heart attack Father    Hyperlipidemia Father    Other Sister        liver trouble   Diabetes Brother    Hyperthyroidism Brother    Dementia Maternal Grandmother    Diabetes Paternal Grandmother    Diabetes Paternal Grandfather    Colon cancer Neg Hx      HOME MEDICATIONS: Allergies as of 07/16/2022       Reactions   Phenytoin Anaphylaxis    Dilantin-Reaction: SJS (STEVENS JOHNSON SYNDROME)   Sulfonamide Derivatives Nausea And Vomiting   Tetracyclines & Related Nausea And Vomiting        Medication List        Accurate as of July 16, 2022 12:59 PM. If you have any questions, ask your nurse or doctor.          STOP taking these medications    metroNIDAZOLE 500 MG tablet Commonly known as: FLAGYL Stopped by: Dorita Sciara, MD   nitrofurantoin (macrocrystal-monohydrate) 100 MG capsule Commonly known as: MACROBID Stopped by: Dorita Sciara, MD   omeprazole 40 MG capsule Commonly known as: PRILOSEC Stopped by: Dorita Sciara, MD   ondansetron 4 MG disintegrating tablet Commonly known as: Zofran ODT Stopped by: Dorita Sciara, MD       TAKE these medications    Advair Diskus 250-50 MCG/ACT Aepb Generic drug: fluticasone-salmeterol 1 puff 2 (two) times daily.   albuterol (2.5 MG/3ML) 0.083% nebulizer solution Commonly known as: PROVENTIL Take 2.5 mg by nebulization every 6 (six) hours as needed for wheezing or shortness of breath.   albuterol 108 (90 Base) MCG/ACT inhaler Commonly known as: VENTOLIN HFA Inhale 2 puffs into the lungs every 6 (six) hours as needed for wheezing or shortness of breath.   dexlansoprazole 60 MG capsule Commonly known as: DEXILANT Take 60 mg by mouth daily.   fluticasone 50 MCG/ACT nasal spray Commonly known as: FLONASE Place 2 sprays into both nostrils daily.   gabapentin 100 MG capsule Commonly known as: NEURONTIN Take 200 mg by mouth 4 (four) times daily.   insulin aspart 100 UNIT/ML FlexPen Commonly known as: NOVOLOG Inject 6-12 Units into the skin 3 (three) times daily before meals.   Lantus SoloStar 100 UNIT/ML Solostar Pen Generic drug: insulin glargine Inject 34 Units into the skin at bedtime.   levothyroxine 125 MCG tablet Commonly known as: SYNTHROID Take 125 mcg by mouth daily before breakfast. What changed: Another  medication with the same name was removed. Continue taking this medication, and follow the directions you see here. Changed by: Dorita Sciara, MD   pravastatin 20 MG tablet Commonly known as: PRAVACHOL Take 20 mg by mouth daily.   promethazine 25 MG tablet Commonly known as: PHENERGAN Take 1 tablet (25 mg total) by mouth every 6 (six) hours as needed for nausea or vomiting.   traZODone 100 MG tablet Commonly known as: DESYREL Take 100 mg by mouth at bedtime.         ALLERGIES: Allergies  Allergen Reactions  Phenytoin Anaphylaxis    Dilantin-Reaction: SJS (STEVENS JOHNSON SYNDROME)   Sulfonamide Derivatives Nausea And Vomiting   Tetracyclines & Related Nausea And Vomiting     REVIEW OF SYSTEMS: A comprehensive ROS was conducted with the patient and is negative except as per HPI and below:  ROS    OBJECTIVE:   VITAL SIGNS: BP 130/80 (BP Location: Left Arm, Patient Position: Sitting, Cuff Size: Small)   Pulse 90   Ht '5\' 4"'$  (1.626 m)   Wt 186 lb (84.4 kg)   SpO2 97%   BMI 31.93 kg/m    PHYSICAL EXAM:  General: Pt appears well and is in NAD  Neck: General: Supple without adenopathy or carotid bruits. Thyroid: Thyroid size normal.  No goiter or nodules appreciated.   Lungs: Clear with good BS bilat with no rales, rhonchi, or wheezes  Heart: RRR   Abdomen:  soft, nontender  Extremities:  Lower extremities - No pretibial edema. No lesions.  Neuro: MS is good with appropriate affect, pt is alert and Ox3     DATA REVIEWED:  Lab Results  Component Value Date   HGBA1C 9.4 04/11/2021   HGBA1C 9.6 (H) 03/07/2018   HGBA1C 11.0 (H) 02/21/2014    Latest Reference Range & Units 07/16/22 13:23  Sodium 135 - 145 mEq/L 139  Potassium 3.5 - 5.1 mEq/L 4.8  Chloride 96 - 112 mEq/L 104  CO2 19 - 32 mEq/L 27  Glucose 70 - 99 mg/dL 187 (H)  BUN 6 - 23 mg/dL 15  Creatinine 0.40 - 1.20 mg/dL 0.72  Calcium 8.4 - 10.5 mg/dL 9.7  GFR >60.00 mL/min 89.31      Latest Reference Range & Units 07/16/22 13:23  Total CHOL/HDL Ratio  2  Cholesterol 0 - 200 mg/dL 169  HDL Cholesterol >39.00 mg/dL 77.80  LDL (calc) 0 - 99 mg/dL 75  NonHDL  90.89  Triglycerides 0.0 - 149.0 mg/dL 79.0  VLDL 0.0 - 40.0 mg/dL 15.8    Latest Reference Range & Units 07/16/22 13:23  TSH 0.35 - 5.50 uIU/mL 0.51    ASSESSMENT / PLAN / RECOMMENDATIONS:   1) Type 1 Diabetes Mellitus, poorly controlled, With neuropathic  complications - Most recent A1c of 8.2 %. Goal A1c < 7.0 %.    Plan: GENERAL: I have discussed with the patient the pathophysiology of diabetes. We went over the natural progression of the disease. We stressed the importance of lifestyle changes. I explained the complications associated with diabetes including retinopathy, nephropathy, neuropathy as well as increased risk of cardiovascular disease. We went over the benefit seen with glycemic control.   I explained to the patient that diabetic patients are at higher than normal risk for amputations.  Patient endorses hypoglycemia overnight, will reduce basal insulin Will increase prandial dose of insulin, she will also be given a prandial dose for snacks Patient will be provided with a correction scale to use before each meal She is interested in insulin pump technology, she was given the phone number to the tandem helpline to start the process, she was also referred to our CDE for training A prescription for Dexcom has been faxed to her DME supplier  MEDICATIONS: Decrease Lantus 30 units daily Increase NovoLog 12 units 3 times daily before every meal Take NovoLog 4 units with snacks Start correction factor :NovoLog (BG -130/35)  EDUCATION / INSTRUCTIONS: BG monitoring instructions: Patient is instructed to check her blood sugars 3 times a day, before each meal. Call [facility] if: BG  persistently < 70  I reviewed the Rule of 15 for the treatment of hypoglycemia in detail with the  patient. Literature supplied.   2) Diabetic complications:  Eye: Does not have known diabetic retinopathy.  Neuro/ Feet: Does  have known diabetic peripheral neuropathy. Renal: Patient does not have known baseline CKD. She is not on an ACEI/ARB at present.  3) Dyslipidemia:  -LDL and TG at goal  Medication Continue pravastatin 20 mg daily  4)Hypothyroidism:  -TSH within normal range   Medications Continue levothyroxine 125 mcg daily   Follow-up in 4 months Signed electronically by: Mack Guise, MD  [facility]  [address], [place] [address] Phone: [phone] FAX: [phone]   CC: Wendi Maya, NP [place] [address] Phone: [phone]  Fax: [phone]    Return to Endocrinology clinic as below: Future Appointments  Date Time Provider [place]  07/16/2022  1:20 PM Harinder Romas, Melanie Crazier, MD [facility] None

## 2022-07-16 NOTE — Patient Instructions (Signed)
Decrease Lantus to 30 units daily  Increase Novolog 12 units with each meal  Take Novolog 4 units with a snack  Novolog correctional insulin: ADD extra units on insulin to your meal-time Novolog dose if your blood sugars are higher than 165. Use the scale below to help guide you:   Blood sugar before meal Number of units to inject  Less than 165 0 unit  166 -  200 1 units  201 -  235 2 units  236 -  270 3 units  271 -  305 4 units  306 -  340 5 units  341 -  375 6 units  376 -  410 7 units   Call Tandem Help Line at [phone] to start the pump process     HOW TO TREAT LOW BLOOD SUGARS (Blood sugar LESS THAN 70 MG/DL) Please follow the RULE OF 15 for the treatment of hypoglycemia treatment (when your (blood sugars are less than 70 mg/dL)   STEP 1: Take 15 grams of carbohydrates when your blood sugar is low, which includes:  3-4 GLUCOSE TABS  OR 3-4 OZ OF JUICE OR REGULAR SODA OR ONE TUBE OF GLUCOSE GEL    STEP 2: RECHECK blood sugar in 15 MINUTES STEP 3: If your blood sugar is still low at the 15 minute recheck --> then, go back to STEP 1 and treat AGAIN with another 15 grams of carbohydrates.

## 2022-07-18 ENCOUNTER — Encounter: Payer: Self-pay | Admitting: Internal Medicine

## 2022-07-18 MED ORDER — LEVOTHYROXINE SODIUM 125 MCG PO TABS: 125.0000 ug | ORAL_TABLET | Freq: Every day | ORAL | 3 refills | Status: DC

## 2022-07-21 ENCOUNTER — Telehealth: Payer: Self-pay | Admitting: Internal Medicine

## 2022-07-21 ENCOUNTER — Other Ambulatory Visit: Payer: Self-pay | Admitting: Internal Medicine

## 2022-07-21 DIAGNOSIS — E1065 Type 1 diabetes mellitus with hyperglycemia: Secondary | ICD-10-CM

## 2022-07-21 NOTE — Telephone Encounter (Signed)
Patient returned call and scheduled a fasting lab appointment for Monday 07-28-22

## 2022-07-21 NOTE — Telephone Encounter (Signed)
I Have filled her Tandem pump forms but they need fasting labs to include sugar and C-Peptide   Please schedule her for fasting labs ( nothing to eat/drink after midnight)   Thanks

## 2022-07-21 NOTE — Telephone Encounter (Signed)
Attempted to contact patient no answer and vm is full

## 2022-07-25 NOTE — Telephone Encounter (Signed)
Tandem lvm following up on request for fasting labs and C-Peptide. Advised they would be resending form via fax.

## 2022-07-28 ENCOUNTER — Other Ambulatory Visit: Payer: Medicare HMO

## 2022-08-01 ENCOUNTER — Other Ambulatory Visit: Payer: Self-pay | Admitting: Internal Medicine

## 2022-08-01 MED ORDER — INSULIN ASPART 100 UNIT/ML IJ SOLN: INTRAMUSCULAR | 3 refills | Status: DC

## 2022-08-04 ENCOUNTER — Telehealth: Payer: Self-pay

## 2022-08-04 NOTE — Telephone Encounter (Signed)
Patient has received her pump and her Dexcom and would like to be set up for training.

## 2022-08-11 ENCOUNTER — Telehealth: Payer: Self-pay | Admitting: Nutrition

## 2022-08-11 NOTE — Telephone Encounter (Signed)
Tried X2 today to schedule pump start training, but voice mail is full and can not accept messages

## 2022-08-12 NOTE — Telephone Encounter (Signed)
No answer.  Voicemail is full.  Not able to leave a message to scheduled pump training

## 2022-08-25 ENCOUNTER — Telehealth: Payer: Self-pay | Admitting: Nutrition

## 2022-08-25 ENCOUNTER — Other Ambulatory Visit: Payer: Self-pay | Admitting: Internal Medicine

## 2022-08-25 MED ORDER — INSULIN ASPART 100 UNIT/ML IJ SOLN: INTRAMUSCULAR | 3 refills | Status: DC

## 2022-08-25 NOTE — Telephone Encounter (Signed)
Patient scheduled appt. For pump training on 09/01/22

## 2022-08-25 NOTE — Telephone Encounter (Signed)
Per [name] is fine

## 2022-08-25 NOTE — Telephone Encounter (Signed)
Left 2 voicemails to call me  11/20, and 11/28 to schedule pump trainings

## 2022-08-28 ENCOUNTER — Telehealth: Payer: Self-pay | Admitting: Internal Medicine

## 2022-08-28 MED ORDER — INSULIN ASPART 100 UNIT/ML IJ SOLN: INTRAMUSCULAR | 3 refills | Status: DC

## 2022-08-28 NOTE — Telephone Encounter (Signed)
Patient called and requested that her RX for Insulin Aspart be sent to [facility] in [place] - she no longer uses the [facility].  She has pump training this coming Monday

## 2022-08-28 NOTE — Telephone Encounter (Signed)
done

## 2022-09-01 ENCOUNTER — Encounter: Payer: Medicare HMO | Attending: Internal Medicine | Admitting: Nutrition

## 2022-09-01 DIAGNOSIS — E1042 Type 1 diabetes mellitus with diabetic polyneuropathy: Secondary | ICD-10-CM | POA: Insufficient documentation

## 2022-09-02 ENCOUNTER — Telehealth: Payer: Self-pay | Admitting: Nutrition

## 2022-09-02 NOTE — Telephone Encounter (Signed)
Patient was called last night, and 8PM.  Said she did not put the pump in Control IQ, nor give a supper bolus as yet.  She was talked through both of these, and reported that she dropped low to 41 at 5:30PM.  She was told that the pump had not delivered any insulin, until it went out of the temp basal rate mode at 7:30 this evening.   She was told to call me today  and had no final questions for me.

## 2022-09-02 NOTE — Telephone Encounter (Signed)
Patient reports that the PDM was "going off all night saying low blood sugars.  She treated it at 1:30 she ate 1/2 of a peanut butter sandwich and at 4 AM it was less that 50, and she ate the other half of the sandwich.  Her FBS this morning was 51 She was talked into reducing her basal rate by 0.1u/hr.  to 0.9

## 2022-09-02 NOTE — Patient Instructions (Signed)
After 7:30PM, put the pump in Control IQ If questions on how to use this pump, call tandem  help line If questions about how to use the Dexcom, call their help line Call office if blood sugar remain over 200, or they drop low

## 2022-09-02 NOTE — Progress Notes (Signed)
Patient was trained on the use of the Tandem insulin pump.  Her phone would not support the Dexcom G6 app, and she will be getting a reader for this.  She was told to set up the reader with date/time and to put the transmitter in the reader.  She will call the help line if questions.  The Tandem pump was set with transmitter ID and her sensor was started and linked to the Tandem pump. We discussed how this pump delivers insulin, and she reported that she had been on the Medtronic pump several years ago.  Settings were entered by the patient, per dr. Quin Hoop orders:  Basal rate: 1.0u/hr, ISF: 35, I/C 12, target: 130 with correction over 130.  Max bolus 20u and timing: 4 hours. She had not been using carb counting, but says she knows how to do this.  We reviewed what carbs were, and she was given a list of 15 gram portions sizes for most carbs.  She reported good understanding of this with no questions She filled a cartridge with Novolog insulin and attached a 9 mm soft set to her left abdomen without difficulty.  She was shown how to do boluses and corrections boluses and re demonstrated this correctly X3.  We reviewed all topics on the checklist and patient signed this as understanding all topics. She has a Arts development officer" and we were not able to turn the blue tooth on, to link her phone to the pump and to the T-connect app.  We did set up her T-connect account on her phone with user name as her email address and [phone]! As her password. I will call her tomorrow to see if has learned how to turn her blue tooth on and try to link her phone to the Tandem T-connect app. She forgot and took her Lantus dose last  night at 7:30PM.  He pump was taken out of control IQ and put in a 100% basal reduction mode until 7:30.  Steps were written for her to put her pump back in control IQ when this ends.  She agreed to do this.   She had no final questions.

## 2022-09-03 NOTE — Telephone Encounter (Signed)
Patient reports no low blood sugars during the night, and FBS today was 140.

## 2022-09-03 NOTE — Telephone Encounter (Signed)
Patient reports no low blood sugars last night, and FBS today was 250.  Says has no questions for me at this time.

## 2022-09-09 ENCOUNTER — Telehealth: Payer: Self-pay | Admitting: Nutrition

## 2022-09-09 NOTE — Telephone Encounter (Signed)
Late entry:  Patient called last night after I called her to check on her pump usage.  She reports that she is not able to get the dexcom to accept the transmitter. She said she tried calling Dexcom but would prefer to talk to me.  She could not take a picture for me on her phone, and we tried for 10 minutes to insert the transmitter.  She was told to call Dexcom back and to have them help her.  I tried calling her this morning at 8AM, and again at [place].  Not able to leave a voice message because voice mail is full.  No home phone.   Tried again just now and  not able to leave a message.

## 2022-09-10 NOTE — Telephone Encounter (Signed)
Not able to connect call.  Operator said number can not be reached.  I texted her to call me.

## 2022-09-18 ENCOUNTER — Telehealth: Payer: Self-pay

## 2022-09-18 MED ORDER — ACCU-CHEK GUIDE VI STRP: ORAL_STRIP | 12 refills | Status: AC

## 2022-09-18 NOTE — Telephone Encounter (Signed)
LMTCB

## 2022-09-18 NOTE — Telephone Encounter (Signed)
Patient left vm stating that she has been experiencing lots nausea since starting the insulin pump. Would like to know if promethazine can be called in.

## 2022-09-18 NOTE — Telephone Encounter (Signed)
Patient will contact her pcp

## 2022-11-19 ENCOUNTER — Ambulatory Visit: Payer: Medicare HMO | Admitting: Internal Medicine

## 2022-12-10 NOTE — Progress Notes (Unsigned)
Name: [name]  MRN/ DOB: [id number], [birth year]   Age/ Sex: 62 y.o., female    PCP: Wendi Maya, NP   Reason for Endocrinology Evaluation: Type 1 Diabetes Mellitus     Date of Initial Endocrinology Visit: 07/16/2022    PATIENT IDENTIFIER: [name] is a 62 y.o. female with a past medical history of Dm, HTn and dyslipidemia . The patient presented for initial endocrinology clinic visit on 07/16/2022  for consultative assistance with her diabetes management.    HPI: [name] was    Diagnosed with DM at age 49 Prior Medications tried/Intolerance: she used to be on insulin pump and dexcom  Hemoglobin A1c has ranged from 8.2% in 2023, peaking at 12% .  On her initial visit to our clinic she had an A1c of 8.2%, she was on Lantus and NovoLog which we adjusted the dose   SUBJECTIVE:   During the last visit (07/16/2022): A1c 8.2%   Today (12/10/22): [name] is here for a follow up on diabetes management.  She  checks her  blood sugars *** times daily. The patient has *** had hypoglycemic episodes since the last clinic visit, which typically occur *** x / - most often occuring ***. The patient is *** symptomatic with these episodes, with symptoms of {symptoms; hypoglycemia:9084048}.    She has chronic nausea but no diarrhea    HOME DIABETES REGIMEN: Lantus 30 units daily  Novolog 12 units TIDQAC NovoLog 4 units with snacks Correction factor :NovoLog (BG -130/35) Levothyroxine 125 mcg daily   Statin: yes ACE-I/ARB: no Prior Diabetic Education: yes   METER DOWNLOAD SUMMARY: did not bring    DIABETIC COMPLICATIONS: Microvascular complications:  Neuropathy Denies: CKD  Last eye exam: Completed 2023  Macrovascular complications:   Denies: CAD, PVD, CVA   PAST HISTORY: Past Medical History:  Past Medical History:  Diagnosis Date   Anxiety    Arthritis    rheumatoid    BV (bacterial vaginosis) 05/23/2015   Chronic abdominal pain     COPD (chronic obstructive pulmonary disease) (HCC)    Diabetes mellitus    type I   GERD (gastroesophageal reflux disease)    Hiatal hernia    Hyperlipidemia    Hypertension    Hypothyroid    Polyneuropathy in diabetes ([place])    PUD (peptic ulcer disease)    ? remote past, no reports from [place] or [facility], states possibly Dr. Laural Golden.    Seizures ([place])    had in the past with hypoglycemia.   Stroke [facility])    no deficits.   Trichimoniasis    Unspecified polyarthropathy or polyarthritis, site unspecified    Vaginal discharge 05/23/2015   Past Surgical History:  Past Surgical History:  Procedure Laterality Date   ABDOMINAL HYSTERECTOMY     APPENDECTOMY     BLADDER REPAIR     CHOLECYSTECTOMY N/A 06/19/2017   Procedure: LAPAROSCOPIC CHOLECYSTECTOMY;  Surgeon: Aviva Signs, MD;  Location: [facility];  Service: General;  Laterality: N/A;   COLONOSCOPY N/A 06/01/2015   [id number] hemorrhoid diminutive rectosigmoid otherwise normal   ESOPHAGOGASTRODUODENOSCOPY     in remote past, unclear who performed. No op notes from [facility] or [facility] (pt thought Dr. Laural Golden)   ESOPHAGOGASTRODUODENOSCOPY  02/12/2012   Dr. Gala Romney:  Normal esophagus status post 44 F dilation. Small hiatal hernia. mild chronic gastritis   ESOPHAGOGASTRODUODENOSCOPY N/A 02/21/2014   [id number] exudative reflux esophagitis-and + Candida esophagitis.   ESOPHAGOGASTRODUODENOSCOPY N/A 06/01/2015   [id number] HH otherwise  normal   TONSILLECTOMY      Social History:  reports that she has been smoking cigarettes. She has a 8.50 pack-year smoking history. She has never used smokeless tobacco. She reports that she does not drink alcohol and does not use drugs. Family History:  Family History  Problem Relation Age of Onset   Hypertension Mother    Liver cancer Mother        remission for 2 years   Cancer Mother        lung   Cancer Father    Thyroid disease Father    Hypertension Father    Diabetes Father    Dementia Father    Heart  attack Father    Hyperlipidemia Father    Other Sister        liver trouble   Diabetes Brother    Hyperthyroidism Brother    Dementia Maternal Grandmother    Diabetes Paternal Grandmother    Diabetes Paternal Grandfather    Colon cancer Neg Hx      HOME MEDICATIONS: Allergies as of 12/11/2022       Reactions   Phenytoin Anaphylaxis   Dilantin-Reaction: SJS (STEVENS JOHNSON SYNDROME)   Sulfonamide Derivatives Nausea And Vomiting   Tetracyclines & Related Nausea And Vomiting        Medication List        Accurate as of December 10, 2022  4:07 PM. If you have any questions, ask your nurse or doctor.          Accu-Chek Guide test strip Generic drug: glucose blood Check blood sugar 1 time daily   Advair Diskus 250-50 MCG/ACT Aepb Generic drug: fluticasone-salmeterol 1 puff 2 (two) times daily.   albuterol (2.5 MG/3ML) 0.083% nebulizer solution Commonly known as: PROVENTIL Take 2.5 mg by nebulization every 6 (six) hours as needed for wheezing or shortness of breath.   albuterol 108 (90 Base) MCG/ACT inhaler Commonly known as: VENTOLIN HFA Inhale 2 puffs into the lungs every 6 (six) hours as needed for wheezing or shortness of breath.   Dexcom G6 Receiver Devi 1 Device by Does not apply route as directed.   Dexcom G6 Sensor Misc 1 Device by Does not apply route as directed.   Dexcom G6 Transmitter Misc 1 Device by Does not apply route as directed.   dexlansoprazole 60 MG capsule Commonly known as: DEXILANT Take 60 mg by mouth daily.   fluticasone 50 MCG/ACT nasal spray Commonly known as: FLONASE Place 2 sprays into both nostrils daily.   gabapentin 100 MG capsule Commonly known as: NEURONTIN Take 200 mg by mouth 4 (four) times daily.   insulin aspart 100 UNIT/ML injection Commonly known as: novoLOG Max daily 60 units per pump   Lantus SoloStar 100 UNIT/ML Solostar Pen Generic drug: insulin glargine Inject 34 Units into the skin at bedtime.    levothyroxine 125 MCG tablet Commonly known as: SYNTHROID Take 1 tablet (125 mcg total) by mouth daily before breakfast.   pravastatin 20 MG tablet Commonly known as: PRAVACHOL Take 20 mg by mouth daily.   promethazine 25 MG tablet Commonly known as: PHENERGAN Take 1 tablet (25 mg total) by mouth every 6 (six) hours as needed for nausea or vomiting.   traZODone 100 MG tablet Commonly known as: DESYREL Take 100 mg by mouth at bedtime.         ALLERGIES: Allergies  Allergen Reactions   Phenytoin Anaphylaxis    Dilantin-Reaction: SJS (STEVENS JOHNSON SYNDROME)   Sulfonamide Derivatives Nausea  And Vomiting   Tetracyclines & Related Nausea And Vomiting     REVIEW OF SYSTEMS: A comprehensive ROS was conducted with the patient and is negative except as per HPI   OBJECTIVE:   VITAL SIGNS: There were no vitals taken for this visit.   PHYSICAL EXAM:  General: Pt appears well and is in NAD  Neck: General: Supple without adenopathy or carotid bruits. Thyroid: Thyroid size normal.  No goiter or nodules appreciated.   Lungs: Clear with good BS bilat with no rales, rhonchi, or wheezes  Heart: RRR   Abdomen:  soft, nontender  Extremities:  Lower extremities - No pretibial edema.  Neuro: MS is good with appropriate affect, pt is alert and Ox3     DATA REVIEWED:  Lab Results  Component Value Date   HGBA1C 8.2 (A) 07/16/2022   HGBA1C 9.4 04/11/2021   HGBA1C 9.6 (H) 03/07/2018    Latest Reference Range & Units 07/16/22 13:23  Sodium 135 - 145 mEq/L 139  Potassium 3.5 - 5.1 mEq/L 4.8  Chloride 96 - 112 mEq/L 104  CO2 19 - 32 mEq/L 27  Glucose 70 - 99 mg/dL 187 (H)  BUN 6 - 23 mg/dL 15  Creatinine 0.40 - 1.20 mg/dL 0.72  Calcium 8.4 - 10.5 mg/dL 9.7  GFR >60.00 mL/min 89.31     Latest Reference Range & Units 07/16/22 13:23  Total CHOL/HDL Ratio  2  Cholesterol 0 - 200 mg/dL 169  HDL Cholesterol >39.00 mg/dL 77.80  LDL (calc) 0 - 99 mg/dL 75  NonHDL  90.89   Triglycerides 0.0 - 149.0 mg/dL 79.0  VLDL 0.0 - 40.0 mg/dL 15.8    Latest Reference Range & Units 07/16/22 13:23  TSH 0.35 - 5.50 uIU/mL 0.51    ASSESSMENT / PLAN / RECOMMENDATIONS:   1) Type 1 Diabetes Mellitus, poorly controlled, With neuropathic  complications - Most recent A1c of 8.2 %. Goal A1c < 7.0 %.     Patient endorses hypoglycemia overnight, will reduce basal insulin Will increase prandial dose of insulin, she will also be given a prandial dose for snacks Patient will be provided with a correction scale to use before each meal She is interested in insulin pump technology, she was given the phone number to the tandem helpline to start the process, she was also referred to our CDE for training A prescription for Dexcom has been faxed to her DME supplier  MEDICATIONS: Decrease Lantus 30 units daily Increase NovoLog 12 units 3 times daily before every meal Take NovoLog 4 units with snacks Start correction factor :NovoLog (BG -130/35)  EDUCATION / INSTRUCTIONS: BG monitoring instructions: Patient is instructed to check her blood sugars 3 times a day, before each meal. Call [facility] if: BG persistently < 70  I reviewed the Rule of 15 for the treatment of hypoglycemia in detail with the patient. Literature supplied.   2) Diabetic complications:  Eye: Does not have known diabetic retinopathy.  Neuro/ Feet: Does  have known diabetic peripheral neuropathy. Renal: Patient does not have known baseline CKD. She is not on an ACEI/ARB at present.  3) Dyslipidemia:  -LDL and TG at goal  Medication Continue pravastatin 20 mg daily  4)Hypothyroidism:  -TSH within normal range   Medications Continue levothyroxine 125 mcg daily   Follow-up in 4 months  Signed electronically by: Mack Guise, MD  [facility]  [facility] [address] [place], [place] [address] Phone: [phone] FAX: [phone]  CC: Wendi Maya, NP [place] [address] Phone: [phone]  Fax: [phone]    Return to Endocrinology clinic as below: Future Appointments  Date Time Provider [place]  12/11/2022 11:30 AM Shakir Petrosino, Melanie Crazier, MD [facility] None

## 2022-12-11 ENCOUNTER — Encounter: Payer: Self-pay | Admitting: Internal Medicine

## 2022-12-11 ENCOUNTER — Ambulatory Visit (INDEPENDENT_AMBULATORY_CARE_PROVIDER_SITE_OTHER): Payer: Medicare HMO | Admitting: Internal Medicine

## 2022-12-11 VITALS — BP 122/80 | HR 89 | Ht 64.0 in | Wt 184.0 lb

## 2022-12-11 DIAGNOSIS — E1042 Type 1 diabetes mellitus with diabetic polyneuropathy: Secondary | ICD-10-CM | POA: Diagnosis not present

## 2022-12-11 DIAGNOSIS — E782 Mixed hyperlipidemia: Secondary | ICD-10-CM | POA: Diagnosis not present

## 2022-12-11 DIAGNOSIS — E1065 Type 1 diabetes mellitus with hyperglycemia: Secondary | ICD-10-CM

## 2022-12-11 DIAGNOSIS — E039 Hypothyroidism, unspecified: Secondary | ICD-10-CM

## 2022-12-11 LAB — POCT GLYCOSYLATED HEMOGLOBIN (HGB A1C): Hemoglobin A1C: 7.7 % — AB (ref 4.0–5.6)

## 2022-12-11 MED ORDER — LEVOTHYROXINE SODIUM 112 MCG PO TABS: 112.0000 ug | ORAL_TABLET | Freq: Every day | ORAL | 3 refills | Status: AC

## 2022-12-11 NOTE — Patient Instructions (Addendum)

## 2022-12-25 ENCOUNTER — Telehealth: Payer: Self-pay | Admitting: Pharmacy Technician

## 2022-12-25 ENCOUNTER — Other Ambulatory Visit (HOSPITAL_COMMUNITY): Payer: Self-pay

## 2022-12-25 NOTE — Telephone Encounter (Signed)
Pharmacy Patient Advocate Encounter  Received notification from [facility] that prior authorization for Novolog is required/requested.  Per Test Claim: filled 11/26/22 for a 90 day supply. Too soon to fill.

## 2023-02-04 ENCOUNTER — Telehealth: Payer: Self-pay | Admitting: Nutrition

## 2023-02-04 ENCOUNTER — Other Ambulatory Visit: Payer: Self-pay | Admitting: Internal Medicine

## 2023-02-04 ENCOUNTER — Telehealth: Payer: Self-pay | Admitting: Internal Medicine

## 2023-02-04 MED ORDER — INSULIN GLARGINE SOLOSTAR 100 UNIT/ML ~~LOC~~ SOPN: 24.0000 [IU] | PEN_INJECTOR | Freq: Every day | SUBCUTANEOUS | 4 refills | Status: DC

## 2023-02-04 MED ORDER — INSULIN PEN NEEDLE 32G X 4 MM MISC: 1.0000 | Freq: Four times a day (QID) | 3 refills | Status: DC

## 2023-02-04 MED ORDER — INSULIN ASPART 100 UNIT/ML IJ SOLN: INTRAMUSCULAR | 3 refills | Status: DC

## 2023-02-04 MED ORDER — "INSULIN SYRINGE 31G X 5/16"" 0.3 ML MISC": 1.0000 | Freq: Three times a day (TID) | 3 refills | Status: DC

## 2023-02-04 NOTE — Telephone Encounter (Signed)
Patient was given her pump settings.  She was told to call tandem to help her put these settings into her pump. The soonest she can see me is next Tuesday. Appt. scheduled for Tuesday to link the new pump and make sure settings are entered correctly.  Patient also reports that she is running short of insulin by 4 days every month.  "Please call in a new prescription for more insulin"

## 2023-02-04 NOTE — Telephone Encounter (Signed)
Patient is calling to say that her Tandem T-Slim is not working.  Patient has been talking with Tandem and they are sending her a new pump and it should arrive tomorrow.  Patient did say that she received a new cord today from Tandem, but her current pump is still not charging.  Patient wants to know what she needs to do in the meantime about how to get the readings from her current pump that is not working at all.

## 2023-02-04 NOTE — Telephone Encounter (Signed)
I went and spoke with Bonita Quin, she states she is going to call the patient to help her set up the new pump and to help her with the settings and etc. Please advise on what she needs to do about her insulin in the meantime.

## 2023-02-05 MED ORDER — INSULIN GLARGINE SOLOSTAR 100 UNIT/ML ~~LOC~~ SOPN: 24.0000 [IU] | PEN_INJECTOR | Freq: Every day | SUBCUTANEOUS | 4 refills | Status: AC

## 2023-02-05 MED ORDER — INSULIN PEN NEEDLE 32G X 4 MM MISC: 1.0000 | Freq: Four times a day (QID) | 3 refills | Status: AC

## 2023-02-05 MED ORDER — "INSULIN SYRINGE 31G X 5/16"" 0.3 ML MISC": 1.0000 | Freq: Three times a day (TID) | 3 refills | Status: AC

## 2023-02-05 MED ORDER — INSULIN ASPART 100 UNIT/ML IJ SOLN: INTRAMUSCULAR | 3 refills | Status: AC

## 2023-02-05 NOTE — Telephone Encounter (Signed)
Patient has been advised and will make medication adjustment. States that she should be getting her pump today

## 2023-02-05 NOTE — Addendum Note (Signed)
Addended by: Lisabeth Pick on: 02/05/2023 10:02 AM   Modules accepted: Orders

## 2023-02-10 ENCOUNTER — Encounter: Payer: Medicare HMO | Attending: Internal Medicine | Admitting: Nutrition

## 2023-02-10 DIAGNOSIS — E1042 Type 1 diabetes mellitus with diabetic polyneuropathy: Secondary | ICD-10-CM | POA: Insufficient documentation

## 2023-02-11 NOTE — Progress Notes (Signed)
Patient is here today to set up her new pump, because the old one stopped working.  She reports that she can not do this on her own.  Settings were taken from her last office note and transferred to her pump.  Her phone does not support the Tandem T-connect app, or the Dexcom G6 mobil app., which can link this to the cloud. She was encouraged to get a new pump and the app names were written down, so that when she goes to the phone store, she will be able to purchase the phone that supports these apps.   Dexcom G6 sensor was started and linked to her pump, and the pump was put in control IQ.  She had no final questions

## 2023-02-11 NOTE — Patient Instructions (Signed)
Call when you get a new phone

## 2023-06-17 ENCOUNTER — Ambulatory Visit: Payer: Medicare HMO | Admitting: Internal Medicine

## 2023-06-23 ENCOUNTER — Ambulatory Visit: Payer: Medicare HMO | Admitting: Internal Medicine

## 2023-06-23 NOTE — Progress Notes (Deleted)
Name: [name]  MRN/ DOB: [id number], September 20, 1961   Age/ Sex: 62 y.o., female    PCP: Janean Sark, NP   Reason for Endocrinology Evaluation: Type 1 Diabetes Mellitus     Date of Initial Endocrinology Visit: 07/16/2022    PATIENT IDENTIFIER: [name] is a 62 y.o. female with a past medical history of Dm, HTn and dyslipidemia , Lupus . The patient presented for initial endocrinology clinic visit on 07/16/2022  for consultative assistance with her diabetes management.    HPI: [name] was    Diagnosed with DM at age 89 Prior Medications tried/Intolerance: she used to be on insulin pump and dexcom  Hemoglobin A1c has ranged from 8.2% in 2023, peaking at 12% .  On her initial visit to our clinic she had an A1c of 8.2%, she was on Lantus and NovoLog which we adjusted the dose   SUBJECTIVE:   During the last visit (07/16/2022): A1c 8.2%   Today (06/23/23): [name] is here for a follow up on diabetes management.  She  checks her  blood sugars multiple  times daily. The patient has not  had hypoglycemic episodes since the last clinic visit, which typically occur at night  . The patient is  symptomatic with these episodes.   She has chronic nausea but no diarrhea  The pt with seizure disorder, and she attributes some of the seizures are triggered by hypoglycemia, patient does follow-up with neurology  Since her last visit here, the patient has establish care with rheumatology and is currently being treated for lupus erythematosus  This patient with type 1 diabetes is treated with Tandem  (insulin pump). During the visit the pump basal and bolus doses were reviewed including carb/insulin rations and supplemental doses. The clinical list was updated. The glucose meter download was reviewed in detail to determine if the current pump settings are providing the best glycemic control without excessive hypoglycemia.  Pump and meter download:      Pump    Tandem Settings   Insulin type   Novlog    Basal rate       0000  0.8 u/h               I:C ratio       0000 1:12                  Sensitivity       0000  40      Goal       0000   120          Type & Model of Pump: Tandem Insulin Type: Currently using NovoLog.  There is no height or weight on file to calculate BMI.  PUMP STATISTICS: Unable to download   HOME DIABETES REGIMEN: NovoLog Levothyroxine 112 mcg daily      Statin: yes ACE-I/ARB: no Prior Diabetic Education: yes   METER DOWNLOAD SUMMARY: did not bring    DIABETIC COMPLICATIONS: Microvascular complications:  Neuropathy Denies: CKD  Last eye exam: Completed 2023  Macrovascular complications:   Denies: CAD, PVD, CVA   PAST HISTORY: Past Medical History:  Past Medical History:  Diagnosis Date   Anxiety    Arthritis    rheumatoid    BV (bacterial vaginosis) 05/23/2015   Chronic abdominal pain    COPD (chronic obstructive pulmonary disease) (HCC)    Diabetes mellitus    type I   GERD (gastroesophageal reflux disease)    Hiatal  hernia    Hyperlipidemia    Hypertension    Hypothyroid    Polyneuropathy in diabetes (HCC)    PUD (peptic ulcer disease)    ? remote past, no reports from [place] or [facility], states possibly Dr. Karilyn Cota.    Seizures (HCC)    had in the past with hypoglycemia.   Stroke [facility])    no deficits.   Trichimoniasis    Unspecified polyarthropathy or polyarthritis, site unspecified    Vaginal discharge 05/23/2015   Past Surgical History:  Past Surgical History:  Procedure Laterality Date   ABDOMINAL HYSTERECTOMY     APPENDECTOMY     BLADDER REPAIR     CHOLECYSTECTOMY N/A 06/19/2017   Procedure: LAPAROSCOPIC CHOLECYSTECTOMY;  Surgeon: Franky Macho, MD;  Location: [facility];  Service: General;  Laterality: N/A;   COLONOSCOPY N/A 06/01/2015   [id number] hemorrhoid diminutive rectosigmoid otherwise normal   ESOPHAGOGASTRODUODENOSCOPY     in remote past, unclear who  performed. No op notes from [facility] or [facility] (pt thought Dr. Karilyn Cota)   ESOPHAGOGASTRODUODENOSCOPY  02/12/2012   Dr. Jena Gauss:  Normal esophagus status post 58 F dilation. Small hiatal hernia. mild chronic gastritis   ESOPHAGOGASTRODUODENOSCOPY N/A 02/21/2014   [id number] exudative reflux esophagitis-and + Candida esophagitis.   ESOPHAGOGASTRODUODENOSCOPY N/A 06/01/2015   [id number] HH otherwise normal   TONSILLECTOMY      Social History:  reports that she has been smoking cigarettes. She started smoking about 36 years ago. She has a 8.5 pack-year smoking history. She has never used smokeless tobacco. She reports that she does not drink alcohol and does not use drugs. Family History:  Family History  Problem Relation Age of Onset   Hypertension Mother    Liver cancer Mother        remission for 2 years   Cancer Mother        lung   Cancer Father    Thyroid disease Father    Hypertension Father    Diabetes Father    Dementia Father    Heart attack Father    Hyperlipidemia Father    Other Sister        liver trouble   Diabetes Brother    Hyperthyroidism Brother    Dementia Maternal Grandmother    Diabetes Paternal Grandmother    Diabetes Paternal Grandfather    Colon cancer Neg Hx      HOME MEDICATIONS: Allergies as of 06/23/2023       Reactions   Phenytoin Anaphylaxis   Dilantin-Reaction: SJS (STEVENS JOHNSON SYNDROME)   Sulfonamide Derivatives Nausea And Vomiting   Tetracyclines & Related Nausea And Vomiting        Medication List        Accurate as of June 23, 2023 11:34 AM. If you have any questions, ask your nurse or doctor.          Accu-Chek Guide test strip Generic drug: glucose blood Check blood sugar 1 time daily   Advair Diskus 250-50 MCG/ACT Aepb Generic drug: fluticasone-salmeterol 1 puff 2 (two) times daily.   albuterol (2.5 MG/3ML) 0.083% nebulizer solution Commonly known as: PROVENTIL Take 2.5 mg by nebulization every 6 (six) hours as needed  for wheezing or shortness of breath.   albuterol 108 (90 Base) MCG/ACT inhaler Commonly known as: VENTOLIN HFA Inhale 2 puffs into the lungs every 6 (six) hours as needed for wheezing or shortness of breath.   Dexcom G6 Receiver Devi 1 Device by Does not apply route as directed.   Dexcom  G6 Sensor Misc 1 Device by Does not apply route as directed.   Dexcom G6 Transmitter Misc 1 Device by Does not apply route as directed.   dexlansoprazole 60 MG capsule Commonly known as: DEXILANT Take 60 mg by mouth daily.   fluticasone 50 MCG/ACT nasal spray Commonly known as: FLONASE Place 2 sprays into both nostrils daily.   gabapentin 300 MG capsule Commonly known as: NEURONTIN Take 300 mg by mouth 3 (three) times daily.   insulin aspart 100 UNIT/ML injection Commonly known as: novoLOG Max daily 60 units per pump   Insulin Glargine Solostar 100 UNIT/ML Solostar Pen Commonly known as: LANTUS Inject 24 Units into the skin daily in the afternoon.   Insulin Pen Needle 32G X 4 MM Misc 1 Device by Does not apply route in the morning, at noon, in the evening, and at bedtime.   INSULIN SYRINGE .3CC/31GX5/16" 31G X 5/16" 0.3 ML Misc 1 Device by Does not apply route 3 (three) times daily.   levothyroxine 112 MCG tablet Commonly known as: SYNTHROID Take 1 tablet (112 mcg total) by mouth daily.   pravastatin 20 MG tablet Commonly known as: PRAVACHOL Take 20 mg by mouth daily.   predniSONE 10 MG tablet Commonly known as: DELTASONE Take 10 mg by mouth daily with breakfast.   promethazine 25 MG tablet Commonly known as: PHENERGAN Take 1 tablet (25 mg total) by mouth every 6 (six) hours as needed for nausea or vomiting.   traZODone 100 MG tablet Commonly known as: DESYREL Take 100 mg by mouth at bedtime.         ALLERGIES: Allergies  Allergen Reactions   Phenytoin Anaphylaxis    Dilantin-Reaction: SJS (STEVENS JOHNSON SYNDROME)   Sulfonamide Derivatives Nausea And Vomiting    Tetracyclines & Related Nausea And Vomiting     REVIEW OF SYSTEMS: A comprehensive ROS was conducted with the patient and is negative except as per HPI   OBJECTIVE:   VITAL SIGNS: There were no vitals taken for this visit.   PHYSICAL EXAM:  General: Pt appears well and is in NAD  Lungs: Clear with good BS bilat with no rales, rhonchi, or wheezes  Heart: RRR   Extremities:  Lower extremities - No pretibial edema.  Neuro: MS is good with appropriate affect, pt is alert and Ox3   DM Foot Exam 12/11/2022  The skin of the feet is intact without sores or ulcerations. The pedal pulses are 2+ on right and 2+ on left. The sensation is decreased to a screening 5.07, 10 gram monofilament on the left    DATA REVIEWED:  Lab Results  Component Value Date   HGBA1C 7.7 (A) 12/11/2022   HGBA1C 8.2 (A) 07/16/2022   HGBA1C 9.4 04/11/2021    Latest Reference Range & Units 07/16/22 13:23  Sodium 135 - 145 mEq/L 139  Potassium 3.5 - 5.1 mEq/L 4.8  Chloride 96 - 112 mEq/L 104  CO2 19 - 32 mEq/L 27  Glucose 70 - 99 mg/dL 161 (H)  BUN 6 - 23 mg/dL 15  Creatinine 0.96 - 0.45 mg/dL 4.09  Calcium 8.4 - 81.1 mg/dL 9.7  GFR >91.47 mL/min 89.31     Latest Reference Range & Units 07/16/22 13:23  Total CHOL/HDL Ratio  2  Cholesterol 0 - 200 mg/dL 829  HDL Cholesterol >56.21 mg/dL 30.86  LDL (calc) 0 - 99 mg/dL 75  NonHDL  57.84  Triglycerides 0.0 - 149.0 mg/dL 69.6  VLDL 0.0 - 29.5 mg/dL 15.8  Latest Reference Range & Units 07/16/22 13:23  TSH 0.35 - 5.50 uIU/mL 0.51    ASSESSMENT / PLAN / RECOMMENDATIONS:   1) Type 1 Diabetes Mellitus, Sub-Optimally controlled, With neuropathic  complications - Most recent A1c of 7.7%. Goal A1c < 7.0 %.     -Her A1c has trended down but the patient endorses hypoglycemia, unfortunately we have not been able to download her tandem pump nor CGM as she is not linked , I have advised the patient to contact the tandem and help walk her through the  process needed so we are able to download her information -She does not have the Dexcom app on her phone either, I was unable to see when checked she does have hypoglycemia, but based on her description I will reduce her basal rate, as well as correction factor -Unfortunately, she has not been bolusing for carbohydrates, we discussed the importance of bolusing for carbohydrates since she has type I DM    Pump   Tandem Settings   Insulin type   Novlog    Basal rate       0000  0.8 u/h               I:C ratio       0000 1:12                  Sensitivity       0000  40      Goal       0000   120        MEDICATIONS: NovoLog EDUCATION / INSTRUCTIONS: BG monitoring instructions: Patient is instructed to check her blood sugars 3 times a day, before each meal. Call [facility] if: BG persistently < 70  I reviewed the Rule of 15 for the treatment of hypoglycemia in detail with the patient. Literature supplied.   2) Diabetic complications:  Eye: Does not have known diabetic retinopathy.  Neuro/ Feet: Does  have known diabetic peripheral neuropathy. Renal: Patient does not have known baseline CKD. She is not on an ACEI/ARB at present.  3) Dyslipidemia:  -LDL and TG at goal  Medication Continue pravastatin 20 mg daily  4)Hypothyroidism:  -Patient had labs through rheumatology approximately a month ago and was told that her TSH is low and the dose needs to be adjusted -These records are not available but I will reduce her levothyroxine as below  Medications Stop levothyroxine 125 mcg daily Start levothyroxine 112 mcg daily   Follow-up in 4 months  Signed electronically by: Lyndle Herrlich, MD  [facility]  [facility] [address] [place] [place], [place] [address] Phone: [phone] FAX: [phone]   CC: Janean Sark, NP [address] [place] [address] Phone: [phone]  Fax:  [phone]    Return to Endocrinology clinic as below: Future Appointments  Date Time Provider Department Center  06/23/2023  2:00 PM Grier Vu, Konrad Dolores, MD [facility] None

## 2023-08-17 NOTE — Progress Notes (Deleted)
Name: [name]  MRN/ DOB: [id number], [birth year]   Age/ Sex: 62 y.o., female    PCP: Janean Sark, NP   Reason for Endocrinology Evaluation: Type 1 Diabetes Mellitus     Date of Initial Endocrinology Visit: 07/16/2022    PATIENT IDENTIFIER: [name] is a 62 y.o. female with a past medical history of Dm, HTn and dyslipidemia , Lupus . The patient presented for initial endocrinology clinic visit on 07/16/2022  for consultative assistance with her diabetes management.    HPI: [name] was    Diagnosed with DM at age 52 Prior Medications tried/Intolerance: she used to be on insulin pump and dexcom  Hemoglobin A1c has ranged from 8.2% in 2023, peaking at 12% .  On her initial visit to our clinic she had an A1c of 8.2%, she was on Lantus and NovoLog which we adjusted the dose   SUBJECTIVE:   During the last visit (12/11/2022): A1c 7.7%   Today (08/17/23): [name] is here for a follow up on diabetes management.  She  checks her  blood sugars multiple  times daily. The patient has not  had hypoglycemic episodes since the last clinic visit, which typically occur at night  . The patient is  symptomatic with these episodes.   She has chronic nausea but no diarrhea  The pt with seizure disorder, and she attributes some of the seizures are triggered by hypoglycemia, patient does follow-up with neurology  Since her last visit here, the patient has establish care with rheumatology and is currently being treated for lupus erythematosus  This patient with type 1 diabetes is treated with Tandem  (insulin pump). During the visit the pump basal and bolus doses were reviewed including carb/insulin rations and supplemental doses. The clinical list was updated. The glucose meter download was reviewed in detail to determine if the current pump settings are providing the best glycemic control without excessive hypoglycemia.  Pump and meter download:       Pump    Tandem Settings   Insulin type   Novlog    Basal rate       0000  0.8 u/h               I:C ratio       0000 1:12                  Sensitivity       0000  40      Goal       0000   120          Type & Model of Pump: Tandem Insulin Type: Currently using NovoLog.  There is no height or weight on file to calculate BMI.  PUMP STATISTICS: Unable to download   HOME DIABETES REGIMEN: NovoLog Levothyroxine 112 mcg daily      Statin: yes ACE-I/ARB: no Prior Diabetic Education: yes   METER DOWNLOAD SUMMARY: did not bring    DIABETIC COMPLICATIONS: Microvascular complications:  Neuropathy Denies: CKD  Last eye exam: Completed 2023  Macrovascular complications:   Denies: CAD, PVD, CVA   PAST HISTORY: Past Medical History:  Past Medical History:  Diagnosis Date   Anxiety    Arthritis    rheumatoid    BV (bacterial vaginosis) 05/23/2015   Chronic abdominal pain    COPD (chronic obstructive pulmonary disease) (HCC)    Diabetes mellitus    type I   GERD (gastroesophageal reflux disease)  Hiatal hernia    Hyperlipidemia    Hypertension    Hypothyroid    Polyneuropathy in diabetes (HCC)    PUD (peptic ulcer disease)    ? remote past, no reports from [facility] or [facility], states possibly Dr. Karilyn Cota.    Seizures (HCC)    had in the past with hypoglycemia.   Stroke [facility])    no deficits.   Trichimoniasis    Unspecified polyarthropathy or polyarthritis, site unspecified    Vaginal discharge 05/23/2015   Past Surgical History:  Past Surgical History:  Procedure Laterality Date   ABDOMINAL HYSTERECTOMY     APPENDECTOMY     BLADDER REPAIR     CHOLECYSTECTOMY N/A 06/19/2017   Procedure: LAPAROSCOPIC CHOLECYSTECTOMY;  Surgeon: Franky Macho, MD;  Location: [facility];  Service: General;  Laterality: N/A;   COLONOSCOPY N/A 06/01/2015   [id number] hemorrhoid diminutive rectosigmoid otherwise normal   ESOPHAGOGASTRODUODENOSCOPY     in remote past, unclear who  performed. No op notes from [facility] or [facility] (pt thought Dr. Karilyn Cota)   ESOPHAGOGASTRODUODENOSCOPY  02/12/2012   Dr. Jena Gauss:  Normal esophagus status post 30 F dilation. Small hiatal hernia. mild chronic gastritis   ESOPHAGOGASTRODUODENOSCOPY N/A 02/21/2014   [id number]:[id number] exudative reflux esophagitis-and + Candida esophagitis.   ESOPHAGOGASTRODUODENOSCOPY N/A 06/01/2015   GNF:[id number] HH otherwise normal   TONSILLECTOMY      Social History:  reports that she has been smoking cigarettes. She started smoking about 36 years ago. She has a 8.5 pack-year smoking history. She has never used smokeless tobacco. She reports that she does not drink alcohol and does not use drugs. Family History:  Family History  Problem Relation Age of Onset   Hypertension Mother    Liver cancer Mother        remission for 2 years   Cancer Mother        lung   Cancer Father    Thyroid disease Father    Hypertension Father    Diabetes Father    Dementia Father    Heart attack Father    Hyperlipidemia Father    Other Sister        liver trouble   Diabetes Brother    Hyperthyroidism Brother    Dementia Maternal Grandmother    Diabetes Paternal Grandmother    Diabetes Paternal Grandfather    Colon cancer Neg Hx      HOME MEDICATIONS: Allergies as of 08/18/2023       Reactions   Phenytoin Anaphylaxis   Dilantin-Reaction: SJS (STEVENS JOHNSON SYNDROME)   Sulfonamide Derivatives Nausea And Vomiting   Tetracyclines & Related Nausea And Vomiting        Medication List        Accurate as of August 17, 2023 10:42 AM. If you have any questions, ask your nurse or doctor.          Accu-Chek Guide test strip Generic drug: glucose blood Check blood sugar 1 time daily   Advair Diskus 250-50 MCG/ACT Aepb Generic drug: fluticasone-salmeterol 1 puff 2 (two) times daily.   albuterol (2.5 MG/3ML) 0.083% nebulizer solution Commonly known as: PROVENTIL Take 2.5 mg by nebulization every 6 (six) hours as needed  for wheezing or shortness of breath.   albuterol 108 (90 Base) MCG/ACT inhaler Commonly known as: VENTOLIN HFA Inhale 2 puffs into the lungs every 6 (six) hours as needed for wheezing or shortness of breath.   Dexcom G6 Receiver Devi 1 Device by Does not apply route as directed.  Dexcom G6 Sensor Misc 1 Device by Does not apply route as directed.   Dexcom G6 Transmitter Misc 1 Device by Does not apply route as directed.   dexlansoprazole 60 MG capsule Commonly known as: DEXILANT Take 60 mg by mouth daily.   fluticasone 50 MCG/ACT nasal spray Commonly known as: FLONASE Place 2 sprays into both nostrils daily.   gabapentin 300 MG capsule Commonly known as: NEURONTIN Take 300 mg by mouth 3 (three) times daily.   insulin aspart 100 UNIT/ML injection Commonly known as: novoLOG Max daily 60 units per pump   Insulin Glargine Solostar 100 UNIT/ML Solostar Pen Commonly known as: LANTUS Inject 24 Units into the skin daily in the afternoon.   Insulin Pen Needle 32G X 4 MM Misc 1 Device by Does not apply route in the morning, at noon, in the evening, and at bedtime.   INSULIN SYRINGE .3CC/31GX5/16" 31G X 5/16" 0.3 ML Misc 1 Device by Does not apply route 3 (three) times daily.   levothyroxine 112 MCG tablet Commonly known as: SYNTHROID Take 1 tablet (112 mcg total) by mouth daily.   pravastatin 20 MG tablet Commonly known as: PRAVACHOL Take 20 mg by mouth daily.   predniSONE 10 MG tablet Commonly known as: DELTASONE Take 10 mg by mouth daily with breakfast.   promethazine 25 MG tablet Commonly known as: PHENERGAN Take 1 tablet (25 mg total) by mouth every 6 (six) hours as needed for nausea or vomiting.   traZODone 100 MG tablet Commonly known as: DESYREL Take 100 mg by mouth at bedtime.         ALLERGIES: Allergies  Allergen Reactions   Phenytoin Anaphylaxis    Dilantin-Reaction: SJS (STEVENS JOHNSON SYNDROME)   Sulfonamide Derivatives Nausea And Vomiting    Tetracyclines & Related Nausea And Vomiting     REVIEW OF SYSTEMS: A comprehensive ROS was conducted with the patient and is negative except as per HPI   OBJECTIVE:   VITAL SIGNS: There were no vitals taken for this visit.   PHYSICAL EXAM:  General: Pt appears well and is in NAD  Lungs: Clear with good BS bilat with no rales, rhonchi, or wheezes  Heart: RRR   Extremities:  Lower extremities - No pretibial edema.  Neuro: MS is good with appropriate affect, pt is alert and Ox3   DM Foot Exam 12/11/2022  The skin of the feet is intact without sores or ulcerations. The pedal pulses are 2+ on right and 2+ on left. The sensation is decreased to a screening 5.07, 10 gram monofilament on the left    DATA REVIEWED:  Lab Results  Component Value Date   HGBA1C 7.7 (A) 12/11/2022   HGBA1C 8.2 (A) 07/16/2022   HGBA1C 9.4 04/11/2021    Latest Reference Range & Units 07/16/22 13:23  Sodium 135 - 145 mEq/L 139  Potassium 3.5 - 5.1 mEq/L 4.8  Chloride 96 - 112 mEq/L 104  CO2 19 - 32 mEq/L 27  Glucose 70 - 99 mg/dL 295 (H)  BUN 6 - 23 mg/dL 15  Creatinine 6.21 - 3.08 mg/dL 6.57  Calcium 8.4 - 84.6 mg/dL 9.7  GFR >96.29 mL/min 89.31     Latest Reference Range & Units 07/16/22 13:23  Total CHOL/HDL Ratio  2  Cholesterol 0 - 200 mg/dL 528  HDL Cholesterol >41.32 mg/dL 44.01  LDL (calc) 0 - 99 mg/dL 75  NonHDL  02.72  Triglycerides 0.0 - 149.0 mg/dL 53.6  VLDL 0.0 - 64.4 mg/dL 15.8  Latest Reference Range & Units 07/16/22 13:23  TSH 0.35 - 5.50 uIU/mL 0.51    ASSESSMENT / PLAN / RECOMMENDATIONS:   1) Type 1 Diabetes Mellitus, Sub-Optimally controlled, With neuropathic  complications - Most recent A1c of 7.7%. Goal A1c < 7.0 %.     -Her A1c has trended down but the patient endorses hypoglycemia, unfortunately we have not been able to download her tandem pump nor CGM as she is not linked , I have advised the patient to contact the tandem and help walk her through the  process needed so we are able to download her information -She does not have the Dexcom app on her phone either, I was unable to see when checked she does have hypoglycemia, but based on her description I will reduce her basal rate, as well as correction factor -Unfortunately, she has not been bolusing for carbohydrates, we discussed the importance of bolusing for carbohydrates since she has type I DM    Pump   Tandem Settings   Insulin type   Novlog    Basal rate       0000  0.8 u/h               I:C ratio       0000 1:12                  Sensitivity       0000  40      Goal       0000   120        MEDICATIONS: NovoLog EDUCATION / INSTRUCTIONS: BG monitoring instructions: Patient is instructed to check her blood sugars 3 times a day, before each meal. Call [facility] if: BG persistently < 70  I reviewed the Rule of 15 for the treatment of hypoglycemia in detail with the patient. Literature supplied.   2) Diabetic complications:  Eye: Does not have known diabetic retinopathy.  Neuro/ Feet: Does  have known diabetic peripheral neuropathy. Renal: Patient does not have known baseline CKD. She is not on an ACEI/ARB at present.  3) Dyslipidemia:  -LDL and TG at goal  Medication Continue pravastatin 20 mg daily  4)Hypothyroidism:  -Patient had labs through rheumatology approximately a month ago and was told that her TSH is low and the dose needs to be adjusted -These records are not available but I will reduce her levothyroxine as below  Medications Stop levothyroxine 125 mcg daily Start levothyroxine 112 mcg daily   Follow-up in 4 months  Signed electronically by: Lyndle Herrlich, MD  [facility]  [facility] [address] [place] [place], [place] [address] Phone: [phone] FAX: [phone]   CC: Janean Sark, NP [address] [place] [address] Phone: [phone]  Fax:  [phone]    Return to Endocrinology clinic as below: Future Appointments  Date Time Provider Department Center  08/18/2023 12:10 PM Xiamara Hulet, Konrad Dolores, MD [facility] None

## 2023-08-18 ENCOUNTER — Ambulatory Visit: Payer: Medicare HMO | Admitting: Internal Medicine

## 2023-08-18 ENCOUNTER — Encounter: Payer: Self-pay | Admitting: Internal Medicine

## 2023-09-07 ENCOUNTER — Other Ambulatory Visit: Payer: Self-pay

## 2023-10-29 ENCOUNTER — Telehealth: Payer: Self-pay

## 2023-10-29 ENCOUNTER — Encounter: Payer: Self-pay | Admitting: Internal Medicine

## 2023-10-29 ENCOUNTER — Ambulatory Visit: Payer: Medicare HMO | Admitting: Internal Medicine

## 2023-10-29 NOTE — Telephone Encounter (Signed)
 Dismissal letter has been placed up front to mail to patient

## 2023-10-29 NOTE — Progress Notes (Deleted)
 Name: [name]  MRN/ DOB: [id number], November 11, 1960   Age/ Sex: 63 y.o., female    PCP: Estelle Delon SQUIBB, NP   Reason for Endocrinology Evaluation: Type 1 Diabetes Mellitus     Date of Initial Endocrinology Visit: 07/16/2022    PATIENT IDENTIFIER: [name] is a 63 y.o. female with a past medical history of Dm, HTn and dyslipidemia , Lupus . The patient presented for initial endocrinology clinic visit on 07/16/2022  for consultative assistance with her diabetes management.    HPI: [name] was    Diagnosed with DM at age 69 Prior Medications tried/Intolerance: she used to be on insulin  pump and dexcom  Hemoglobin A1c has ranged from 8.2% in 2023, peaking at 12% .  On her initial visit to our clinic she had an A1c of 8.2%, she was on Lantus  and NovoLog  which we adjusted the dose   SUBJECTIVE:   During the last visit (12/11/2022): A1c 8.2%   Today (10/29/23): [name] is here for a follow up on diabetes management.  She  has NOT been to our clinic in 11 months. She checks her  blood sugars multiple  times daily. The patient has not  had hypoglycemic episodes since the last clinic visit, which typically occur at night  . The patient is  symptomatic with these episodes.   She has chronic nausea but no diarrhea  The pt with seizure disorder, and she attributes some of the seizures are triggered by hypoglycemia, patient does follow-up with neurology  Since her last visit here, the patient has establish care with rheumatology and is currently being treated for lupus erythematosus  This patient with type 1 diabetes is treated with Tandem  (insulin  pump). During the visit the pump basal and bolus doses were reviewed including carb/insulin  rations and supplemental doses. The clinical list was updated. The glucose meter download was reviewed in detail to determine if the current pump settings are providing the best glycemic control without excessive  hypoglycemia.  Pump and meter download:      Pump   Tandem Settings   Insulin  type   Novlog    Basal rate       0000  0.8 u/h               I:C ratio       0000 1:12                  Sensitivity       0000  40      Goal       0000   120          Type & Model of Pump: Tandem Insulin  Type: Currently using NovoLog .  There is no height or weight on file to calculate BMI.  PUMP STATISTICS: Unable to download   HOME DIABETES REGIMEN: NovoLog  Levothyroxine  112 mcg daily      Statin: yes ACE-I/ARB: no Prior Diabetic Education: yes   METER DOWNLOAD SUMMARY: did not bring    DIABETIC COMPLICATIONS: Microvascular complications:  Neuropathy Denies: CKD  Last eye exam: Completed 2023  Macrovascular complications:   Denies: CAD, PVD, CVA   PAST HISTORY: Past Medical History:  Past Medical History:  Diagnosis Date   Anxiety    Arthritis    rheumatoid    BV (bacterial vaginosis) 05/23/2015   Chronic abdominal pain    COPD (chronic obstructive pulmonary disease) (HCC)    Diabetes mellitus    type I  GERD (gastroesophageal reflux disease)    Hiatal hernia    Hyperlipidemia    Hypertension    Hypothyroid    Polyneuropathy in diabetes (HCC)    PUD (peptic ulcer disease)    ? remote past, no reports from [facility] or [facility], states possibly Dr. Golda.    Seizures (HCC)    had in the past with hypoglycemia.   Stroke [facility])    no deficits.   Trichimoniasis    Unspecified polyarthropathy or polyarthritis, site unspecified    Vaginal discharge 05/23/2015   Past Surgical History:  Past Surgical History:  Procedure Laterality Date   ABDOMINAL HYSTERECTOMY     APPENDECTOMY     BLADDER REPAIR     CHOLECYSTECTOMY N/A 06/19/2017   Procedure: LAPAROSCOPIC CHOLECYSTECTOMY;  Surgeon: Mavis Anes, MD;  Location: [facility];  Service: General;  Laterality: N/A;   COLONOSCOPY N/A 06/01/2015   [id number] hemorrhoid diminutive rectosigmoid otherwise normal    ESOPHAGOGASTRODUODENOSCOPY     in remote past, unclear who performed. No op notes from [facility] or [facility] (pt thought Dr. Golda)   ESOPHAGOGASTRODUODENOSCOPY  02/12/2012   Dr. Shaaron:  Normal esophagus status post 64 F dilation. Small hiatal hernia. mild chronic gastritis   ESOPHAGOGASTRODUODENOSCOPY N/A 02/21/2014   MFM:Dzczmz exudative reflux esophagitis-and + Candida esophagitis.   ESOPHAGOGASTRODUODENOSCOPY N/A 06/01/2015   MFM:dfjoo HH otherwise normal   TONSILLECTOMY      Social History:  reports that she has been smoking cigarettes. She started smoking about 36 years ago. She has a 8.5 pack-year smoking history. She has never used smokeless tobacco. She reports that she does not drink alcohol  and does not use drugs. Family History:  Family History  Problem Relation Age of Onset   Hypertension Mother    Liver cancer Mother        remission for 2 years   Cancer Mother        lung   Cancer Father    Thyroid  disease Father    Hypertension Father    Diabetes Father    Dementia Father    Heart attack Father    Hyperlipidemia Father    Other Sister        liver trouble   Diabetes Brother    Hyperthyroidism Brother    Dementia Maternal Grandmother    Diabetes Paternal Grandmother    Diabetes Paternal Grandfather    Colon cancer Neg Hx      HOME MEDICATIONS: Allergies as of 10/29/2023       Reactions   Phenytoin Anaphylaxis   Dilantin-Reaction: SJS (STEVENS JOHNSON SYNDROME)   Sulfonamide Derivatives Nausea And Vomiting   Tetracyclines & Related Nausea And Vomiting        Medication List        Accurate as of October 29, 2023  7:28 AM. If you have any questions, ask your nurse or doctor.          Accu-Chek Guide test strip Generic drug: glucose blood Check blood sugar 1 time daily   Advair Diskus 250-50 MCG/ACT Aepb Generic drug: fluticasone-salmeterol 1 puff 2 (two) times daily.   albuterol  (2.5 MG/3ML) 0.083% nebulizer solution Commonly known as:  PROVENTIL  Take 2.5 mg by nebulization every 6 (six) hours as needed for wheezing or shortness of breath.   albuterol  108 (90 Base) MCG/ACT inhaler Commonly known as: VENTOLIN  HFA Inhale 2 puffs into the lungs every 6 (six) hours as needed for wheezing or shortness of breath.   Dexcom G6 Receiver Devi 1 Device by  Does not apply route as directed.   Dexcom G6 Sensor Misc 1 Device by Does not apply route as directed.   Dexcom G6 Transmitter Misc 1 Device by Does not apply route as directed.   dexlansoprazole 60 MG capsule Commonly known as: DEXILANT Take 60 mg by mouth daily.   fluticasone 50 MCG/ACT nasal spray Commonly known as: FLONASE Place 2 sprays into both nostrils daily.   gabapentin  300 MG capsule Commonly known as: NEURONTIN  Take 300 mg by mouth 3 (three) times daily.   insulin  aspart 100 UNIT/ML injection Commonly known as: novoLOG  Max daily 60 units per pump   Insulin  Glargine Solostar 100 UNIT/ML Solostar Pen Commonly known as: LANTUS  Inject 24 Units into the skin daily in the afternoon.   Insulin  Pen Needle 32G X 4 MM Misc 1 Device by Does not apply route in the morning, at noon, in the evening, and at bedtime.   INSULIN  SYRINGE .3CC/31GX5/16 31G X 5/16 0.3 ML Misc 1 Device by Does not apply route 3 (three) times daily.   levothyroxine  112 MCG tablet Commonly known as: SYNTHROID  Take 1 tablet (112 mcg total) by mouth daily.   pravastatin 20 MG tablet Commonly known as: PRAVACHOL Take 20 mg by mouth daily.   predniSONE 10 MG tablet Commonly known as: DELTASONE Take 10 mg by mouth daily with breakfast.   promethazine  25 MG tablet Commonly known as: PHENERGAN  Take 1 tablet (25 mg total) by mouth every 6 (six) hours as needed for nausea or vomiting.   traZODone  100 MG tablet Commonly known as: DESYREL  Take 100 mg by mouth at bedtime.         ALLERGIES: Allergies  Allergen Reactions   Phenytoin Anaphylaxis    Dilantin-Reaction: SJS  (STEVENS JOHNSON SYNDROME)   Sulfonamide Derivatives Nausea And Vomiting   Tetracyclines & Related Nausea And Vomiting     REVIEW OF SYSTEMS: A comprehensive ROS was conducted with the patient and is negative except as per HPI   OBJECTIVE:   VITAL SIGNS: There were no vitals taken for this visit.   PHYSICAL EXAM:  General: Pt appears well and is in NAD  Lungs: Clear with good BS bilat with no rales, rhonchi, or wheezes  Heart: RRR   Extremities:  Lower extremities - No pretibial edema.  Neuro: MS is good with appropriate affect, pt is alert and Ox3   DM Foot Exam 12/11/2022  The skin of the feet is intact without sores or ulcerations. The pedal pulses are 2+ on right and 2+ on left. The sensation is decreased to a screening 5.07, 10 gram monofilament on the left    DATA REVIEWED:  Lab Results  Component Value Date   HGBA1C 7.7 (A) 12/11/2022   HGBA1C 8.2 (A) 07/16/2022   HGBA1C 9.4 04/11/2021    Latest Reference Range & Units 07/16/22 13:23  Sodium 135 - 145 mEq/L 139  Potassium 3.5 - 5.1 mEq/L 4.8  Chloride 96 - 112 mEq/L 104  CO2 19 - 32 mEq/L 27  Glucose 70 - 99 mg/dL 812 (H)  BUN 6 - 23 mg/dL 15  Creatinine 9.59 - 8.79 mg/dL 9.27  Calcium 8.4 - 89.4 mg/dL 9.7  GFR >39.99 mL/min 89.31     Latest Reference Range & Units 07/16/22 13:23  Total CHOL/HDL Ratio  2  Cholesterol 0 - 200 mg/dL 830  HDL Cholesterol >60.99 mg/dL 22.19  LDL (calc) 0 - 99 mg/dL 75  NonHDL  09.10  Triglycerides 0.0 - 149.0 mg/dL 79.0  VLDL 0.0 - 40.0 mg/dL 84.1    Latest Reference Range & Units 07/16/22 13:23  TSH 0.35 - 5.50 uIU/mL 0.51    ASSESSMENT / PLAN / RECOMMENDATIONS:   1) Type 1 Diabetes Mellitus, Sub-Optimally controlled, With neuropathic  complications - Most recent A1c of 7.7%. Goal A1c < 7.0 %.     -Her A1c has trended down but the patient endorses hypoglycemia, unfortunately we have not been able to download her tandem pump nor CGM as she is not linked , I have  advised the patient to contact the tandem and help walk her through the process needed so we are able to download her information -She does not have the Dexcom app on her phone either, I was unable to see when checked she does have hypoglycemia, but based on her description I will reduce her basal rate, as well as correction factor -Unfortunately, she has not been bolusing for carbohydrates, we discussed the importance of bolusing for carbohydrates since she has type I DM    Pump   Tandem Settings   Insulin  type   Novlog    Basal rate       0000  0.8 u/h               I:C ratio       0000 1:12                  Sensitivity       0000  40      Goal       0000   120        MEDICATIONS: NovoLog  EDUCATION / INSTRUCTIONS: BG monitoring instructions: Patient is instructed to check her blood sugars 3 times a day, before each meal. Call [facility] if: BG persistently < 70  I reviewed the Rule of 15 for the treatment of hypoglycemia in detail with the patient. Literature supplied.   2) Diabetic complications:  Eye: Does not have known diabetic retinopathy.  Neuro/ Feet: Does  have known diabetic peripheral neuropathy. Renal: Patient does not have known baseline CKD. She is not on an ACEI/ARB at present.  3) Dyslipidemia:  -LDL and TG at goal  Medication Continue pravastatin 20 mg daily  4)Hypothyroidism:  -Patient had labs through rheumatology approximately a month ago and was told that her TSH is low and the dose needs to be adjusted -These records are not available but I will reduce her levothyroxine  as below  Medications  Levothyroxine  112 mcg daily   Follow-up in 4 months  Signed electronically by: Stefano Redgie Butts, MD  [facility]  [facility] [address] [place] [place], [place] [address] Phone: [phone] FAX: [phone]   CC: Estelle Delon SQUIBB, NP [address] [place]  [address] Phone: [phone]  Fax: [phone]    Return to Endocrinology clinic as below: Future Appointments  Date Time Provider Department Center  10/29/2023 11:10 AM Lyliana Dicenso, Donell Redgie, MD [facility] None

## 2023-11-02 ENCOUNTER — Ambulatory Visit: Payer: Medicare HMO | Admitting: Internal Medicine

## 2023-12-16 ENCOUNTER — Ambulatory Visit: Payer: Medicare HMO | Admitting: Internal Medicine

## 2024-05-20 ENCOUNTER — Other Ambulatory Visit: Payer: Self-pay | Admitting: Internal Medicine

## 2024-07-15 ENCOUNTER — Other Ambulatory Visit: Payer: Self-pay | Admitting: Internal Medicine

## 2024-09-27 DIAGNOSIS — Z1231 Encounter for screening mammogram for malignant neoplasm of breast: Secondary | ICD-10-CM
